# Patient Record
Sex: Male | Born: 1955
Health system: Southern US, Community
[De-identification: ages and names within clinical notes are randomized; demographics above are authoritative.]

## PROBLEM LIST (undated history)

## (undated) DIAGNOSIS — E785 Hyperlipidemia, unspecified: Secondary | ICD-10-CM

## (undated) DIAGNOSIS — H5440 Blindness, one eye, unspecified eye: Secondary | ICD-10-CM

## (undated) DIAGNOSIS — K219 Gastro-esophageal reflux disease without esophagitis: Secondary | ICD-10-CM

## (undated) DIAGNOSIS — G629 Polyneuropathy, unspecified: Secondary | ICD-10-CM

## (undated) DIAGNOSIS — R7402 Elevation of levels of lactic acid dehydrogenase (LDH): Secondary | ICD-10-CM

## (undated) DIAGNOSIS — Z87442 Personal history of urinary calculi: Secondary | ICD-10-CM

## (undated) DIAGNOSIS — R7401 Elevation of levels of liver transaminase levels: Secondary | ICD-10-CM

## (undated) DIAGNOSIS — M7501 Adhesive capsulitis of right shoulder: Secondary | ICD-10-CM

## (undated) DIAGNOSIS — M1990 Unspecified osteoarthritis, unspecified site: Secondary | ICD-10-CM

## (undated) DIAGNOSIS — S83232A Complex tear of medial meniscus, current injury, left knee, initial encounter: Secondary | ICD-10-CM

## (undated) DIAGNOSIS — M549 Dorsalgia, unspecified: Secondary | ICD-10-CM

## (undated) DIAGNOSIS — T7840XA Allergy, unspecified, initial encounter: Secondary | ICD-10-CM

## (undated) DIAGNOSIS — I10 Essential (primary) hypertension: Secondary | ICD-10-CM

## (undated) DIAGNOSIS — Z973 Presence of spectacles and contact lenses: Secondary | ICD-10-CM

## (undated) DIAGNOSIS — R740 Nonspecific elevation of levels of transaminase and lactic acid dehydrogenase [LDH]: Secondary | ICD-10-CM

## (undated) HISTORY — DX: Personal history of urinary calculi: Z87.442

## (undated) HISTORY — DX: Gastro-esophageal reflux disease without esophagitis: K21.9

## (undated) HISTORY — DX: Hyperlipidemia, unspecified: E78.5

## (undated) HISTORY — DX: Dorsalgia, unspecified: M54.9

## (undated) HISTORY — DX: Elevation of levels of lactic acid dehydrogenase (LDH): R74.02

## (undated) HISTORY — DX: Elevation of levels of liver transaminase levels: R74.01

## (undated) HISTORY — DX: Allergy, unspecified, initial encounter: T78.40XA

## (undated) HISTORY — DX: Nonspecific elevation of levels of transaminase and lactic acid dehydrogenase (ldh): R74.0

## (undated) HISTORY — PX: SHOULDER ARTHROSCOPY WITH ROTATOR CUFF REPAIR: SHX5685

## (undated) HISTORY — DX: Essential (primary) hypertension: I10

## (undated) HISTORY — PX: KNEE ARTHROSCOPY W/ MENISCAL REPAIR: SHX1877

## (undated) HISTORY — PX: COLONOSCOPY: SHX174

## (undated) HISTORY — PX: COSMETIC SURGERY: SHX468

## (undated) HISTORY — PX: SPINE SURGERY: SHX786

## (undated) HISTORY — PX: EYE SURGERY: SHX253

## (undated) HISTORY — PX: UPPER GI ENDOSCOPY: SHX6162

---
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
## 1988-12-06 HISTORY — PX: LUMBAR DISC SURGERY: SHX700

## 1998-10-22 ENCOUNTER — Ambulatory Visit (HOSPITAL_COMMUNITY): Admission: RE | Admit: 1998-10-22 | Discharge: 1998-10-22 | Payer: Self-pay | Admitting: Gastroenterology

## 1999-11-25 ENCOUNTER — Ambulatory Visit (HOSPITAL_COMMUNITY): Admission: RE | Admit: 1999-11-25 | Discharge: 1999-11-25 | Payer: Self-pay | Admitting: Neurosurgery

## 1999-11-25 ENCOUNTER — Encounter: Payer: Self-pay | Admitting: Neurosurgery

## 2000-08-31 ENCOUNTER — Encounter: Payer: Self-pay | Admitting: Gastroenterology

## 2000-08-31 ENCOUNTER — Ambulatory Visit (HOSPITAL_COMMUNITY): Admission: RE | Admit: 2000-08-31 | Discharge: 2000-08-31 | Payer: Self-pay | Admitting: Gastroenterology

## 2001-05-15 ENCOUNTER — Emergency Department (HOSPITAL_COMMUNITY): Admission: EM | Admit: 2001-05-15 | Discharge: 2001-05-15 | Payer: Self-pay | Admitting: Emergency Medicine

## 2001-05-15 ENCOUNTER — Encounter: Payer: Self-pay | Admitting: *Deleted

## 2001-05-16 ENCOUNTER — Encounter: Payer: Self-pay | Admitting: Urology

## 2001-05-16 ENCOUNTER — Encounter: Admission: RE | Admit: 2001-05-16 | Discharge: 2001-05-16 | Payer: Self-pay | Admitting: Urology

## 2001-05-17 ENCOUNTER — Emergency Department (HOSPITAL_COMMUNITY): Admission: EM | Admit: 2001-05-17 | Discharge: 2001-05-17 | Payer: Self-pay | Admitting: Emergency Medicine

## 2001-05-18 ENCOUNTER — Ambulatory Visit (HOSPITAL_COMMUNITY): Admission: RE | Admit: 2001-05-18 | Discharge: 2001-05-18 | Payer: Self-pay | Admitting: Urology

## 2001-05-18 ENCOUNTER — Encounter: Payer: Self-pay | Admitting: Urology

## 2001-05-20 ENCOUNTER — Encounter: Payer: Self-pay | Admitting: Emergency Medicine

## 2001-05-20 ENCOUNTER — Emergency Department (HOSPITAL_COMMUNITY): Admission: EM | Admit: 2001-05-20 | Discharge: 2001-05-20 | Payer: Self-pay | Admitting: *Deleted

## 2001-05-22 ENCOUNTER — Encounter: Admission: RE | Admit: 2001-05-22 | Discharge: 2001-05-22 | Payer: Self-pay | Admitting: Urology

## 2001-05-22 ENCOUNTER — Encounter: Payer: Self-pay | Admitting: Urology

## 2001-05-24 ENCOUNTER — Encounter: Payer: Self-pay | Admitting: Urology

## 2001-05-24 ENCOUNTER — Ambulatory Visit (HOSPITAL_COMMUNITY): Admission: RE | Admit: 2001-05-24 | Discharge: 2001-05-24 | Payer: Self-pay | Admitting: Urology

## 2001-06-15 ENCOUNTER — Encounter: Payer: Self-pay | Admitting: Urology

## 2001-06-15 ENCOUNTER — Encounter: Admission: RE | Admit: 2001-06-15 | Discharge: 2001-06-15 | Payer: Self-pay | Admitting: Urology

## 2005-01-25 ENCOUNTER — Ambulatory Visit: Payer: Self-pay | Admitting: Internal Medicine

## 2005-01-27 ENCOUNTER — Ambulatory Visit: Payer: Self-pay | Admitting: Internal Medicine

## 2005-04-27 ENCOUNTER — Ambulatory Visit: Payer: Self-pay | Admitting: Internal Medicine

## 2005-06-02 ENCOUNTER — Ambulatory Visit: Payer: Self-pay | Admitting: Internal Medicine

## 2005-06-30 ENCOUNTER — Ambulatory Visit: Payer: Self-pay | Admitting: Internal Medicine

## 2005-09-21 ENCOUNTER — Ambulatory Visit: Payer: Self-pay | Admitting: Internal Medicine

## 2005-09-29 ENCOUNTER — Ambulatory Visit: Payer: Self-pay | Admitting: Internal Medicine

## 2005-10-08 ENCOUNTER — Ambulatory Visit: Payer: Self-pay

## 2006-01-26 ENCOUNTER — Ambulatory Visit: Payer: Self-pay | Admitting: Family Medicine

## 2006-05-05 ENCOUNTER — Emergency Department (HOSPITAL_COMMUNITY): Admission: EM | Admit: 2006-05-05 | Discharge: 2006-05-05 | Payer: Self-pay | Admitting: Emergency Medicine

## 2006-05-30 ENCOUNTER — Ambulatory Visit: Payer: Self-pay | Admitting: Internal Medicine

## 2006-06-01 ENCOUNTER — Ambulatory Visit: Payer: Self-pay | Admitting: Internal Medicine

## 2006-08-29 ENCOUNTER — Ambulatory Visit: Payer: Self-pay | Admitting: Internal Medicine

## 2006-09-16 ENCOUNTER — Ambulatory Visit: Payer: Self-pay | Admitting: Internal Medicine

## 2006-12-08 ENCOUNTER — Ambulatory Visit: Payer: Self-pay | Admitting: Internal Medicine

## 2007-02-21 ENCOUNTER — Ambulatory Visit: Payer: Self-pay | Admitting: Internal Medicine

## 2007-02-21 LAB — CONVERTED CEMR LAB
AST: 29 units/L (ref 0–37)
Albumin: 3.8 g/dL (ref 3.5–5.2)
Alkaline Phosphatase: 44 units/L (ref 39–117)
Bilirubin, Direct: 0.2 mg/dL (ref 0.0–0.3)
Cholesterol: 138 mg/dL (ref 0–200)
Hgb A1c MFr Bld: 7.5 % — ABNORMAL HIGH (ref 4.6–6.0)
Potassium: 4.9 meq/L (ref 3.5–5.1)
Total Protein: 7.1 g/dL (ref 6.0–8.3)
Triglycerides: 92 mg/dL (ref 0–149)

## 2007-02-22 ENCOUNTER — Ambulatory Visit: Payer: Self-pay | Admitting: Internal Medicine

## 2007-05-18 ENCOUNTER — Ambulatory Visit: Payer: Self-pay | Admitting: Internal Medicine

## 2007-05-18 LAB — CONVERTED CEMR LAB
ALT: 58 units/L — ABNORMAL HIGH (ref 0–40)
AST: 38 units/L — ABNORMAL HIGH (ref 0–37)
Albumin: 3.9 g/dL (ref 3.5–5.2)
Alkaline Phosphatase: 39 units/L (ref 39–117)
BUN: 17 mg/dL (ref 6–23)
Bilirubin, Direct: 0.2 mg/dL (ref 0.0–0.3)
Cholesterol: 160 mg/dL (ref 0–200)
Creatinine, Ser: 1 mg/dL (ref 0.4–1.5)
HDL: 51.6 mg/dL (ref >39.0)
Hgb A1c MFr Bld: 7.4 % — ABNORMAL HIGH (ref 4.6–6.0)
LDL Cholesterol: 96 mg/dL (ref 0–99)
Potassium: 4.7 meq/L (ref 3.5–5.1)
Total Bilirubin: 1.4 mg/dL — ABNORMAL HIGH (ref 0.3–1.2)
Total CHOL/HDL Ratio: 3.1
Total Protein: 7.3 g/dL (ref 6.0–8.3)
Triglycerides: 62 mg/dL (ref 0–149)
VLDL: 12 mg/dL (ref 0–40)

## 2007-05-24 ENCOUNTER — Ambulatory Visit: Payer: Self-pay | Admitting: Internal Medicine

## 2007-08-11 ENCOUNTER — Ambulatory Visit: Payer: Self-pay | Admitting: Internal Medicine

## 2007-08-11 LAB — CONVERTED CEMR LAB
HDL: 48.8 mg/dL (ref >39.0)
Hgb A1c MFr Bld: 6.9 %
LDL Cholesterol: 79 mg/dL (ref 0–99)
Potassium: 4 meq/L (ref 3.5–5.1)
Total Protein: 7.1 g/dL (ref 6.0–8.3)
Triglycerides: 53 mg/dL (ref 0–149)

## 2007-08-23 ENCOUNTER — Ambulatory Visit: Payer: Self-pay | Admitting: Internal Medicine

## 2007-09-22 ENCOUNTER — Ambulatory Visit: Payer: Self-pay | Admitting: Internal Medicine

## 2007-09-22 ENCOUNTER — Encounter: Payer: Self-pay | Admitting: Internal Medicine

## 2007-09-22 DIAGNOSIS — R7401 Elevation of levels of liver transaminase levels: Secondary | ICD-10-CM | POA: Insufficient documentation

## 2007-09-22 DIAGNOSIS — E785 Hyperlipidemia, unspecified: Secondary | ICD-10-CM | POA: Insufficient documentation

## 2007-09-22 DIAGNOSIS — Z87442 Personal history of urinary calculi: Secondary | ICD-10-CM | POA: Insufficient documentation

## 2007-09-22 DIAGNOSIS — R740 Nonspecific elevation of levels of transaminase and lactic acid dehydrogenase [LDH]: Secondary | ICD-10-CM

## 2007-09-22 DIAGNOSIS — K219 Gastro-esophageal reflux disease without esophagitis: Secondary | ICD-10-CM | POA: Insufficient documentation

## 2007-09-22 DIAGNOSIS — M549 Dorsalgia, unspecified: Secondary | ICD-10-CM | POA: Insufficient documentation

## 2007-09-22 DIAGNOSIS — R7402 Elevation of levels of lactic acid dehydrogenase (LDH): Secondary | ICD-10-CM | POA: Insufficient documentation

## 2007-09-22 LAB — CONVERTED CEMR LAB
ALT: 49 units/L (ref 0–53)
AST: 32 units/L (ref 0–37)
Albumin: 3.7 g/dL (ref 3.5–5.2)
Bilirubin, Direct: 0.2 mg/dL (ref 0.0–0.3)
HDL: 44.3 mg/dL (ref >39.0)
LDL Cholesterol: 75 mg/dL (ref 0–99)
VLDL: 27 mg/dL (ref 0–40)

## 2007-11-29 ENCOUNTER — Ambulatory Visit: Payer: Self-pay | Admitting: Internal Medicine

## 2007-11-29 DIAGNOSIS — E119 Type 2 diabetes mellitus without complications: Secondary | ICD-10-CM | POA: Insufficient documentation

## 2007-11-29 LAB — CONVERTED CEMR LAB
ALT: 89 units/L — ABNORMAL HIGH (ref 0–53)
Alkaline Phosphatase: 47 units/L (ref 39–117)
BUN: 17 mg/dL (ref 6–23)
Bilirubin, Direct: 0.2 mg/dL (ref 0.0–0.3)
Creatinine, Ser: 0.9 mg/dL (ref 0.4–1.5)
HDL: 46.5 mg/dL (ref >39.0)
Total Bilirubin: 1.2 mg/dL (ref 0.3–1.2)
Total Protein: 7.5 g/dL (ref 6.0–8.3)
VLDL: 21 mg/dL (ref 0–40)

## 2007-12-05 ENCOUNTER — Ambulatory Visit: Payer: Self-pay | Admitting: Internal Medicine

## 2008-02-29 ENCOUNTER — Ambulatory Visit: Payer: Self-pay | Admitting: Internal Medicine

## 2008-02-29 LAB — CONVERTED CEMR LAB
BUN: 14 mg/dL (ref 6–23)
Calcium: 8.9 mg/dL (ref 8.4–10.5)
Glucose, Bld: 137 mg/dL — ABNORMAL HIGH (ref 70–99)
HDL: 47.3 mg/dL (ref >39.0)
Total CHOL/HDL Ratio: 4.7

## 2008-03-01 ENCOUNTER — Ambulatory Visit: Payer: Self-pay | Admitting: Internal Medicine

## 2008-03-12 ENCOUNTER — Encounter: Payer: Self-pay | Admitting: Internal Medicine

## 2008-03-13 ENCOUNTER — Encounter: Admission: RE | Admit: 2008-03-13 | Discharge: 2008-03-13 | Payer: Self-pay | Admitting: Gastroenterology

## 2008-04-02 ENCOUNTER — Encounter: Payer: Self-pay | Admitting: Internal Medicine

## 2008-05-16 ENCOUNTER — Encounter: Payer: Self-pay | Admitting: Internal Medicine

## 2008-06-21 ENCOUNTER — Ambulatory Visit: Payer: Self-pay | Admitting: Internal Medicine

## 2008-06-21 LAB — CONVERTED CEMR LAB
BUN: 13 mg/dL (ref 6–23)
Bilirubin, Direct: 0.2 mg/dL (ref 0.0–0.3)
CO2: 30 meq/L (ref 19–32)
Calcium: 9.3 mg/dL (ref 8.4–10.5)
Chloride: 104 meq/L (ref 96–112)
Creatinine, Ser: 0.9 mg/dL (ref 0.4–1.5)
GFR calc Af Amer: 114 mL/min
GFR calc non Af Amer: 94 mL/min
Glucose, Bld: 137 mg/dL — ABNORMAL HIGH (ref 70–99)
Total Bilirubin: 1.4 mg/dL — ABNORMAL HIGH (ref 0.3–1.2)
Total Protein: 7.1 g/dL (ref 6.0–8.3)
Triglycerides: 131 mg/dL (ref 0–149)

## 2008-06-26 ENCOUNTER — Encounter: Payer: Self-pay | Admitting: Internal Medicine

## 2008-06-27 ENCOUNTER — Ambulatory Visit: Payer: Self-pay | Admitting: Internal Medicine

## 2008-10-14 ENCOUNTER — Ambulatory Visit: Payer: Self-pay | Admitting: Internal Medicine

## 2008-10-14 LAB — CONVERTED CEMR LAB
ALT: 79 units/L — ABNORMAL HIGH (ref 0–53)
Alkaline Phosphatase: 45 units/L (ref 39–117)
Calcium: 9.1 mg/dL (ref 8.4–10.5)
Chloride: 104 meq/L (ref 96–112)
Cholesterol: 218 mg/dL (ref 0–200)
Creatinine, Ser: 1 mg/dL (ref 0.4–1.5)
Direct LDL: 142.5 mg/dL
GFR calc Af Amer: 100 mL/min
GFR calc non Af Amer: 83 mL/min
HDL: 47 mg/dL (ref >39.0)
Sodium: 142 meq/L (ref 135–145)
Total Protein: 7.6 g/dL (ref 6.0–8.3)
Triglycerides: 120 mg/dL (ref 0–149)

## 2008-10-22 ENCOUNTER — Ambulatory Visit: Payer: Self-pay | Admitting: Internal Medicine

## 2008-10-28 ENCOUNTER — Ambulatory Visit: Payer: Self-pay | Admitting: Internal Medicine

## 2008-10-28 ENCOUNTER — Telehealth: Payer: Self-pay | Admitting: Internal Medicine

## 2008-11-04 ENCOUNTER — Telehealth: Payer: Self-pay | Admitting: Internal Medicine

## 2008-11-18 DIAGNOSIS — I10 Essential (primary) hypertension: Secondary | ICD-10-CM | POA: Insufficient documentation

## 2008-11-28 ENCOUNTER — Ambulatory Visit: Payer: Self-pay | Admitting: Internal Medicine

## 2009-01-20 ENCOUNTER — Ambulatory Visit: Payer: Self-pay | Admitting: Internal Medicine

## 2009-01-20 LAB — CONVERTED CEMR LAB
ALT: 61 units/L — ABNORMAL HIGH (ref 0–53)
HDL: 51.5 mg/dL (ref >39.0)
LDL Cholesterol: 119 mg/dL — ABNORMAL HIGH (ref 0–99)
Total Bilirubin: 1 mg/dL (ref 0.3–1.2)

## 2009-01-22 ENCOUNTER — Encounter: Payer: Self-pay | Admitting: Internal Medicine

## 2009-01-22 ENCOUNTER — Ambulatory Visit: Payer: Self-pay | Admitting: Endocrinology

## 2009-01-24 ENCOUNTER — Ambulatory Visit: Payer: Self-pay

## 2009-01-24 ENCOUNTER — Encounter: Payer: Self-pay | Admitting: Endocrinology

## 2009-01-27 ENCOUNTER — Encounter: Payer: Self-pay | Admitting: Internal Medicine

## 2009-01-27 ENCOUNTER — Ambulatory Visit: Payer: Self-pay | Admitting: Cardiology

## 2009-01-30 ENCOUNTER — Telehealth: Payer: Self-pay | Admitting: Endocrinology

## 2009-06-11 ENCOUNTER — Ambulatory Visit: Payer: Self-pay | Admitting: Internal Medicine

## 2009-06-11 LAB — CONVERTED CEMR LAB
BUN: 16 mg/dL (ref 6–23)
CO2: 32 meq/L (ref 19–32)
Creatinine, Ser: 0.9 mg/dL (ref 0.4–1.5)
HDL: 48.3 mg/dL (ref >39.00)
LDL Cholesterol: 94 mg/dL (ref 0–99)
Potassium: 4.6 meq/L (ref 3.5–5.1)
Total CHOL/HDL Ratio: 3
VLDL: 22 mg/dL (ref 0.0–40.0)

## 2009-06-19 ENCOUNTER — Ambulatory Visit: Payer: Self-pay | Admitting: Internal Medicine

## 2009-06-20 DIAGNOSIS — E1149 Type 2 diabetes mellitus with other diabetic neurological complication: Secondary | ICD-10-CM | POA: Insufficient documentation

## 2009-07-07 ENCOUNTER — Ambulatory Visit: Payer: Self-pay | Admitting: Internal Medicine

## 2009-07-14 ENCOUNTER — Encounter: Payer: Self-pay | Admitting: Internal Medicine

## 2009-10-10 ENCOUNTER — Ambulatory Visit: Payer: Self-pay | Admitting: Internal Medicine

## 2009-10-10 LAB — CONVERTED CEMR LAB
ALT: 50 units/L (ref 0–53)
AST: 34 units/L (ref 0–37)
Albumin: 4 g/dL (ref 3.5–5.2)
Alkaline Phosphatase: 42 units/L (ref 39–117)
LDL Cholesterol: 107 mg/dL — ABNORMAL HIGH (ref 0–99)
Total Bilirubin: 1.4 mg/dL — ABNORMAL HIGH (ref 0.3–1.2)
Total CHOL/HDL Ratio: 4
VLDL: 20 mg/dL (ref 0.0–40.0)

## 2009-10-13 ENCOUNTER — Ambulatory Visit: Payer: Self-pay | Admitting: Internal Medicine

## 2009-10-13 LAB — CONVERTED CEMR LAB
Hemoglobin, Urine: NEGATIVE
Leukocytes, UA: NEGATIVE
Specific Gravity, Urine: <=1.005 (ref 1.000–1.030)
Total Protein, Urine: NEGATIVE mg/dL
pH: 6 (ref 5.0–8.0)

## 2009-10-15 ENCOUNTER — Telehealth: Payer: Self-pay | Admitting: Internal Medicine

## 2009-10-20 ENCOUNTER — Telehealth: Payer: Self-pay | Admitting: Internal Medicine

## 2009-11-19 ENCOUNTER — Telehealth: Payer: Self-pay | Admitting: Internal Medicine

## 2010-02-06 ENCOUNTER — Ambulatory Visit: Payer: Self-pay | Admitting: Internal Medicine

## 2010-02-06 ENCOUNTER — Telehealth: Payer: Self-pay | Admitting: Internal Medicine

## 2010-02-09 LAB — CONVERTED CEMR LAB
AST: 40 units/L — ABNORMAL HIGH (ref 0–37)
Albumin: 3.9 g/dL (ref 3.5–5.2)
Alkaline Phosphatase: 40 units/L (ref 39–117)
Cholesterol: 142 mg/dL (ref 0–200)
HDL: 50.6 mg/dL (ref >39.00)
Total Protein: 7.5 g/dL (ref 6.0–8.3)
Triglycerides: 52 mg/dL (ref 0.0–149.0)

## 2010-02-20 ENCOUNTER — Ambulatory Visit: Payer: Self-pay | Admitting: Internal Medicine

## 2010-04-15 ENCOUNTER — Ambulatory Visit (HOSPITAL_COMMUNITY): Admission: RE | Admit: 2010-04-15 | Discharge: 2010-04-15 | Payer: Self-pay | Admitting: Ophthalmology

## 2010-05-17 ENCOUNTER — Encounter: Payer: Self-pay | Admitting: Internal Medicine

## 2010-10-09 ENCOUNTER — Ambulatory Visit: Payer: Self-pay | Admitting: Internal Medicine

## 2010-10-09 LAB — CONVERTED CEMR LAB
CO2: 31 meq/L (ref 19–32)
Calcium: 9.5 mg/dL (ref 8.4–10.5)
Chloride: 101 meq/L (ref 96–112)
GFR calc non Af Amer: 93.92 mL/min (ref >60)
HDL: 48.5 mg/dL (ref >39.00)
Potassium: 5 meq/L (ref 3.5–5.1)
Sodium: 140 meq/L (ref 135–145)

## 2010-10-15 ENCOUNTER — Ambulatory Visit: Payer: Self-pay | Admitting: Internal Medicine

## 2011-01-05 NOTE — Assessment & Plan Note (Signed)
Summary: FU ON LABS / $50 /NWS   Vital Signs:  Patient Profile:   55 Years Old Male Height:     72 inches Weight:      202 pounds Temp:     98.8 degrees F oral Pulse rate:   69 / minute BP sitting:   128 / 72  (left arm) Cuff size:   large  Vitals Entered By: Zackery Barefoot CMA (March 01, 2008 3:20 PM)                 PCP:  Mio Schellinger  Chief Complaint:  Follow up.  History of Present Illness: 1.Lipid - doing better with red yeast rice 600 mg two times a day. Still with some discomfort 2. DM A1C 7.1% Good 3.Ortho - left shoulder pain - probable torn rotator cuff. Sees Dr. Ophelia Charter.                left knee pain and having cortisone injections. Probable torn miniscus    Current Allergies: CODEINE      Physical Exam  General:     Well-developed,well-nourished,in no acute distress; alert,appropriate and cooperative throughout examination Head:     Normocephalic and atraumatic without obvious abnormalities. No apparent alopecia or balding. Eyes:     prosthetic globe left, right eye appears normal Lungs:     normal respiratory effort, no intercostal retractions, and normal breath sounds.   Heart:     normal rate and regular rhythm.      Impression & Recommendations:  Problem # 1:  AODM (ICD-250.00)  His updated medication list for this problem includes:    Adult Aspirin Low Strength 81 Mg Tbdp (Aspirin) ..... By mouth once daily    Altace 10 Mg Tabs (Ramipril) ..... By mouth once daily    Amaryl 2 Mg Tabs (Glimepiride) ..... By mouth once daily    Actos 15 Mg Tabs (Pioglitazone hcl) ..... By mouth once daily  Labs Reviewed: HgBA1c: 7.3 (11/29/2007)   Creat: 0.9 (11/29/2007)  ; 7.1% ( 02/29/08)  Suboptimal A1C with goal of 7% or less. Plan: continue present meds- recheck in 4 months   Problem # 2:  HYPERLIPIDEMIA (ICD-272.4)  Labs Reviewed: Chol: 240 (11/29/2007)   HDL: 46.5 (11/29/2007)   LDL: DEL (11/29/2007)   TG: 103 (11/29/2007) SGOT: 42  (11/29/2007)   SGPT: 89 (11/29/2007)  chol 221; HDL 47.l3, LDL 147.3, tgy 89.  Patient taking red yeast rice 600mg  two times a day; Plan; increase red yeast rice to 600mg  three times a day, advane to 1200mg  two times a day if tolerated. Recheck lipids 4 months.  Complete Medication List: 1)  Adult Aspirin Low Strength 81 Mg Tbdp (Aspirin) .... By mouth once daily 2)  Altace 10 Mg Tabs (Ramipril) .... By mouth once daily 3)  Amaryl 2 Mg Tabs (Glimepiride) .... By mouth once daily 4)  Actos 15 Mg Tabs (Pioglitazone hcl) .... By mouth once daily 5)  Etodolac 500 Mg Tabs (Etodolac) .... By mouth once daily 6)  Omeprazole 20 Mg Tbec (Omeprazole) .... 2 by mouth at bedtime 7)  Magnesium Oxide 400 Mg Caps (Magnesium oxide) .... By mouth two times a day 8)  Levitra 10 Mg Tabs (Vardenafil hcl) .... By mouth as needed 9)  Red Yeast Rice 600 Mg Caps (Red yeast rice extract) .Marland Kitchen.. 1 by mouth two times a day     ]

## 2011-01-05 NOTE — Letter (Signed)
Summary: Surgical clearance/[facility]  Surgical clearance/[facility]   Imported By: Lester [place] 05/20/2010 08:17:36  _____________________________________________________________________  External Attachment:    Type:   Image     Comment:   External Document

## 2011-01-05 NOTE — Assessment & Plan Note (Signed)
Summary: FU ON DM/NWS  #   Vital Signs:  Patient profile:   55 year old male Height:      72 inches Weight:      191 pounds BMI:     26.00 O2 Sat:      97 % on Room air Temp:     97.7 degrees F oral Pulse rate:   64 / minute BP sitting:   110 / 60  (left arm) Cuff size:   regular  Vitals Entered By: Bill Salinas CMA (February 20, 2010 2:59 PM)  O2 Flow:  Room air CC: follow-up visit/ ab   Primary Care Provider:  [facility]  CC:  follow-up visit/ ab.  History of Present Illness: Patient presents for follow up  diabetes and lipids. He reports that he has had persistent foot pain that is getting worse and is not controlled with gabapentin 100mg  at bedtime. He c/o pain the right hand at the 3rd metacarpal and MCP joint. He reports a non-productive cough but no fever, SOB, myalgias or fatigue.   Current Medications (verified): 1)  Adult Aspirin Low Strength 81 Mg  Tbdp (Aspirin) .... By Mouth Once Daily 2)  Altace 10 Mg  Tabs (Ramipril) .... By Mouth Once Daily 3)  Amaryl 2 Mg  Tabs (Glimepiride) .... By Mouth Once Daily 4)  Actos 15 Mg  Tabs (Pioglitazone Hcl) .... By Mouth Once Daily 5)  Etodolac 500 Mg  Tabs (Etodolac) .Marland Kitchen.. 1 Two Times A Day 6)  Omeprazole 20 Mg Cpdr (Omeprazole) .... Take 1 Tablet By Mouth Two Times A Day 7)  Levitra 10 Mg  Tabs (Vardenafil Hcl) .... By Mouth As Needed 8)  Multivitamins   Tabs (Multiple Vitamin) .... Take 1 Tablet By Mouth Once A Day 9)  Gabapentin 100 Mg Caps (Gabapentin) .Marland Kitchen.. 1 By Mouth At Bedtime 10)  Co Q-10 200 Mg Caps (Coenzyme Q10) .... Take 1 Tablet By Mouth Once A Day 11)  Pravastatin Sodium 40 Mg Tabs (Pravastatin Sodium) .Marland Kitchen.. 1 Q Pm 12)  Metanx 13)  Cyclobenzaprine Hcl 5 Mg Tabs (Cyclobenzaprine Hcl) .Marland Kitchen.. 1 By Mouth Q 6 Hours As Needed For "musle Twitching"  Allergies (verified): 1)  Codeine  Past History:  Past Medical History: Last updated: 11/18/2008 NEPHROLITHIASIS, HX OF (ICD-V13.01) HYPERTENSION, UNSPECIFIED  (ICD-401.9) AODM (ICD-250.00) * L-4-5 LUMBAR DISKECTOMY * RECONSTRUCTIVE FACIAL SURGERY OF THE LEFT ORBIT ELEVATION, TRANSAMINASE/LDH LEVELS (ICD-790.4) Hx of BACK PAIN (ICD-724.5) HYPERLIPIDEMIA (ICD-272.4) GERD (ICD-530.81) NEPHROLITHIASIS, HX OF (ICD-V13.01)   Physician Roster:        GI Randa Evens        NS - Roy       Opthal - Sydnor ()       Lipid Management:  Jimmey Ralph    Past Surgical History: Last updated: 09/22/2007 * RECONSTRUCTIVE FACIAL SURGERY OF THE LEFT ORBIT * L-4-5 LUMBAR DISKECTOMY  Family History: Last updated: January 04, 2008 father - died, CVA, PVD mother - died, CAD  Social History: Last updated: 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  The patient denies anorexia, fever, weight loss, weight gain, hoarseness, chest pain, dyspnea on exertion, hemoptysis, abdominal pain, muscle weakness, unusual weight change, enlarged lymph nodes, and angioedema.    Physical Exam  General:  Well-developed,well-nourished,in no acute distress; alert,appropriate and cooperative throughout examination Head:  normocephalic and atraumatic.  Eyes:  cornea and lense clear and no injection of his single eye.   Neck:  full ROM.   Lungs:  normal respiratory effort, normal breath sounds, no crackles, and no wheezes.   Heart:  normal rate, regular rhythm, and no murmur.   Msk:  no joint tenderness, no joint swelling, no joint warmth, no redness over joints, and no joint deformities.   Neurologic:  alert & oriented X3, cranial nerves II-XII intact, strength normal in all extremities, and gait normal.   Skin:  turgor normal, color normal, and no suspicious lesions.   Cervical Nodes:  no anterior cervical adenopathy and no posterior cervical adenopathy.   Psych:  Oriented X3, memory intact for recent and  remote, and normally interactive.     Impression & Recommendations:  Problem # 1:  DIABETES MELLITUS, TYPE II, WITH NEUROLOGICAL COMPLICATIONS (ICD-250.60)  His updated medication list for this problem includes:    Adult Aspirin Low Strength 81 Mg Tbdp (Aspirin) ..... By mouth once daily    Altace 10 Mg Tabs (Ramipril) ..... By mouth once daily    Amaryl 2 Mg Tabs (Glimepiride) ..... By mouth once daily    Actos 15 Mg Tabs (Pioglitazone hcl) ..... By mouth once daily  Labs Reviewed: Creat: 0.9 (06/11/2009)    Reviewed HgBA1c results: 6.8 (02/06/2010)  6.8 (10/10/2009)  Good control. Continue present regimen  Problem # 2:  HYPERTENSION, UNSPECIFIED (ICD-401.9)  His updated medication list for this problem includes:    Altace 10 Mg Tabs (Ramipril) ..... By mouth once daily  BP today: 110/60 Prior BP: 132/70 (10/13/2009)  Labs Reviewed: K+: 4.6 (06/11/2009) Creat: : 0.9 (06/11/2009)   Chol: 142 (02/06/2010)   HDL: 50.60 (02/06/2010)   LDL: 81 (02/06/2010)   TG: 52.0 (02/06/2010)  Good control - continue present meds  Problem # 3:  FOOT PAIN, RIGHT (ICD-729.5) Question of peripheral neuropathy vs OA changes.  Plan - increase gabapentin to 200mg  at bedtime and may increase to 300mg  if needed.   Problem # 4:  HYPERLIPIDEMIA (ICD-272.4)  His updated medication list for this problem includes:    Pravastatin Sodium 40 Mg Tabs (Pravastatin sodium) .Marland Kitchen... 1 q pm  Labs Reviewed: SGOT: 40 (02/06/2010)   SGPT: 75 (02/06/2010)   HDL:50.60 (02/06/2010), 48.20 (10/10/2009)  LDL:81 (02/06/2010), 107 (16/09/9603)  Chol:142 (02/06/2010), 175 (10/10/2009)  Trig:52.0 (02/06/2010), 100.0 (10/10/2009)  Great control  Problem # 5:  HAND PAIN, RIGHT (ICD-729.5) right hand pain. No deformity noted, swelling or erythema. Strain vs OA  Plan - may use otc tylenol or anti-inflammatory meds as needed.  Problem # 6:  COUGH (ICD-786.2) no evidence of infection on exam or by history. May be  allergic in nature  Plan - benzonatate perle 100mg  three times a day as needed   Problem # 7:  GERD (ICD-530.81) Stable on present medication.   His updated medication list for this problem includes:    Omeprazole 20 Mg Cpdr (Omeprazole) .Marland Kitchen... Take 1 tablet by mouth two times a day  Complete Medication List: 1)  Adult Aspirin Low Strength 81 Mg Tbdp (Aspirin) .... By mouth once daily 2)  Altace 10 Mg Tabs (Ramipril) .... By mouth once daily 3)  Amaryl 2 Mg Tabs (Glimepiride) .... By mouth once daily 4)  Actos 15 Mg Tabs (Pioglitazone hcl) .... By mouth once daily 5)  Etodolac 500 Mg Tabs (Etodolac) .Marland Kitchen.. 1 two times a day 6)  Omeprazole 20 Mg Cpdr (Omeprazole) .... Take 1 tablet by mouth two times  a day 7)  Levitra 10 Mg Tabs (Vardenafil hcl) .... By mouth as needed 8)  Multivitamins Tabs (Multiple vitamin) .... Take 1 tablet by mouth once a day 9)  Gabapentin 100 Mg Caps (Gabapentin) .Marland Kitchen.. 1 by mouth at bedtime 10)  Co Q-10 200 Mg Caps (Coenzyme q10) .... Take 1 tablet by mouth once a day 11)  Pravastatin Sodium 40 Mg Tabs (Pravastatin sodium) .Marland Kitchen.. 1 q pm 12)  Metanx  13)  Cyclobenzaprine Hcl 5 Mg Tabs (Cyclobenzaprine hcl) .Marland Kitchen.. 1 by mouth q 6 hours as needed for "musle twitching" 14)  Benzonatate 100 Mg Caps (Benzonatate) .Marland Kitchen.. 1 by mouth three times a day for cough  Patient: Ian Johnson Note: All result statuses are Final unless otherwise noted.  Tests: (1) Hemoglobin A1C (A1C)   Hemoglobin A1C       [H]  6.8 %                       4.6-6.5     Glycemic Control Guidelines for People with Diabetes:     Non Diabetic:  <6%     Goal of Therapy: <7%     Additional Action Suggested:  >8%   Tests: (2) Lipid Panel (LIPID)   Cholesterol               142 mg/dL                   5-409     ATP III Classification            Desirable:  < 200 mg/dL                    Borderline High:  200 - 239 mg/dL               High:  > = 240 mg/dL   Triglycerides             52.0 mg/dL                   8.1-191.4     Normal:  <150 mg/dL     Borderline High:  782 - 199 mg/dL   HDL                       95.62 mg/dL                 >13.08   VLDL Cholesterol          10.4 mg/dL                  6.5-78.4   LDL Cholesterol           81 mg/dL                    6-96  CHO/HDL Ratio:  CHD Risk                             3                    Men          Women     1/2 Average Risk     3.4          3.3     Average Risk          5.0  4.4     2X Average Risk          9.6          7.1     3X Average Risk          15.0          11.0                           Tests: (3) Hepatic/Liver Function Panel (HEPATIC)   Total Bilirubin           1.0 mg/dL                   3.7-1.0   Direct Bilirubin          0.2 mg/dL                   6.2-6.9   Alkaline Phosphatase      40 U/L                      39-117   AST                  [H]  40 U/L                      0-37   ALT                  [H]  75 U/L                      0-53   Total Protein             7.5 g/dL                    4.8-5.4   Albumin                   3.9 g/dL                    [id number]: BENZONATATE 100 MG CAPS (BENZONATATE) 1 by mouth three times a day for cough  #30 x 1   Entered and Authorized by:   Jacques Navy MD   Signed by:   Jacques Navy MD on 02/20/2010   Method used:   Print then Give to Patient   RxID:   [id number] PRAVASTATIN SODIUM 40 MG TABS (PRAVASTATIN SODIUM) 1 q PM  #90 x 3   Entered and Authorized by:   Jacques Navy MD   Signed by:   Jacques Navy MD on 02/20/2010   Method used:   Print then Give to Patient   RxID:   [id number] GABAPENTIN 100 MG CAPS (GABAPENTIN) 1 by mouth at bedtime  #90 x 3   Entered and Authorized by:   Jacques Navy MD   Signed by:   Jacques Navy MD on 02/20/2010   Method used:   Print then Give to Patient   RxID:   [id number] LEVITRA 10 MG  TABS (VARDENAFIL HCL) by mouth as needed  #18 x 3   Entered and Authorized by:   Jacques Navy MD   Signed by:   Jacques Navy MD on 02/20/2010   Method used:   Print then Give to Patient   RxID:   [id number] OMEPRAZOLE 20 MG CPDR (OMEPRAZOLE) Take 1 tablet by mouth two times  a day  #180 x 3   Entered and Authorized by:   Jacques Navy MD   Signed by:   Jacques Navy MD on 02/20/2010   Method used:   Print then Give to Patient   RxID:   [id number] ETODOLAC 500 MG  TABS (ETODOLAC) 1 two times a day  #180 x 3   Entered and Authorized by:   Jacques Navy MD   Signed by:   Jacques Navy MD on 02/20/2010   Method used:   Print then Give to Patient   RxID:   [id number] ACTOS 15 MG  TABS (PIOGLITAZONE HCL) by mouth once daily  #90 x 3   Entered and Authorized by:   Jacques Navy MD   Signed by:   Jacques Navy MD on 02/20/2010   Method used:   Print then Give to Patient   RxID:   [id number] AMARYL 2 MG  TABS (GLIMEPIRIDE) by mouth once daily  #90 x 3   Entered and Authorized by:   Jacques Navy MD   Signed by:   Jacques Navy MD on 02/20/2010   Method used:   Print then Give to Patient   RxID:   [id number] ALTACE 10 MG  TABS (RAMIPRIL) by mouth once daily  #90 x 3   Entered and Authorized by:   Jacques Navy MD   Signed by:   Jacques Navy MD on 02/20/2010   Method used:   Print then Give to Patient   RxID:   [id number]

## 2011-01-05 NOTE — Progress Notes (Signed)
Summary: NEEDS NEW RX'S FOR ALL MEDS  Phone Note Call from Patient Call back at Home Phone [phone]   Caller: Patient Summary of Call: Ian Johnson NEEDS NEW RX'S FOR ALL OF HIS MEDICATIONS WRITTEN FOR 90 DAY REFILLS.  HE IS OUT OF HIS BP MEDS AND ACID REFLUX MEDS.  HE HAS AN APPT ON MARCH 18 WITH DR Debby Bud FOR A FOLLOW UP.  CALL PT WHEN RX'S ARE READY TO PICK UP AT [phone].  THE REASON IS THAT HIS INSURANCE HAS CHANGED TO [facility].  HIS WIFE SENDS IT IN TO MAIL ORDER. Initial call taken by: Hilarie Fredrickson,  February 06, 2010 8:43 AM  Follow-up for Phone Call        Rx put in cabinet, pt informed via secure VM Follow-up by: Sherese Christopher  February 06, 2010 9:17 AM    Prescriptions: PRAVASTATIN SODIUM 40 MG TABS (PRAVASTATIN SODIUM) 1 q PM  #90 x 3   Entered by:   Margaret Pyle, CMA   Authorized by:   Etta Grandchild MD   Signed by:   Margaret Pyle, CMA on 02/06/2010   Method used:   Print then Give to Patient   RxID:   [id number] GABAPENTIN 100 MG CAPS (GABAPENTIN) 1 by mouth at bedtime  #90 x 3   Entered by:   Margaret Pyle, CMA   Authorized by:   Etta Grandchild MD   Signed by:   Margaret Pyle, CMA on 02/06/2010   Method used:   Print then Give to Patient   RxID:   [id number] LEVITRA 10 MG  TABS (VARDENAFIL HCL) by mouth as needed  #18 x 3   Entered by:   Margaret Pyle, CMA   Authorized by:   Etta Grandchild MD   Signed by:   Margaret Pyle, CMA on 02/06/2010   Method used:   Print then Give to Patient   RxID:   [id number] OMEPRAZOLE 20 MG CPDR (OMEPRAZOLE) Take 1 tablet by mouth two times a day  #180 x 3   Entered by:   Margaret Pyle, CMA   Authorized by:   Etta Grandchild MD   Signed by:   Margaret Pyle, CMA on 02/06/2010   Method used:   Print then Give to Patient   RxID:   [id number] ETODOLAC 500 MG  TABS (ETODOLAC) 1 two times a day  #180 x 3   Entered by:    Margaret Pyle, CMA   Authorized by:   Etta Grandchild MD   Signed by:   Margaret Pyle, CMA on 02/06/2010   Method used:   Print then Give to Patient   RxID:   [id number] ACTOS 15 MG  TABS (PIOGLITAZONE HCL) by mouth once daily  #90 x 3   Entered by:   Margaret Pyle, CMA   Authorized by:   Etta Grandchild MD   Signed by:   Margaret Pyle, CMA on 02/06/2010   Method used:   Print then Give to Patient   RxID:   [id number] AMARYL 2 MG  TABS (GLIMEPIRIDE) by mouth once daily  #90 x 3   Entered by:   Margaret Pyle, CMA   Authorized by:   Etta Grandchild MD   Signed by:   Margaret Pyle, CMA on 02/06/2010   Method used:   Print then Give to Patient   RxID:   [id number] ALTACE 10 MG  TABS (RAMIPRIL) by mouth once daily  #90 x 3  Entered by:   Margaret Pyle, CMA   Authorized by:   Etta Grandchild MD   Signed by:   Margaret Pyle, CMA on 02/06/2010   Method used:   Print then Give to Patient   RxID:   [id number]

## 2011-01-05 NOTE — Miscellaneous (Signed)
Summary: [facility]   Imported By: Lester [place] 02/27/2010 09:08:11  _____________________________________________________________________  External Attachment:    Type:   Image     Comment:   External Document

## 2011-01-05 NOTE — Assessment & Plan Note (Signed)
Summary: PER PT FU  STC   Vital Signs:  Patient profile:   55 year old male Height:      72 inches Weight:      195 pounds BMI:     26.54 O2 Sat:      97 % on Room air Temp:     98.5 degrees F oral Pulse rate:   63 / minute BP sitting:   122 / 72  (left arm) Cuff size:   regular  Vitals Entered By: Bill Salinas CMA (October 15, 2010 3:23 PM)  O2 Flow:  Room air CC: followup on diabetes/ ab   Primary Care Provider:  [facility]  CC:  followup on diabetes/ ab.  History of Present Illness: Patient presents for follow-up of diabetes and lipids. He reports that he is having continued and painful leg cramps. He has seen some improvement with gabapentin 100mg  at bedtime. When he starts having cramps he will hold his pravastatin for several days, which he sees as a cause of the cramps.  In the interval since his lasst vist the has had three operations to reconstruct the left eye socket and correct the inversion of the lower eyelid.   Current Medications (verified): 1)  Adult Aspirin Low Strength 81 Mg  Tbdp (Aspirin) .... By Mouth Once Daily 2)  Altace 10 Mg  Tabs (Ramipril) .... By Mouth Once Daily 3)  Amaryl 2 Mg  Tabs (Glimepiride) .... By Mouth Once Daily 4)  Actos 15 Mg  Tabs (Pioglitazone Hcl) .... By Mouth Once Daily 5)  Etodolac 500 Mg  Tabs (Etodolac) .Marland Kitchen.. 1 Two Times A Day 6)  Omeprazole 20 Mg Cpdr (Omeprazole) .... Take 1 Tablet By Mouth Two Times A Day 7)  Levitra 10 Mg  Tabs (Vardenafil Hcl) .... By Mouth As Needed 8)  Multivitamins   Tabs (Multiple Vitamin) .... Take 1 Tablet By Mouth Once A Day 9)  Gabapentin 100 Mg Caps (Gabapentin) .Marland Kitchen.. 1 By Mouth At Bedtime 10)  Co Q-10 200 Mg Caps (Coenzyme Q10) .... Take 1 Tablet By Mouth Once A Day 11)  Pravastatin Sodium 40 Mg Tabs (Pravastatin Sodium) .Marland Kitchen.. 1 Q Pm 12)  Metanx 13)  Cyclobenzaprine Hcl 5 Mg Tabs (Cyclobenzaprine Hcl) .Marland Kitchen.. 1 By Mouth Q 6 Hours As Needed For "musle Twitching" 14)  Benzonatate 100 Mg Caps (Benzonatate)  .Marland Kitchen.. 1 By Mouth Three Times A Day For Cough  Allergies (verified): 1)  Codeine  Past History:  Past Medical History: Last updated: 11/18/2008 NEPHROLITHIASIS, HX OF (ICD-V13.01) HYPERTENSION, UNSPECIFIED (ICD-401.9) AODM (ICD-250.00) * L-4-5 LUMBAR DISKECTOMY * RECONSTRUCTIVE FACIAL SURGERY OF THE LEFT ORBIT ELEVATION, TRANSAMINASE/LDH LEVELS (ICD-790.4) Hx of BACK PAIN (ICD-724.5) HYPERLIPIDEMIA (ICD-272.4) GERD (ICD-530.81) NEPHROLITHIASIS, HX OF (ICD-V13.01)   Physician Roster:        GI - Edwards        NS - Roy       Opthal - Sydnor ([place])       Lipid Management:  Jimmey Ralph    Past Surgical History: Last updated: 09/22/2007 * RECONSTRUCTIVE FACIAL SURGERY OF THE LEFT ORBIT * L-4-5 LUMBAR DISKECTOMY FH reviewed for relevance, SH/Risk Factors reviewed for relevance  Review of Systems  The patient denies anorexia, weight loss, weight gain, decreased hearing, chest pain, dyspnea on exertion, prolonged cough, headaches, abdominal pain, severe indigestion/heartburn, muscle weakness, depression, and enlarged lymph nodes.    Physical Exam  General:  alert, well-developed, and well-nourished.   Head:  normocephalic and no abnormalities observed.   Eyes:  soft-tissue swelling around the left socket with stitches visible at the lateral aspect Neck:  full ROM.   Lungs:  normal respiratory effort, normal breath sounds, and no wheezes.   Heart:  normal rate, regular rhythm, no murmur, and no JVD.   Abdomen:  soft, normal bowel sounds, no guarding, and no rigidity.   Msk:  normal ROM, no joint tenderness, no joint swelling, and no joint warmth.   Pulses:  2+ radial Neurologic:  alert & oriented X3, cranial nerves II-XII intact, gait normal, and DTRs symmetrical and normal.   Skin:  turgor normal, color normal, no purpura, and no ulcerations.   Cervical Nodes:  no anterior cervical adenopathy and no posterior cervical adenopathy.   Psych:  Oriented X3, memory intact for  recent and remote, normally interactive, and good eye contact.     Impression & Recommendations:  Problem # 1:  DIABETES MELLITUS, TYPE II, WITH NEUROLOGICAL COMPLICATIONS (ICD-250.60) Continued leg pain/cramps  His updated medication list for this problem includes:    Adult Aspirin Low Strength 81 Mg Tbdp (Aspirin) ..... By mouth once daily    Altace 10 Mg Tabs (Ramipril) ..... By mouth once daily    Amaryl 2 Mg Tabs (Glimepiride) ..... By mouth once daily    Actos 15 Mg Tabs (Pioglitazone hcl) ..... By mouth once daily  Labs Reviewed: Creat: 0.9 (10/09/2010)    Reviewed HgBA1c results: 7.2 (10/09/2010)  6.8 (02/06/2010)  Plan - increase step-wise the gabapentin to at least 100mg  three times a day. May go higher if necessary.  Problem # 2:  HYPERTENSION, UNSPECIFIED (ICD-401.9)  His updated medication list for this problem includes:    Altace 10 Mg Tabs (Ramipril) ..... By mouth once daily  BP today: 122/72 Prior BP: 110/60 (02/20/2010)  Labs Reviewed: K+: 5.0 (10/09/2010) Creat: : 0.9 (10/09/2010)   Good control  Problem # 3:  * RECONSTRUCTIVE FACIAL SURGERY OF THE LEFT ORBIT Still recovering from surgery. Hopefully is done with need for any additional reconstruction.  Problem # 4:  AODM (ICD-250.00) A1C 7.2%- no indication for change in med dose. Patient usually is well below 7%.  Plan - closer attn to diet and exercise.  His updated medication list for this problem includes:    Adult Aspirin Low Strength 81 Mg Tbdp (Aspirin) ..... By mouth once daily    Altace 10 Mg Tabs (Ramipril) ..... By mouth once daily    Amaryl 2 Mg Tabs (Glimepiride) ..... By mouth once daily    Actos 15 Mg Tabs (Pioglitazone hcl) ..... By mouth once daily  Complete Medication List: 1)  Adult Aspirin Low Strength 81 Mg Tbdp (Aspirin) .... By mouth once daily 2)  Altace 10 Mg Tabs (Ramipril) .... By mouth once daily 3)  Amaryl 2 Mg Tabs (Glimepiride) .... By mouth once daily 4)  Actos 15  Mg Tabs (Pioglitazone hcl) .... By mouth once daily 5)  Etodolac 500 Mg Tabs (Etodolac) .Marland Kitchen.. 1 two times a day 6)  Omeprazole 20 Mg Cpdr (Omeprazole) .... Take 1 tablet by mouth two times a day 7)  Levitra 10 Mg Tabs (Vardenafil hcl) .... By mouth as needed 8)  Multivitamins Tabs (Multiple vitamin) .... Take 1 tablet by mouth once a day 9)  Gabapentin 100 Mg Caps (Gabapentin) .Marland Kitchen.. 1 by mouth three times a day for leg cramps 10)  Co Q-10 200 Mg Caps (Coenzyme q10) .... Take 1 tablet by mouth once a day 11)  Pravastatin Sodium 40 Mg Tabs (Pravastatin sodium) .Marland KitchenMarland KitchenMarland Kitchen  1 q pm 12)  Metanx  13)  Cyclobenzaprine Hcl 5 Mg Tabs (Cyclobenzaprine hcl) .Marland Kitchen.. 1 by mouth q 6 hours as needed for "musle twitching" 14)  Benzonatate 100 Mg Caps (Benzonatate) .Marland Kitchen.. 1 by mouth three times a day for cough  Other Orders: Admin 1st Vaccine (16109) Flu Vaccine 31yrs + (60454) Prescriptions: GABAPENTIN 100 MG CAPS (GABAPENTIN) 1 by mouth three times a day for leg cramps  #270 x 3   Entered and Authorized by:   Jacques Navy MD   Signed by:   Jacques Navy MD on 10/15/2010   Method used:   Print then Give to Patient   RxID:   [id number]    Orders Added: 1)  Admin 1st Vaccine [90471] 2)  Flu Vaccine 57yrs + [30865] 3)  Est. Patient Level III [78469]   Flu Vaccine Consent Questions     Do you have a history of severe allergic reactions to this vaccine? no    Any prior history of allergic reactions to egg and/or gelatin? no    Do you have a sensitivity to the preservative Thimersol? no    Do you have a past history of Guillan-Barre Syndrome? no    Do you currently have an acute febrile illness? no    Have you ever had a severe reaction to latex? no    Vaccine information given and explained to patient? yes    Are you currently pregnant? no    Lot Number:[id number]   Exp Date:06/05/2011   Site Given  Left Deltoid [id number]

## 2011-02-04 ENCOUNTER — Other Ambulatory Visit: Payer: Self-pay

## 2011-02-09 ENCOUNTER — Other Ambulatory Visit: Payer: Self-pay | Admitting: Internal Medicine

## 2011-02-09 ENCOUNTER — Encounter (INDEPENDENT_AMBULATORY_CARE_PROVIDER_SITE_OTHER): Payer: Self-pay | Admitting: *Deleted

## 2011-02-09 ENCOUNTER — Other Ambulatory Visit: Payer: Self-pay

## 2011-02-09 DIAGNOSIS — E785 Hyperlipidemia, unspecified: Secondary | ICD-10-CM

## 2011-02-09 DIAGNOSIS — E1149 Type 2 diabetes mellitus with other diabetic neurological complication: Secondary | ICD-10-CM

## 2011-02-09 DIAGNOSIS — I10 Essential (primary) hypertension: Secondary | ICD-10-CM

## 2011-02-09 LAB — BASIC METABOLIC PANEL
BUN: 19 mg/dL (ref 6–23)
Creatinine, Ser: 1 mg/dL (ref 0.4–1.5)
Potassium: 5.1 mEq/L (ref 3.5–5.1)
Sodium: 139 mEq/L (ref 135–145)

## 2011-02-09 LAB — LIPID PANEL
Total CHOL/HDL Ratio: 4
Triglycerides: 97 mg/dL (ref 0.0–149.0)
VLDL: 19.4 mg/dL (ref 0.0–40.0)

## 2011-02-11 ENCOUNTER — Encounter: Payer: Self-pay | Admitting: Internal Medicine

## 2011-02-12 ENCOUNTER — Encounter: Payer: Self-pay | Admitting: Internal Medicine

## 2011-02-12 ENCOUNTER — Ambulatory Visit (INDEPENDENT_AMBULATORY_CARE_PROVIDER_SITE_OTHER): Payer: Commercial Managed Care - PPO | Admitting: Internal Medicine

## 2011-02-12 DIAGNOSIS — M79609 Pain in unspecified limb: Secondary | ICD-10-CM | POA: Insufficient documentation

## 2011-02-12 DIAGNOSIS — E785 Hyperlipidemia, unspecified: Secondary | ICD-10-CM

## 2011-02-12 DIAGNOSIS — E119 Type 2 diabetes mellitus without complications: Secondary | ICD-10-CM

## 2011-02-12 DIAGNOSIS — E1149 Type 2 diabetes mellitus with other diabetic neurological complication: Secondary | ICD-10-CM

## 2011-02-16 NOTE — Assessment & Plan Note (Signed)
Summary: PER PT FU  STC   Primary Care Provider:  Khloe Hunkele   History of Present Illness: Ian Johnson presents for follow up of DM and lipids. His last lab revealed A1C 7.6% - an increase over previous; his LDL  was 119 above goal and higher than previous labs.  He continues to have a problem with foot pain, which is limiting his exercise and contributing to loss of control of DM and lipids. He has a peripheral neuropathy but also suffers cramps in his legs and feet. He has tried inserts and well as taking gabapentin - now up to 100mg  three times a day.  He is otherwise doing well.  Allergies: 1)  Codeine  Past History:  Past Medical History: Last updated: 11/18/2008 NEPHROLITHIASIS, HX OF (ICD-V13.01) HYPERTENSION, UNSPECIFIED (ICD-401.9) AODM (ICD-250.00) * L-4-5 LUMBAR DISKECTOMY * RECONSTRUCTIVE FACIAL SURGERY OF THE LEFT ORBIT ELEVATION, TRANSAMINASE/LDH LEVELS (ICD-790.4) Hx of BACK PAIN (ICD-724.5) HYPERLIPIDEMIA (ICD-272.4) GERD (ICD-530.81) NEPHROLITHIASIS, HX OF (ICD-V13.01)   Physician Roster:        GI - Edwards        NS - Roy       Opthal - Sydnor ([place])       Lipid Management:  Jimmey Ralph    Past Surgical History: Last updated: 09/22/2007 * RECONSTRUCTIVE FACIAL SURGERY OF THE LEFT ORBIT * L-4-5 LUMBAR DISKECTOMY FH reviewed for relevance, SH/Risk Factors reviewed for relevance  Review of Systems       The patient complains of difficulty walking.  The patient denies anorexia, fever, weight loss, weight gain, chest pain, syncope, dyspnea on exertion, prolonged cough, abdominal pain, muscle weakness, abnormal bleeding, and enlarged lymph nodes.    Physical Exam  General:  alert, well-developed, well-nourished, well-hydrated, and normal appearance.   Head:  normocephalic and atraumatic.   Eyes:  right eye with C&S clear and pupil round Neck:  supple.   Lungs:  normal respiratory effort and normal breath sounds.   Heart:  normal rate and regular rhythm.    Abdomen:  normal bowel sounds.   Msk:  no joint tenderness, no joint swelling, and no joint warmth.   Pulses:  2+ pulses Neurologic:  alert & oriented X3, cranial nerves II-XII intact, and gait normal.   Skin:  turgor normal and color normal.   Cervical Nodes:  no anterior cervical adenopathy and no posterior cervical adenopathy.   Psych:  Oriented X3, memory intact for recent and remote, normally interactive, and good eye contact.     Impression & Recommendations:  Problem # 1:  DIABETES MELLITUS, TYPE II, WITH NEUROLOGICAL COMPLICATIONS (ICD-250.60) Suboptimal control.  Plan - continue current meds.           increase exercise           f/u lab in  4 months   His updated medication list for this problem includes:    Adult Aspirin Low Strength 81 Mg Tbdp (Aspirin) ..... By mouth once daily    Altace 10 Mg Tabs (Ramipril) ..... By mouth once daily    Amaryl 2 Mg Tabs (Glimepiride) ..... By mouth once daily    Actos 15 Mg Tabs (Pioglitazone hcl) ..... By mouth once daily  Problem # 2:  FOOT PAIN, BILATERAL (ICD-729.5)  Continued problem: DJD MTPs vs neuropathy.  Plan - consultation with Dr. Dallas Schimke           change sox twice a day.   Orders: Sports Medicine (Sports Med)  Problem # 3:  HYPERTENSION, UNSPECIFIED (ICD-401.9)  His updated medication list for this problem includes:    Altace 10 Mg Tabs (Ramipril) ..... By mouth once daily  Prior BP: 122/72 (10/15/2010)  Labs Reviewed: K+: 5.1 (02/09/2011) Creat: : 1.0 (02/09/2011)   Chol: 190 (02/09/2011)   HDL: 51.20 (02/09/2011)   LDL: 119 (02/09/2011)   TG: 97.0 (02/09/2011)  Good control with normal labs  Problem # 4:  HYPERLIPIDEMIA (ICD-272.4)  His updated medication list for this problem includes:    Pravastatin Sodium 40 Mg Tabs (Pravastatin sodium) .Marland Kitchen... 1 q pm  Labs Reviewed: SGOT: 40 (02/06/2010)   SGPT: 75 (02/06/2010)   HDL:51.20 (02/09/2011), 48.50 (10/09/2010)  LDL:119 (02/09/2011), 81 (16/09/9603)   Chol:190 (02/09/2011), 214 (10/09/2010)  Trig:97.0 (02/09/2011), 157.0 (10/09/2010)  Not at goal of 100 or less.  Plan - continue present meds           increased exercise.   Complete Medication List: 1)  Adult Aspirin Low Strength 81 Mg Tbdp (Aspirin) .... By mouth once daily 2)  Altace 10 Mg Tabs (Ramipril) .... By mouth once daily 3)  Amaryl 2 Mg Tabs (Glimepiride) .... By mouth once daily 4)  Actos 15 Mg Tabs (Pioglitazone hcl) .... By mouth once daily 5)  Etodolac 500 Mg Tabs (Etodolac) .Marland Kitchen.. 1 two times a day 6)  Omeprazole 20 Mg Cpdr (Omeprazole) .... Take 1 tablet by mouth two times a day 7)  Levitra 10 Mg Tabs (Vardenafil hcl) .... By mouth as needed 8)  Multivitamins Tabs (Multiple vitamin) .... Take 1 tablet by mouth once a day 9)  Gabapentin 100 Mg Caps (Gabapentin) .Marland Kitchen.. 1 by mouth three times a day for leg cramps 10)  Co Q-10 200 Mg Caps (Coenzyme q10) .... Take 1 tablet by mouth once a day 11)  Pravastatin Sodium 40 Mg Tabs (Pravastatin sodium) .Marland Kitchen.. 1 q pm 12)  Metanx  13)  Cyclobenzaprine Hcl 5 Mg Tabs (Cyclobenzaprine hcl) .Marland Kitchen.. 1 by mouth q 6 hours as needed for "musle twitching" 14)  Benzonatate 100 Mg Caps (Benzonatate) .Marland Kitchen.. 1 by mouth three times a day for cough Prescriptions: PRAVASTATIN SODIUM 40 MG TABS (PRAVASTATIN SODIUM) 1 q PM  #90 x 3   Entered and Authorized by:   Ian Navy MD   Signed by:   Ian Navy MD on 02/12/2011   Method used:   Print then Give to Patient   RxID:   [id number] GABAPENTIN 100 MG CAPS (GABAPENTIN) 1 by mouth three times a day for leg cramps  #270 x 3   Entered and Authorized by:   Ian Navy MD   Signed by:   Ian Navy MD on 02/12/2011   Method used:   Print then Give to Patient   RxID:   [id number] LEVITRA 10 MG  TABS (VARDENAFIL HCL) by mouth as needed  #18 x 3   Entered and Authorized by:   Ian Navy MD   Signed by:   Ian Navy MD on 02/12/2011   Method used:   Print then Give to  Patient   RxID:   [id number] OMEPRAZOLE 20 MG CPDR (OMEPRAZOLE) Take 1 tablet by mouth two times a day  #180 x 3   Entered and Authorized by:   Ian Navy MD   Signed by:   Ian Navy MD on 02/12/2011   Method used:   Print then Give to Patient   RxID:   [id number] ETODOLAC 500 MG  TABS (ETODOLAC) 1 two times a day  #  180 x 3   Entered and Authorized by:   Ian Navy MD   Signed by:   Ian Navy MD on 02/12/2011   Method used:   Print then Give to Patient   RxID:   [id number] ACTOS 15 MG  TABS (PIOGLITAZONE HCL) by mouth once daily  #90 x 3   Entered and Authorized by:   Ian Navy MD   Signed by:   Ian Navy MD on 02/12/2011   Method used:   Print then Give to Patient   RxID:   [id number] AMARYL 2 MG  TABS (GLIMEPIRIDE) by mouth once daily  #90 x 3   Entered and Authorized by:   Ian Navy MD   Signed by:   Ian Navy MD on 02/12/2011   Method used:   Print then Give to Patient   RxID:   [id number] ALTACE 10 MG  TABS (RAMIPRIL) by mouth once daily  #90 x 3   Entered and Authorized by:   Ian Navy MD   Signed by:   Ian Navy MD on 02/12/2011   Method used:   Print then Give to Patient   RxID:   [id number]    Orders Added: 1)  Sports Medicine [Sports Med] 2)  Est. Patient Level III [63875]

## 2011-02-16 NOTE — Letter (Signed)
[facility] [address] [place], [place]  [address] Phone: [phone]      February 11, 2011   [facility] [address] [place], [place] [address]  RE:  LAB RESULTS  Dear  Mr. Buckbee,  The following is an interpretation of your most recent lab tests.  Please take note of any instructions provided or changes to medications that have resulted from your lab work.  ELECTROLYTES:  Good - no changes needed  KIDNEY FUNCTION TESTS:  Good - no changes needed  LIVER FUNCTION TESTS:  Good - no changes needed  LIPID PANEL:  Stable - no changes needed Triglyceride: 97.0   Cholesterol: 190   LDL: 119   HDL: 51.20   Chol/HDL%:  4  DIABETIC STUDIES:  Improved - continue management Blood Glucose: 148   HgbA1C: 7.6     A1C still not at goal but not high enough to change medications. Lipid are also not quite at goal which is an LDL of 100 or less. May want to change medications from pravastatin to crestor 10.Let me know if you want to make the change   Sincerely Yours,    Jacques Navy MD  Patient: Ian Johnson Note: All result statuses are Final unless otherwise noted.  Tests: (1) Hemoglobin A1C (A1C)   Hemoglobin A1C       [H]  7.6 %                       4.6-6.5     Glycemic Control Guidelines for People with Diabetes:     Non Diabetic:  <6%     Goal of Therapy: <7%     Additional Action Suggested:  >8%   Tests: (2) BMP (METABOL)   Sodium                    139 mEq/L                   135-145   Potassium                 5.1 mEq/L                   3.5-5.1   Chloride                  103 mEq/L                   96-112   Carbon Dioxide            29 mEq/L                    19-32   Glucose              [H]  148 mg/dL                   44-01   BUN                       19 mg/dL                    0-27   Creatinine                1.0 mg/dL                   2.5-3.6   Calcium  9.2 mg/dL                   0.9-81.1   GFR                        85.96 mL/min                >60.00  Tests: (3) Lipid Panel (LIPID)   Cholesterol               190 mg/dL                   9-147     ATP III Classification            Desirable:  < 200 mg/dL                    Borderline High:  200 - 239 mg/dL               High:  > = 240 mg/dL   Triglycerides             97.0 mg/dL                  8.2-956.2     Normal:  <150 mg/dL     Borderline High:  130 - 199 mg/dL   HDL                       86.57 mg/dL                 >84.69   VLDL Cholesterol          19.4 mg/dL                  6.2-95.2   LDL Cholesterol      [H]  841 mg/dL                   3-24  CHO/HDL Ratio:  CHD Risk                             4                    Men          Women     1/2 Average Risk     3.4          3.3     Average Risk          5.0          4.4     2X Average Risk          9.6          7.1     3X Average Risk          15.0          11.0

## 2011-02-24 ENCOUNTER — Encounter: Payer: Self-pay | Admitting: Family Medicine

## 2011-02-24 ENCOUNTER — Ambulatory Visit (INDEPENDENT_AMBULATORY_CARE_PROVIDER_SITE_OTHER): Payer: Commercial Managed Care - PPO | Admitting: Family Medicine

## 2011-02-24 DIAGNOSIS — M7740 Metatarsalgia, unspecified foot: Secondary | ICD-10-CM

## 2011-02-24 DIAGNOSIS — E1142 Type 2 diabetes mellitus with diabetic polyneuropathy: Secondary | ICD-10-CM

## 2011-02-24 DIAGNOSIS — E1149 Type 2 diabetes mellitus with other diabetic neurological complication: Secondary | ICD-10-CM

## 2011-02-24 DIAGNOSIS — M7750 Other enthesopathy of unspecified foot: Secondary | ICD-10-CM

## 2011-02-24 DIAGNOSIS — M79609 Pain in unspecified limb: Secondary | ICD-10-CM

## 2011-02-24 DIAGNOSIS — E1140 Type 2 diabetes mellitus with diabetic neuropathy, unspecified: Secondary | ICD-10-CM

## 2011-02-24 NOTE — Patient Instructions (Signed)
Follow-up in a few weeks  BRING ALL YOUR SHOES, WORK BOOTS, AND DIFFERENT INSOLES.

## 2011-02-24 NOTE — Assessment & Plan Note (Signed)
>  30 minutes spent in face to face time with patient, >50% spent in counselling or coordination of care: Significant forefoot breakdown, and anatomy was reviewed with him. Attempted to explain foot mechanics and breakdown.  Recommendations: Placed MT pads in his forefoot area. If this does not work, then a MT bar would be reasonable We discussed plastazote diabetic orthotics, which I think would be helpful. He had a number of OTC products, and is going to bring them back at his f/u and all of his shoes to review wear patterns. Agree with titration up of Neurontin in DM neuropathy  Cc: Dr. Debby Bud

## 2011-02-24 NOTE — Progress Notes (Signed)
55 year old male:  Consult from Dr. Arthur Holms for evaluation of foot pain:   Has had DM since the age of 42. Does construction work, walks on rough work. Walks on all types of elevation. Has neuropathy on both of his feet. Feels like something is in his shoes when he puts them on. To control is DM, has been trying to work on his diet and exercise a lot. Currently taking Neurontin - but making somewhat drowsy on titration up. Takes 100 mg, now total dose 400 mg a day.  Has a lot of cramps in his legs, will be in his calves and up in his upper legs. Will also be in his upper toes. Cramps all throuh out the lowe legs and toes.  Used to take some quinine for leg cramps.   Metatarsalgia: Has been to good feet, shoe market, and has spent a lot on different shoes and insoles. Wears a work IT consultant.  Significant longstanding hammertoes, now with a great deal of B forefoot pain. No specific trauma.  The PMH, PSH, Social History, Family History, Medications, and allergies have been reviewed in [facility], and have been updated if relevant.   REVIEW OF SYSTEMS  GEN: No fevers, chills. Nontoxic. Primarily MSK c/o today. MSK: Detailed in the HPI GI: tolerating PO intake without difficulty Neuro: neuropathic sensations as described. Otherwise the pertinent positives of the ROS are noted above.    GEN: Well-developed,well-nourished,in no acute distress; alert,appropriate and cooperative throughout examination HEENT: Normocephalic and atraumatic without obvious abnormalities. Ears, externally no deformities PULM: Breathing comfortably in no respiratory distress EXT: No clubbing, cyanosis, or edema PSYCH: Normally interactive. Cooperative during the interview. Pleasant. Friendly and conversant. Not anxious or depressed appearing. Normal, full affect.  FEET: B Echymosis: no Edema: no ROM: full LE B Gait: heel toe, non-antalgic, slight outward turn on R MT pain: YES, PAIN WITH SQUEEZE AND MT HEADS 2-4 ALL  DROPPED Callus pattern: 2-4 MILD Lateral Mall: NT Medial Mall: NT Talus: NT Navicular: NT Cuboid: NT Calcaneous: NT Metatarsals: NT 5th MT: NT Phalanges: NT Achilles: NT Plantar Fascia: NT Fat Pad: NT Peroneals: NT Post Tib: NT Great Toe: Nml motion Ant Drawer: neg ATFL: NT CFL: NT Deltoid: NT Other foot breakdown: none Long arch: MODERATE BREAKDOWN, NATURAL CAVUS FOOT Transverse arch: SEVERE BREAKDOWN, SIGNIFICANT HAMMERTOE FORMATION, DROPPED MT HEADS Hindfoot breakdown: none Sensation: intact

## 2011-03-17 ENCOUNTER — Ambulatory Visit: Payer: Commercial Managed Care - PPO | Admitting: Family Medicine

## 2011-03-24 ENCOUNTER — Encounter: Payer: Self-pay | Admitting: Family Medicine

## 2011-03-24 ENCOUNTER — Ambulatory Visit (INDEPENDENT_AMBULATORY_CARE_PROVIDER_SITE_OTHER): Payer: Commercial Managed Care - PPO | Admitting: Family Medicine

## 2011-03-24 DIAGNOSIS — E1140 Type 2 diabetes mellitus with diabetic neuropathy, unspecified: Secondary | ICD-10-CM

## 2011-03-24 DIAGNOSIS — M7750 Other enthesopathy of unspecified foot: Secondary | ICD-10-CM

## 2011-03-24 DIAGNOSIS — E1149 Type 2 diabetes mellitus with other diabetic neurological complication: Secondary | ICD-10-CM

## 2011-03-24 DIAGNOSIS — E1142 Type 2 diabetes mellitus with diabetic polyneuropathy: Secondary | ICD-10-CM

## 2011-03-24 DIAGNOSIS — M7740 Metatarsalgia, unspecified foot: Secondary | ICD-10-CM

## 2011-03-24 NOTE — Progress Notes (Signed)
56 year old, f/u foot pain:  Continues with significant neuropathy of feet, titrating up neurontin. Metatarsalgia, with dramatic forefoot breakdown. Patient did not like MT pads that were placed last OV.  Obtained plastazote OTC diabetic orthotic, which is seemingly comfortable. Continues with forefoot pain, however.  Is open to any suggestions.  02/24/2011 OV Has had DM since the age of 2. Does construction work, walks on rough work. Walks on all types of elevation. Has neuropathy on both of his feet. Feels like something is in his shoes when he puts them on. To control is DM, has been trying to work on his diet and exercise a lot. Currently taking Neurontin - but making somewhat drowsy on titration up. Takes 100 mg, now total dose 400 mg a day.  Has a lot of cramps in his legs, will be in his calves and up in his upper legs. Will also be in his upper toes. Cramps all throuh out the lowe legs and toes.   Used to take some quinine for leg cramps.   Metatarsalgia:  Has been to good feet, shoe market, and has spent a lot on different shoes and insoles.  Wears a work IT consultant.  Significant longstanding hammertoes, now with a great deal of B forefoot pain. No specific trauma.   The PMH, PSH, Social History, Family History, Medications, and allergies have been reviewed in [facility], and have been updated if relevant.   REVIEW OF SYSTEMS  GEN: No fevers, chills. Nontoxic. Primarily MSK c/o today.  MSK: Detailed in the HPI  GI: tolerating PO intake without difficulty  Neuro: neuropathic sensations as described.  Otherwise the pertinent positives of the ROS are noted above.   Physical:  GEN: Well-developed,well-nourished,in no acute distress; alert,appropriate and cooperative throughout examination  HEENT: Normocephalic and atraumatic without obvious abnormalities. Ears, externally no deformities  PULM: Breathing comfortably in no respiratory distress  EXT: No clubbing, cyanosis, or edema  PSYCH:  Normally interactive. Cooperative during the interview. Pleasant. Friendly and conversant. Not anxious or depressed appearing. Normal, full affect.  FEET: B  Echymosis: no  Edema: no  ROM: full LE B  Gait: heel toe, non-antalgic, slight outward turn on R  MT pain: YES, PAIN WITH SQUEEZE AND MT HEADS 2-4 ALL DROPPED  Callus pattern: 2-4 MILD  Lateral Mall: NT  Medial Mall: NT  Talus: NT  Navicular: NT  Cuboid: NT  Calcaneous: NT  Metatarsals: NT  5th MT: NT  Phalanges: NT  Achilles: NT  Plantar Fascia: NT  Fat Pad: NT  Peroneals: NT  Post Tib: NT  Great Toe: Nml motion  Ant Drawer: neg  ATFL: NT  CFL: NT  Deltoid: NT  Other foot breakdown: none  Long arch: MODERATE BREAKDOWN, NATURAL CAVUS FOOT  Transverse arch: SEVERE BREAKDOWN, SIGNIFICANT HAMMERTOE FORMATION, DROPPED MT HEADS  Hindfoot breakdown: none  Sensation: intact   A/P: 1. Metatarsalgia: unchanged. Severe forefoot breakdown. I like his plastazote orthotics - I added B metatarsal bars with poron for additional MT cushioning which hopefully will helped. When walking in the hallway, noted more comfort.  This is a challenging problem without an easy solution.  2. Neuropathy: I think much of his foot pain is from neuropathy - agree with continued titration of neurontin as much as he can tolerate.

## 2011-04-20 NOTE — Assessment & Plan Note (Signed)
[facility]                               LIPID CLINIC NOTE   NAME:Ian Johnson, Ian Johnson                      MRN:          [id number]  DATE:10/28/2008                            DOB:          [birth year]    The patient is seen in Lipid Clinic for further evaluation and  medication titration associated with his hyperlipidemia in the setting  of documented risk factor for diabetes.  Ian Johnson is a very pleasant  gentleman.  He is not currently taking any cholesterol therapy.  He has  previous intolerances to GEMFIBROZIL, RED YEAST RICE, multiple strings  of LIPITOR, multiple strings of VYTORIN, CRESTOR 10 mg daily.  He states  he has been taking nothing for the past 7-8 months.  He has reflux  associated with all of his meds.  He states that gabapentin is helping  his sleep, but is not helping his legs yet as he continues to have lower  extremity pains.  He was diagnosed of note with diabetes at the age of  32.  His diet includes fruits and vegetables, beans, greens, mainly  chicken and fish with occasional red meat.  Very little of his meals or  foods are fried.  He does select whole grain breads, peanut butter  crackers, sweet bar for snacks.  He does drink water and sometimes  includes Propel powder with the water and diet sodas.  He exercises  mainly through his work, though he does have a treadmill in his garage.  He does also lift weights at home periodically.  This has been decreased  overall in the past several months because of chronic injury to his  knee.  He is considering swimming, he started this before.  He states  that his limiting factor is leg cramps that are brought on by cold at  work.  He does not smoke tobacco and does not drink alcohol.   PAST MEDICAL HISTORY:  Pertinent for diabetes, hyperlipidemia.   CURRENT MEDICATIONS:  Glimepiride, ramipril, Actos, omeprazole, etodolac  for his ruptured disk, aspirin 81 mg daily, Levitra,  magnesium,  multivitamin for cramps, and gabapentin that he just started.   PHYSICAL EXAMINATION:  Weight is 200-3/4 pounds, blood pressure is  130/72, heart rate is 68.   LABORATORY DATA:  On November 9, revealed total cholesterol 218,  triglyceride 120, HDL 47, LDL directly measured 142.  LFTs, AST is 40,  ALT is 79. A1c is 7.1, glucose is 136.  Prior to starting lipid, his AST  was 56 and his ALT was 32.   ASSESSMENT:  The patient has an intolerance to some STATINS, but he has  not tried pravastatin with CoQ10 or Colestid.  Leg cramps, this appeared  to be muscular due to description, though they decrease in frequency and  intensity when not taking his statin.   PLAN:  The patient will begin pravastatin 40 mg daily and CoQ10 150-200  mg daily in the evening.  He will continue to follow with low-fat, low-  cholesterol diet.  He will consider swimming as an exercise therapy.  He  will follow up with [place] in 3 months.  [facility] in [place], [address], will receive a prescription today for pravastatin 40 with  three refills.  He will call  for questions or problems in the meantime.  I appreciate the opportunity  to see this pleasant patient.      Shelby Dubin, PharmD, BCPS, CPP  Electronically Signed      Madolyn Frieze. Jens Som, MD, [facility]  Electronically Signed   MP/MedQ  DD: 11/18/2008  DT: 11/19/2008  Job #: [id number]

## 2011-04-20 NOTE — Assessment & Plan Note (Signed)
[facility]                           PRIMARY CARE OFFICE NOTE   NAME:Ian Johnson, Ian Johnson                      MRN:          [id number]  DATE:08/23/2007                            DOB:          1956/07/10    Ian Johnson is a 55 year old gentleman followed for diabetes,  hyperlipidemia, who presents for followup evaluation and exam.   The patient reports that, starting last Saturday, he had the onset of  low back pain, which developed to include radiation to both groins and  some scrotal discomfort.  This is different than his usual back pain.  He has not had any rigors or fevers.  He has had no dysuria or change in  urinary frequency, force of stream, or other signs of BPH.   Lipids, the patient has been taking Vytorin 10/40.  He did develop  significant severe leg cramps.  Through his reading, he discovered red  yeast rice and approximately 2 months ago began tapering down his  Vytorin, first going to every other day and then switching to 1/2 tablet  daily, which is his current regimen, plus red yeast rice.  He reports  his leg discomfort is better, although he continued to have leg cramps  until Saturday.   The patient reports he has been adhering to his diabetic regimen and  feels his blood sugars have been doing reasonably well.   PAST MEDICAL HISTORY:  Well-documented in note of September 29, 2005  without significant change.   PHYSICIAN ROSTER:  Continues to include Dr. Randa Evens for GI, Dr. Channing Mutters for  neurosurgery, Dr. in [place] for ophthalmology.   FAMILY HISTORY/SOCIAL HISTORY:  Remain unchanged.   CURRENT MEDICATIONS:  1. Aspirin 81 mg daily.  2. Altace 10 mg daily.  3. Amaryl 2 mg daily.  4. Actos 15 mg daily.  5. Etodolac 500 mg b.i.d.  6. Vytorin 10/20 daily.  7. Omeprazole 20 mg 2 tablets nightly.  8. Magnesium oxide 400 mg b.i.d.  9. Levitra 10 mg p.r.n.   REVIEW OF SYSTEMS:  The patient has had no documented fevers or chills.  No ophthalm problems, although he has a prosthetic left eye.  No ENT,  cardiovascular, respiratory problems.  No GI complaints, GU,  musculoskeletal as above.  No dermatologic problems.  Neurologic, the  patient complains of decreased sensations in his right hand and right  foot.   EXAMINATION:  Temperature was 97.9, blood pressure 119/74, pulse 64,  weight 195.  GENERAL APPEARANCE:  A well-nourished, well-developed muscular gentleman  in no acute distress.  HEENT:  Normocephalic, atraumatic.  EACs and TMs  were unremarkable.  Oropharynx with native dentition in good repair.  No buccal or palatal  lesions were noted.  Posterior oropharynx was clear.  Conjunctivae of  the right eye was normal.  The patient has a prosthesis in the left eye  socket.  NECK:  Supple without thyromegaly.  NODES:  No adenopathy was noted in the cervical, supraclavicular, or  inguinal regions.  CHEST:  No CVA tenderness.  LUNGS:  Clear with no rales, wheezes, or rhonchi.  CARDIOVASCULAR:  2+  radial pulses.  No JVD.  No carotid bruits.  He had  a quiet precordium with regular rate and rhythm without murmurs, rubs,  or gallops.  ABDOMEN:  Soft.  No guarding.  No rebound.  There was no organo-  splenomegaly.  The patient had tenderness to deep palpation in the lower  quadrants bilaterally as well as in the suprapubic region.  The patient  had no tenderness to percussion over his flanks.  GENITALIA:  The patient has a slightly atrophic right testicle.  Left  testicle was normal.  The patient had mild tenderness to palpation.  RECTAL:  Normal sphincter tone was noted.  Prostate was smooth, normal  in size, very warm to the touch, very painful.  Tender on exam.  EXTREMITIES:  The patient could stand without assistance.  He could flex  at the waist to 160 degrees.  He had a normal toe walk and heel walk.  He was able to step up to the exam table with either leg.  He had a  normal straight leg maneuver in both the  sitting and supine positions.  DTRs were 2+ and symmetrical at the patellar tendon.  NEUROLOGIC:  The patient is awake, alert, and oriented to person, place,  time, and context.  Deep tendon reflexes were normal at the biceps,  radial, and patellar tendons.  The patient had slight decreased  sensation to light touch, pin prick, and deep vibratory sensation at the  right foot.  Normal on the left foot.   LABORATORY:  From August 11, 2007, liver functions were normal.  Cholesterol 138, triglycerides 53, HDL 48.8, LDL was 79, potassium 4.0,  creatinine 0.8.  A1c was 6.9%.   ASSESSMENT AND PLAN:  1. Prostatitis.  The patient with low back pain, low abdominal pain,      and tender prostate on examination, all consistent with      prostatitis.  Mechanism of disease is explained to the patient.      Plan:  The patient is started on ciprofloxacin 250 mg b.i.d. for 14      days.  2. Diabetes.  The patient is well controlled on his present medical      regimen.  Will continue the same.  3. Lipids.  Discussed lipid management at length, including the      probable side effects he was having with Vytorin 10/40 as well as      the use of red yeast rice with its un-quantified amount of      lovastatin.  Plan:  Will start the patient on Crestor 10 mg daily.      Samples provided using 5 mg tablets, which he will take 2 at a      time.  Prescription is provided.  The patient is to return for      followup lipid panel and liver function in 3 to 4 weeks.  Long-term      prescription was also provided.  4. Hypertension.  The patient's blood pressure is very well      controlled.  5. Chronic back pain.  The patient does have chronic low back pain,      which is intermittent and usually exacerbated by activity.  He will      continue with etodolac on a p.r.n. basis.  6. Gastrointestinal.  The patient with dyspepsia that is well      controlled with omeprazole.  7. Leg cramps.  The patient does take  magnesium oxide.  He  continued      to have leg cramps.  I doubt that this was related to the Statin      drugs, given that even after reducing his dose, he continued to      have leg cramps.  The patient's potassium was normal.  If the      patient continues to have ongoing leg cramps that are persistent, I      would consider the use of gabapentin.  8. Health maintenance.  The patient's last EGD was in 1999.  I do not      have copies of the patient's last colonoscopy, if in fact it was      done.  I would ask him to follow up with Dr. Randa Evens in this      regard.     Rosalyn Gess Norins, MD  Electronically Signed    MEN/MedQ  DD: 08/24/2007  DT: 08/24/2007  Job #: [id number]   cc:   Adriana Simas. Malon Kindle., M.D.

## 2011-04-20 NOTE — Assessment & Plan Note (Signed)
[facility]                               LIPID CLINIC NOTE   NAME:Ian Johnson, Ian Johnson                      MRN:          [id number]  DATE:01/27/2009                            DOB:          24-Aug-1956    The patient is seen back in the Lipid Clinic for further evaluation of  medication titration associated with this combination of current lipid-  lowering therapy.  He has past intolerances to GEMFIBROZIL, RED YEAST  RICE, LIPITOR, VYTORIN, CRESTOR 10.  He is currently maintained on  pravastatin 40 mg daily with Coenzyme Q10 a 150-200 mg each morning.  He  has a few cramps, but he describes them as very different from the ones  he had previously that were limiting of his dosing and they are quite  tolerable.  He has past medical history pertinent for diabetes diagnosed  at age 42.  Diet reviewed.  He continues to choose lean meats that are  grilled and follows a diabetic diet.  He exercises by using the  treadmill for 20 minutes 2-3 times a week for more than a mile in that  time frame.   He has physical exam today.  Weight 203 quarters pounds, now down to 194  in the office today, 130/72 his blood pressure, respiratory rate is 17.   Labs in February 2010 revealed total cholesterol 189, triglycerides 92,  HDL 51.5, LDL 119.  LFTs are within normal limits except an ALT of 61.  His glucoses are setting in the 130s.   Meds include:  1. Glimepiride.  2. Etodolac.  3. Ramipril.  4. Actos.  5. Omeprazole.  6. CoQ10.  7. Aspirin 81 mg daily.  8. Multivitamin.  9. Pravastatin 40 mg daily.  10.Gabapentin.  11.Metanx.  12.Neck Balm.  13.Folate.  14.Pyridoxine.   ASSESSMENT:  The patient is a 55 year old male with multiple statin  intolerances, now on pravastatin 40 mg a day and CoQ10.  He is having  tolerable cramps.  He is not 100% satisfied, but he has more significant  and intolerable cramps prior to that time.   PLAN:  The patient will continue  his pravastatin and CoQ10.  He will  work to increase his exercise 4-5 days a week.  He will follow up in 4  months and call with questions or problems in the meantime.  The patient  instantly has continued to have some neuropathy in his right foot.  Gabapentin helped it first but not as much.  He has not got a cortisone  shot and this lasted a  little bit.  He has not noted significant change since that time.  I  appreciate the opportunity to see this patient.  The patient was seen  with Mina Marble, PharmD.       Shelby Dubin, PharmD, BCPS, CPP  Electronically Signed      Rollene Rotunda, MD, [facility]  Electronically Signed   MP/MedQ  DD: 01/31/2009  DT: 02/01/2009  Job #: [id number]   cc:   Rosalyn Gess. Norins, MD

## 2011-04-23 NOTE — Letter (Signed)
December 08, 2006    Mark C. Ophelia Charter, M.D.  [address] [place], [place] [address]   RE:  AEON, KESSNER  MRN:  [id number]  /  DOB:  08/11/1956   Dear. Loraine Leriche,   Thank you very much for seeing [name], a very pleasant 55-  year-old Caucasian gentleman for recurrent left shoulder pain.   The patient has had a 4 to 66-month history of having significant pain  and discomfort in his left shoulder, usually brought on by working over  his head.  I have tried him on anti-inflammatory medications, including  etodolac and Aleve.  He will get improvement, but then anytime he works  over his head, he will once again get significant activity-limiting  discomfort.  The patient does not have any pop or dislocation, just  ongoing pain and discomfort.   PAST MEDICAL HISTORY:  1. Nephrolithiasis in the past.  2. Hyperlipidemia.  3. Non-insulin-dependent diabetes.  4. GERD.  5. He has had no previous orthopedic surgery.   CURRENT MEDICATIONS:  1. Aspirin 81 mg daily.  2. Altace 10 mg daily.  3. Amaryl 2 mg daily.  4. Actos 15 mg daily.  5. Etodolac 500 mg b.i.d.  6. Vytorin 10/40 once daily.  7. Omeprazole 40 mg q.h.s.   If I can provide any additional information in regards to this nice  patient, please do not hesitate to contact me.  As always, I appreciate  your assistance and care of these nice patients and look forward to  hearing from you.    Sincerely,      Rosalyn Gess. Norins, MD  Electronically Signed    MEN/MedQ  DD: 12/08/2006  DT: 12/08/2006  Job #: [id number]

## 2011-04-23 NOTE — Op Note (Signed)
[facility]  Patient:    Ian Johnson, Ian Johnson                      MRN: [id number] Proc. Date: 05/24/01 Adm. Date:  [id number] Attending:  Laqueta Jean                           Operative Report  PREOPERATIVE DIAGNOSIS:  Impacted left ureteral stone.  POSTOPERATIVE DIAGNOSIS:  Impacted left ureteral stone.  OPERATION PERFORMED:  Cystourethroscopy, balloon dilation left ureteral orifice, ureteroscopy, basket extracting multiple left ureteral stones, xylocaine placement in the ureter.  SURGEON:  Dr. Patsi Sears.  ANESTHESIA:  General.  PREPARATION:  After appropriate preanesthesia, the patient was brought to the operating room, placed on the operating table in the dorsal supine position where general anesthesia was introduced. He was then replaced in the dorsal lithotomy position where the pubis was prepped with Betadine solution and draped in the usual fashion.  DESCRIPTION OF PROCEDURE:  Cystoscopy was accomplished and ureteral stone was identified and balloon dilation was accomplished after a wire was passed around the stone which was located in the orifice. Four centimeter balloon dilation was accomplished for three minutes, and following this ureteroscopy was accomplished and basket extraction of the stone was accomplished. Following this, xylocaine was placed in the ureter and the patient was awakened and taken to the recovery room in good condition. DD:  05/24/01 TD:  05/25/01 Job: [id number]

## 2011-06-07 ENCOUNTER — Other Ambulatory Visit: Payer: Self-pay

## 2011-06-07 ENCOUNTER — Other Ambulatory Visit: Payer: Self-pay | Admitting: Internal Medicine

## 2011-06-07 DIAGNOSIS — E785 Hyperlipidemia, unspecified: Secondary | ICD-10-CM

## 2011-06-07 DIAGNOSIS — IMO0001 Reserved for inherently not codable concepts without codable children: Secondary | ICD-10-CM

## 2011-06-07 DIAGNOSIS — I10 Essential (primary) hypertension: Secondary | ICD-10-CM

## 2011-06-16 ENCOUNTER — Other Ambulatory Visit: Payer: Self-pay | Admitting: Internal Medicine

## 2011-06-16 ENCOUNTER — Other Ambulatory Visit: Payer: Commercial Managed Care - PPO

## 2011-06-16 DIAGNOSIS — E785 Hyperlipidemia, unspecified: Secondary | ICD-10-CM

## 2011-06-16 DIAGNOSIS — IMO0001 Reserved for inherently not codable concepts without codable children: Secondary | ICD-10-CM

## 2011-06-16 DIAGNOSIS — T887XXA Unspecified adverse effect of drug or medicament, initial encounter: Secondary | ICD-10-CM

## 2011-06-21 ENCOUNTER — Other Ambulatory Visit (INDEPENDENT_AMBULATORY_CARE_PROVIDER_SITE_OTHER): Payer: Commercial Managed Care - PPO

## 2011-06-21 ENCOUNTER — Other Ambulatory Visit: Payer: Self-pay | Admitting: Internal Medicine

## 2011-06-21 DIAGNOSIS — T887XXA Unspecified adverse effect of drug or medicament, initial encounter: Secondary | ICD-10-CM

## 2011-06-21 DIAGNOSIS — IMO0001 Reserved for inherently not codable concepts without codable children: Secondary | ICD-10-CM

## 2011-06-21 DIAGNOSIS — E785 Hyperlipidemia, unspecified: Secondary | ICD-10-CM

## 2011-06-21 LAB — HEMOGLOBIN A1C: Hgb A1c MFr Bld: 7.8 % — ABNORMAL HIGH (ref 4.6–6.5)

## 2011-06-21 LAB — BASIC METABOLIC PANEL
BUN: 23 mg/dL (ref 6–23)
Creatinine, Ser: 1 mg/dL (ref 0.4–1.5)
GFR: 83.83 mL/min (ref >60.00)
Potassium: 5 mEq/L (ref 3.5–5.1)

## 2011-06-21 LAB — HEPATIC FUNCTION PANEL
ALT: 41 U/L (ref 0–53)
Albumin: 4.4 g/dL (ref 3.5–5.2)
Alkaline Phosphatase: 47 U/L (ref 39–117)
Bilirubin, Direct: 0.2 mg/dL (ref 0.0–0.3)
Total Protein: 7.7 g/dL (ref 6.0–8.3)

## 2011-06-21 LAB — LDL CHOLESTEROL, DIRECT: Direct LDL: 142.9 mg/dL

## 2011-06-21 LAB — LIPID PANEL
Cholesterol: 202 mg/dL — ABNORMAL HIGH (ref 0–200)
HDL: 54.7 mg/dL (ref >39.00)

## 2011-06-25 ENCOUNTER — Telehealth: Payer: Self-pay | Admitting: *Deleted

## 2011-06-25 NOTE — Telephone Encounter (Signed)
Request for refill on Gabapentin 100 mg. Pt states this his dosage was increased and he does not have enough medication to last until his 07/01/11 OV. Centricity showing last refill #[id number] on 02/12/11 [verified w/patient] with SIG: take 1 capsule TID Please advise.

## 2011-06-26 MED ORDER — GABAPENTIN 100 MG PO TABS: 100.0000 mg | ORAL_TABLET | Freq: Three times a day (TID) | ORAL | Status: DC

## 2011-06-26 NOTE — Telephone Encounter (Signed)
Refill done.  

## 2011-06-28 NOTE — Telephone Encounter (Signed)
Pt. Informed.

## 2011-07-01 ENCOUNTER — Ambulatory Visit (INDEPENDENT_AMBULATORY_CARE_PROVIDER_SITE_OTHER): Payer: Commercial Managed Care - PPO | Admitting: Internal Medicine

## 2011-07-01 VITALS — BP 120/80 | HR 60 | Temp 97.7°F | Wt 192.0 lb

## 2011-07-01 DIAGNOSIS — E1140 Type 2 diabetes mellitus with diabetic neuropathy, unspecified: Secondary | ICD-10-CM

## 2011-07-01 DIAGNOSIS — E1149 Type 2 diabetes mellitus with other diabetic neurological complication: Secondary | ICD-10-CM

## 2011-07-01 DIAGNOSIS — I10 Essential (primary) hypertension: Secondary | ICD-10-CM

## 2011-07-01 DIAGNOSIS — E119 Type 2 diabetes mellitus without complications: Secondary | ICD-10-CM

## 2011-07-01 DIAGNOSIS — E1142 Type 2 diabetes mellitus with diabetic polyneuropathy: Secondary | ICD-10-CM

## 2011-07-01 DIAGNOSIS — E785 Hyperlipidemia, unspecified: Secondary | ICD-10-CM

## 2011-07-01 MED ORDER — PIOGLITAZONE HCL 30 MG PO TABS: 30.0000 mg | ORAL_TABLET | Freq: Every day | ORAL | Status: DC

## 2011-07-01 MED ORDER — ROSUVASTATIN CALCIUM 10 MG PO TABS: 10.0000 mg | ORAL_TABLET | Freq: Every day | ORAL | Status: DC

## 2011-07-01 MED ORDER — GABAPENTIN 300 MG PO CAPS: ORAL_CAPSULE | ORAL | Status: DC

## 2011-07-01 NOTE — Patient Instructions (Signed)
diabetes - A1C 7.8% climbing up. Plan - increase actos to 30mg  daily. New prescription to mail.Watch the diet as you have been and try to exercise.  Leg cramps - ok to increase gabapentin to 300mg  twice a day. New prescription to mail.  Cholesterol. - LDL 142.9 with a goal of 100 or less. Plan - change to crestor 10mg  daily. Lab follow-up August 27th.  Rx for local fill.

## 2011-07-03 NOTE — Progress Notes (Signed)
  Subjective:    Patient ID: Ian Johnson, male    DOB: 1956-01-11, 55 y.o.   MRN: [id number]  HPI [name] presents for follow-up of diabetes and hyperlipidemia. He has been doing pretty well but does have some arthritic pain in the legs that can somewhat limit his activity. 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 He has had no medical illness and no injury in the interval since his last visit.   PMH, FamHx and SocHx reviewed for any changes and relevance.    Review of Systems Review of Systems  Constitutional:  Negative for fever, chills, activity change and unexpected weight change.  HEENT:  Negative for hearing loss, ear pain, congestion, neck stiffness and postnasal drip. Negative for sore throat or swallowing problems. Negative for dental complaints.   Eyes: Negative for vision loss or change in visual acuity  In his eye.  Respiratory: Negative for chest tightness and wheezing.   Cardiovascular: Negative for chest pain and palpitation. No decreased exercise tolerance Gastrointestinal: No change in bowel habit. No bloating or gas. No reflux or indigestion Genitourinary: Negative for urgency, frequency, flank pain and difficulty urinating.  Musculoskeletal: Negative for myalgias, back pain, arthralgias and gait problem.  Neurological: Negative for dizziness, tremors, weakness and headaches.  Hematological: Negative for adenopathy.  Psychiatric/Behavioral: Negative for behavioral problems and dysphoric mood.       Objective:   Physical Exam Vitals reviewed and they are stable. HEENT right eye with clear C&S, pupil is reactive Pulmonary - normal respirations Cor - 2+ radial pulse and regular  Lab Results  Component Value Date   CHOL 202* 06/21/2011   TRIG 84.0 06/21/2011   HDL 54.70 06/21/2011   LDLDIRECT 142.9 06/21/2011   ALT 41 06/21/2011   AST 28 06/21/2011   NA 134* 06/21/2011   K 5.0 06/21/2011   CL 97 06/21/2011   CREATININE 1.0 06/21/2011   BUN 23  06/21/2011   CO2 29 06/21/2011   PSA 0.31 05/30/2006   HGBA1C 7.8* 06/21/2011           Assessment & Plan:   No problem-specific assessment & plan notes found for this encounter.

## 2011-07-03 NOTE — Assessment & Plan Note (Signed)
BP Readings from Last 3 Encounters:  07/01/11 120/80  03/24/11 110/68  02/24/11 104/64   Good control on present medications.

## 2011-07-03 NOTE — Assessment & Plan Note (Addendum)
A1C has been trending upward: Diabetes Brief Latest Ref Rng 07/01/2011 06/21/2011 02/24/2011 02/09/2011  HbA1c 4.6 - 6.5 %  7.8 (H)  7.6 (H)   Diabetes Brief Latest Ref Rng 10/09/2010 02/06/2010 10/10/2009 06/11/2009  HbA1c 4.6 - 6.5 % 7.2 (H) 6.8 (H) 6.8 (H) 6.9 (H)   Diabetes Brief Latest Ref Rng 10/14/2008 06/21/2008 02/29/2008 11/29/2007  HbA1c 4.6 - 6.5 % 7.1 (H) 6.9 (H) 7.1 (H) 7.3 (H)   Diabetes Brief Latest Ref Rng 08/11/2007 08/11/2007 05/18/2007 02/21/2007  HbA1c 4.6 - 6.5 % 6.9 (H) 6.9 7.4 (H) 7.5 (H)   Plan -  Continue low dose amaryl             Increase actos to 30 mg            Continue diet management             Recheck A1c in 4 months.

## 2011-07-03 NOTE — Assessment & Plan Note (Signed)
LDL cholesterol at 143 is above goal of 100. [name] is already following a good diet. He has had intolerance of simvastatin in the past. Discussed the medication options and that "statin" are most efficacious.  Plan - trial of crestor 10mg  once a day           Follow-up lab in 4 weeks

## 2011-10-08 ENCOUNTER — Other Ambulatory Visit (INDEPENDENT_AMBULATORY_CARE_PROVIDER_SITE_OTHER): Payer: Commercial Managed Care - PPO

## 2011-10-08 ENCOUNTER — Telehealth: Payer: Self-pay | Admitting: *Deleted

## 2011-10-08 DIAGNOSIS — I10 Essential (primary) hypertension: Secondary | ICD-10-CM

## 2011-10-08 DIAGNOSIS — E785 Hyperlipidemia, unspecified: Secondary | ICD-10-CM

## 2011-10-08 DIAGNOSIS — IMO0001 Reserved for inherently not codable concepts without codable children: Secondary | ICD-10-CM

## 2011-10-08 LAB — HEPATIC FUNCTION PANEL
Albumin: 4.3 g/dL (ref 3.5–5.2)
Alkaline Phosphatase: 44 U/L (ref 39–117)
Total Protein: 7.7 g/dL (ref 6.0–8.3)

## 2011-10-08 LAB — LIPID PANEL
Cholesterol: 148 mg/dL (ref 0–200)
HDL: 57.4 mg/dL (ref >39.00)
LDL Cholesterol: 80 mg/dL (ref 0–99)
Triglycerides: 52 mg/dL (ref 0.0–149.0)
VLDL: 10.4 mg/dL (ref 0.0–40.0)

## 2011-10-08 LAB — BASIC METABOLIC PANEL
CO2: 28 mEq/L (ref 19–32)
Chloride: 102 mEq/L (ref 96–112)
Potassium: 5.4 mEq/L — ABNORMAL HIGH (ref 3.5–5.1)

## 2011-10-08 LAB — HEMOGLOBIN A1C: Hgb A1c MFr Bld: 7.6 % — ABNORMAL HIGH (ref 4.6–6.5)

## 2011-10-08 NOTE — Telephone Encounter (Signed)
Lab orders

## 2011-10-11 ENCOUNTER — Encounter: Payer: Self-pay | Admitting: Internal Medicine

## 2011-10-15 ENCOUNTER — Ambulatory Visit (INDEPENDENT_AMBULATORY_CARE_PROVIDER_SITE_OTHER): Payer: Commercial Managed Care - PPO | Admitting: Internal Medicine

## 2011-10-15 ENCOUNTER — Encounter: Payer: Self-pay | Admitting: Internal Medicine

## 2011-10-15 VITALS — BP 138/70 | HR 66 | Temp 97.6°F | Wt 197.0 lb

## 2011-10-15 DIAGNOSIS — Z23 Encounter for immunization: Secondary | ICD-10-CM

## 2011-10-15 DIAGNOSIS — E1149 Type 2 diabetes mellitus with other diabetic neurological complication: Secondary | ICD-10-CM

## 2011-10-15 DIAGNOSIS — E119 Type 2 diabetes mellitus without complications: Secondary | ICD-10-CM

## 2011-10-15 DIAGNOSIS — E785 Hyperlipidemia, unspecified: Secondary | ICD-10-CM

## 2011-10-15 DIAGNOSIS — M79609 Pain in unspecified limb: Secondary | ICD-10-CM

## 2011-10-15 DIAGNOSIS — I10 Essential (primary) hypertension: Secondary | ICD-10-CM

## 2011-10-15 NOTE — Patient Instructions (Signed)
Diabetes - not at goal with an A1C 7.6% - but below the threshold for a mandatory change in medication. Option one - continue present medications and redouble effort on diet and exercise; option 2 - increase actos to 45 mg  Option 3 - consider basal insulin therapy with  lantus or levemir.   Cholesterol - very good control. An option is to take the crestor 3 times a week.  Leg pain/peripheral neuropathy - consider a trial of cymbalta starting at 30 mg once a day.   ED - trial of Viagra.

## 2011-10-18 MED ORDER — PIOGLITAZONE HCL 45 MG PO TABS: 45.0000 mg | ORAL_TABLET | Freq: Every day | ORAL | Status: DC

## 2011-10-18 NOTE — Assessment & Plan Note (Signed)
Continued pain, seperate from peripheral neuropathy. He has seen Dr. Dallas Schimke and diagnosed with metatarsalgia. No significant change in his discomfort.

## 2011-10-18 NOTE — Assessment & Plan Note (Signed)
Lab Results  Component Value Date   HGBA1C 7.6* 10/08/2011   Rising A1C despite meds and diet. Discussed various options and began the discussion about basal insulin therapy. He has been intolerant of metformin in the past.  Plan - continue amaryl at 4 mg daily           Increase Actos to 45 mg           Repeat A1C 3 months - if not better controlled will consider basal insulin therapy.

## 2011-10-18 NOTE — Assessment & Plan Note (Signed)
Continued pain from peripheral neuropathy. He is taking gabapentin with only some relief of his discomfort.  Plan- will continue present medications.

## 2011-10-18 NOTE — Assessment & Plan Note (Signed)
BP Readings from Last 3 Encounters:  10/15/11 138/70  07/01/11 120/80  03/24/11 110/68   Rising BP noted but he remains adequately controlled. Will make no changes in medications.

## 2011-10-18 NOTE — Assessment & Plan Note (Signed)
LDL not at goal the last several times it has been checked. He is working on a low fat diet. Tolerance of statin drugs has been a problem - has myalgias with several products.  Plan - d/c zocor           Trial of  crestor 10 mg qd

## 2011-10-18 NOTE — Progress Notes (Signed)
  Subjective:    Patient ID: Ian Johnson, male    DOB: 1956-10-02, 55 y.o.   MRN: [id number]  HPI Ian Johnson presents for follow-up of DM and lipids. His recent lab revealed a rising A1C despite multiple medications and an continued effort at a no sugar low fat and low carb diet. He has had some limitations in is physical activity. He is a bit discouraged. He has no specific medical c/o other than his chronic problems.  I have reviewed the patient's medical history in detail and updated the computerized patient record.    Review of Systems System review is negative for any constitutional, cardiac, pulmonary, GI or neuro symptoms or complaints other than as described in the HPI.     Objective:   Physical Exam Vitals noted - BP OK Gen'l WNWD white man in no distress HEENT - conjunctive clear OD, prosthesis left. PUlm - no increased WOB, normal respirations. Cor - 2+ radial pulse, RRR       Assessment & Plan:  Adjustments in medications made - he will return for lab in 3 months, instead of usual 4 month interval, to assess response and make adjustments in regimen as needed.

## 2012-02-11 ENCOUNTER — Other Ambulatory Visit (INDEPENDENT_AMBULATORY_CARE_PROVIDER_SITE_OTHER): Payer: Commercial Managed Care - PPO

## 2012-02-11 ENCOUNTER — Other Ambulatory Visit: Payer: Self-pay | Admitting: *Deleted

## 2012-02-11 DIAGNOSIS — E785 Hyperlipidemia, unspecified: Secondary | ICD-10-CM

## 2012-02-11 DIAGNOSIS — IMO0001 Reserved for inherently not codable concepts without codable children: Secondary | ICD-10-CM

## 2012-02-11 DIAGNOSIS — I10 Essential (primary) hypertension: Secondary | ICD-10-CM

## 2012-02-11 LAB — HEMOGLOBIN A1C: Hgb A1c MFr Bld: 7.4 % — ABNORMAL HIGH (ref 4.6–6.5)

## 2012-02-11 LAB — COMPREHENSIVE METABOLIC PANEL
AST: 29 U/L (ref 0–37)
Albumin: 4.3 g/dL (ref 3.5–5.2)
BUN: 18 mg/dL (ref 6–23)
Calcium: 9.2 mg/dL (ref 8.4–10.5)
Chloride: 103 mEq/L (ref 96–112)
Creatinine, Ser: 0.9 mg/dL (ref 0.4–1.5)
GFR: 95.96 mL/min (ref >60.00)
Glucose, Bld: 129 mg/dL — ABNORMAL HIGH (ref 70–99)

## 2012-02-11 LAB — LIPID PANEL
Cholesterol: 149 mg/dL (ref 0–200)
LDL Cholesterol: 79 mg/dL (ref 0–99)
Triglycerides: 67 mg/dL (ref 0.0–149.0)

## 2012-02-14 ENCOUNTER — Encounter: Payer: Self-pay | Admitting: Internal Medicine

## 2012-02-16 ENCOUNTER — Encounter: Payer: Self-pay | Admitting: Internal Medicine

## 2012-02-16 ENCOUNTER — Ambulatory Visit (INDEPENDENT_AMBULATORY_CARE_PROVIDER_SITE_OTHER): Payer: Commercial Managed Care - PPO | Admitting: Internal Medicine

## 2012-02-16 VITALS — BP 110/60 | HR 62 | Temp 98.2°F | Resp 16 | Wt 198.2 lb

## 2012-02-16 DIAGNOSIS — E785 Hyperlipidemia, unspecified: Secondary | ICD-10-CM

## 2012-02-16 DIAGNOSIS — E1142 Type 2 diabetes mellitus with diabetic polyneuropathy: Secondary | ICD-10-CM

## 2012-02-16 DIAGNOSIS — I10 Essential (primary) hypertension: Secondary | ICD-10-CM

## 2012-02-16 DIAGNOSIS — E1149 Type 2 diabetes mellitus with other diabetic neurological complication: Secondary | ICD-10-CM

## 2012-02-16 DIAGNOSIS — E119 Type 2 diabetes mellitus without complications: Secondary | ICD-10-CM

## 2012-02-16 MED ORDER — METFORMIN HCL 500 MG PO TABS: 500.0000 mg | ORAL_TABLET | Freq: Two times a day (BID) | ORAL | Status: DC

## 2012-02-18 NOTE — Progress Notes (Signed)
  Subjective:    Patient ID: Ian Johnson, male    DOB: June 10, 1956, 57 y.o.   MRN: [id number]  HPI Mr. Earhart presents to discuss and review his labs in regard to his diabetes. He has been doing ok. He reports that a relative with diabetes has been on metformin and he wonders if this might be good for him.  PMH, FamHx and SocHx reviewed for any changes and relevance.    Review of Systems System review is negative for any constitutional, cardiac, pulmonary, GI or neuro symptoms or complaints other than as described in the HPI.     Objective:   Physical Exam Filed Vitals:   02/16/12 1523  BP: 110/60  Pulse: 62  Temp: 98.2 F (36.8 C)  Resp: 16   Wt Readings from Last 3 Encounters:  02/16/12 198 lb 4 oz (89.926 kg)  10/15/11 197 lb (89.359 kg)  07/01/11 192 lb (87.091 kg)   Gen'l- WNWD white man in no distress Cor=RR Pulm - normal respirations. Neuro- A&O x 3, cognitively normal.       Assessment & Plan:

## 2012-02-18 NOTE — Assessment & Plan Note (Signed)
LDL is better than goal of 100 or less.  Plan - continue present medications

## 2012-02-18 NOTE — Assessment & Plan Note (Signed)
Lab Results  Component Value Date   HGBA1C 7.4* 02/11/2012   Close to goal of 7% or less and better than the last test - 7.6% Did review his meds and he has never been on metformin. He also could increase the Actos to 45 mg. A single med change is preferred.  Plan - will start metformin 500 mg bid (qd x 1 week)           Lab orders entered for q 16 weeks.

## 2012-02-18 NOTE — Assessment & Plan Note (Signed)
BP Readings from Last 3 Encounters:  02/16/12 110/60  10/15/11 138/70  07/01/11 120/80   Good control

## 2012-02-18 NOTE — Assessment & Plan Note (Signed)
Continued discomfort with peripheral edema, however he does not want to change his medical regimen at this time.

## 2012-03-22 ENCOUNTER — Other Ambulatory Visit: Payer: Self-pay | Admitting: *Deleted

## 2012-03-22 MED ORDER — GLIMEPIRIDE 2 MG PO TABS: 2.0000 mg | ORAL_TABLET | Freq: Every day | ORAL | Status: DC

## 2012-03-22 MED ORDER — OMEPRAZOLE 20 MG PO CPDR: 20.0000 mg | DELAYED_RELEASE_CAPSULE | Freq: Two times a day (BID) | ORAL | Status: DC

## 2012-03-22 MED ORDER — RAMIPRIL 10 MG PO TABS: 10.0000 mg | ORAL_TABLET | Freq: Every day | ORAL | Status: DC

## 2012-03-22 MED ORDER — ETODOLAC 500 MG PO TABS: 500.0000 mg | ORAL_TABLET | Freq: Two times a day (BID) | ORAL | Status: DC

## 2012-03-22 NOTE — Telephone Encounter (Signed)
Done

## 2012-05-17 ENCOUNTER — Emergency Department: Payer: Self-pay | Admitting: Emergency Medicine

## 2012-05-17 LAB — URINALYSIS, COMPLETE
Blood: NEGATIVE
Glucose,UR: NEGATIVE mg/dL (ref 0–75)
Ph: 7 (ref 4.5–8.0)
RBC,UR: 1 /HPF (ref 0–5)
Squamous Epithelial: NONE SEEN

## 2012-05-17 LAB — COMPREHENSIVE METABOLIC PANEL
Alkaline Phosphatase: 49 U/L — ABNORMAL LOW (ref 50–136)
Anion Gap: 12 (ref 7–16)
BUN: 20 mg/dL — ABNORMAL HIGH (ref 7–18)
Bilirubin,Total: 0.9 mg/dL (ref 0.2–1.0)
Calcium, Total: 9.3 mg/dL (ref 8.5–10.1)
Creatinine: 0.9 mg/dL (ref 0.60–1.30)
SGPT (ALT): 41 U/L
Total Protein: 7.6 g/dL (ref 6.4–8.2)

## 2012-05-17 LAB — CK TOTAL AND CKMB (NOT AT ARMC)
CK, Total: 270 U/L — ABNORMAL HIGH (ref 35–232)
CK-MB: 1.6 ng/mL (ref 0.5–3.6)

## 2012-05-17 LAB — CBC
HCT: 42 % (ref 40.0–52.0)
HGB: 14 g/dL (ref 13.0–18.0)
MCH: 30.8 pg (ref 26.0–34.0)
MCV: 92 fL (ref 80–100)

## 2012-05-17 LAB — TROPONIN I
Troponin-I: <0.02 ng/mL
Troponin-I: <0.02 ng/mL

## 2012-06-07 ENCOUNTER — Other Ambulatory Visit (INDEPENDENT_AMBULATORY_CARE_PROVIDER_SITE_OTHER): Payer: Commercial Managed Care - PPO

## 2012-06-07 DIAGNOSIS — E1149 Type 2 diabetes mellitus with other diabetic neurological complication: Secondary | ICD-10-CM

## 2012-06-07 DIAGNOSIS — E1142 Type 2 diabetes mellitus with diabetic polyneuropathy: Secondary | ICD-10-CM

## 2012-06-07 DIAGNOSIS — E785 Hyperlipidemia, unspecified: Secondary | ICD-10-CM

## 2012-06-07 LAB — HEPATIC FUNCTION PANEL
ALT: 36 U/L (ref 0–53)
Alkaline Phosphatase: 33 U/L — ABNORMAL LOW (ref 39–117)
Bilirubin, Direct: 0.1 mg/dL (ref 0.0–0.3)
Total Bilirubin: 1.1 mg/dL (ref 0.3–1.2)
Total Protein: 7.7 g/dL (ref 6.0–8.3)

## 2012-06-07 LAB — COMPREHENSIVE METABOLIC PANEL
AST: 31 U/L (ref 0–37)
Alkaline Phosphatase: 33 U/L — ABNORMAL LOW (ref 39–117)
BUN: 19 mg/dL (ref 6–23)
Calcium: 9.2 mg/dL (ref 8.4–10.5)
Creatinine, Ser: 0.7 mg/dL (ref 0.4–1.5)
Glucose, Bld: 141 mg/dL — ABNORMAL HIGH (ref 70–99)

## 2012-06-07 LAB — HEMOGLOBIN A1C: Hgb A1c MFr Bld: 6.9 % — ABNORMAL HIGH (ref 4.6–6.5)

## 2012-06-07 LAB — LIPID PANEL
Cholesterol: 156 mg/dL (ref 0–200)
HDL: 61.5 mg/dL (ref >39.00)
LDL Cholesterol: 85 mg/dL (ref 0–99)
Triglycerides: 48 mg/dL (ref 0.0–149.0)
VLDL: 9.6 mg/dL (ref 0.0–40.0)

## 2012-06-12 ENCOUNTER — Encounter: Payer: Self-pay | Admitting: Internal Medicine

## 2012-06-15 ENCOUNTER — Encounter: Payer: Self-pay | Admitting: Internal Medicine

## 2012-06-15 ENCOUNTER — Ambulatory Visit (INDEPENDENT_AMBULATORY_CARE_PROVIDER_SITE_OTHER): Payer: Commercial Managed Care - PPO | Admitting: Internal Medicine

## 2012-06-15 ENCOUNTER — Other Ambulatory Visit: Payer: Commercial Managed Care - PPO

## 2012-06-15 VITALS — BP 132/70 | HR 72 | Temp 97.2°F | Resp 16 | Ht 72.0 in | Wt 192.0 lb

## 2012-06-15 DIAGNOSIS — R252 Cramp and spasm: Secondary | ICD-10-CM

## 2012-06-15 DIAGNOSIS — E1149 Type 2 diabetes mellitus with other diabetic neurological complication: Secondary | ICD-10-CM

## 2012-06-15 DIAGNOSIS — R7401 Elevation of levels of liver transaminase levels: Secondary | ICD-10-CM

## 2012-06-15 DIAGNOSIS — E1142 Type 2 diabetes mellitus with diabetic polyneuropathy: Secondary | ICD-10-CM

## 2012-06-15 DIAGNOSIS — I10 Essential (primary) hypertension: Secondary | ICD-10-CM

## 2012-06-15 DIAGNOSIS — E119 Type 2 diabetes mellitus without complications: Secondary | ICD-10-CM

## 2012-06-15 DIAGNOSIS — E785 Hyperlipidemia, unspecified: Secondary | ICD-10-CM

## 2012-06-15 MED ORDER — HYDROCODONE-ACETAMINOPHEN 5-325 MG PO TABS: 1.0000 | ORAL_TABLET | Freq: Four times a day (QID) | ORAL | Status: DC | PRN

## 2012-06-15 MED ORDER — DULOXETINE HCL 30 MG PO CPEP: 30.0000 mg | ORAL_CAPSULE | Freq: Every day | ORAL | Status: DC

## 2012-06-15 NOTE — Progress Notes (Signed)
Subjective:    Patient ID: Ian Johnson, male    DOB: 18-Aug-1956, 56 y.o.   MRN: [id number]  HPI Ian Johnson presents for follow-up. Labs were done.  He had an illness several weeks ago that hung on a long time. He was seen at [facility] - multiple labs and x-ray were unrevealing. His symptoms finally resolved except for cough. He finally stopped Altace and cough resolved. He re-challenged himself with altace and cough returned.  He has had a lot of shoulder pain. He saw Dr. Lajoyce Corners who ordered MRI which revealed a torn rotator cuff. He is a candidate for surgical repair but in the meantime he is having a lot of pain.   His peripheral neuropathy and leg cramps are as bad as ever. He is up several nights a week with these pains. He has continued with neurontin and has tried everything he can get his hands on for relief. He has never been on cymbalta.  Due to the above factors he is working on early retirement to less taxing activity.   Past Medical History  Diagnosis Date  . Personal history of urinary calculi   . Unspecified essential hypertension   . Diabetes mellitus without mention of complication   . Nonspecific elevation of levels of transaminase or lactic acid dehydrogenase (LDH)   . Backache, unspecified   . Other and unspecified hyperlipidemia   . Esophageal reflux    Past Surgical History  Procedure Date  . Lumbar disc surgery     l4 and l5   Family History  Problem Relation Age of Onset  . Coronary artery disease Mother   . Coronary artery disease Father   . Peripheral vascular disease Father    History   Social History  . Marital Status: Married    Spouse Name: N/A    Number of Children: 2  . Years of Education: 12   Occupational History  . Archivist    Social History Main Topics  . Smoking status: Never Smoker   . Smokeless tobacco: Never Used  . Alcohol Use: No  . Drug Use: No  . Sexually Active: Yes -- Male partner(s)   Other Topics Concern  . Not on  file   Social History Narrative   HSG. married 7 years - divorced; remarried /93. 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        Review of Systems System review is negative for any constitutional, cardiac, pulmonary, GI or neuro symptoms or complaints other than as described in the HPI.     Objective:   Physical Exam Filed Vitals:   06/15/12 1545  BP: 132/70  Pulse: 72  Temp: 97.2 F (36.2 C)  Resp: 16   Gen'l- WNWD white man in no distress HEENT- OS prosthetic. OD- normal pupil, C&S clear Cor- RRR, 2+ pulse Pulm - normal respirations. Neuro - A&O x 3, CN II-XII normal except for prosthetic OS.  Lab Results  Component Value Date   GLUCOSE 141* 06/07/2012   CHOL 156 06/07/2012   TRIG 48.0 06/07/2012   HDL 61.50 06/07/2012   LDLDIRECT 142.9 06/21/2011   LDLCALC 85 06/07/2012        ALT 36 06/07/2012        AST 31 06/07/2012   NA 140 06/07/2012   K 4.6 06/07/2012   CL 104 06/07/2012   CREATININE 0.7 06/07/2012   BUN 19 06/07/2012   CO2 29 06/07/2012   PSA 0.31  05/30/2006   HGBA1C 6.9* 06/07/2012   MICROALBUR 0.2 06/15/2012          Assessment & Plan:

## 2012-06-15 NOTE — Patient Instructions (Addendum)
1. Diabetes - better control at 6.9%. Will need to check microalbuminuria - an early warning sign of diabetic related kidney damage. If this is elevated than we will need to replace the Altace, stopped due to cough, with a different drug in the RAS class to continue to protect the kidney.  2. Neuropathy - it is worthwhile to try cymbalta for relief of peripheral neuropathy related discomfort. Will start at 30 mg daily (14 days of samples) and if tolerated advance to 60 mg. Maximum dose is 120 mg daily.  3. Erectile dysfunction - if the levitra isn't working at all it may be a different type of neuropathy. There are several other treatments that work including Cavernous injection or implants. At this point I would recommend seeing a urologist about this ([facility] has several urologists).  4. Rotator cuff tear - continue the etodolac and the voltaren gel. In the short term we can [place] a narcotic pain medication. This can be very effective but we will need to be careful about dependence. Recommend starting with hydrocodone/APAP 5/325 every 6 hours as needed. Would not want to use this for more than several months. Surgery is going to be the durable solution.   5. Cholesterol levels and other labs are all normal.

## 2012-06-16 LAB — MICROALBUMIN / CREATININE URINE RATIO
Creatinine,U: 84.1 mg/dL
Microalb Creat Ratio: 0.2 mg/g (ref 0.0–30.0)
Microalb, Ur: 0.2 mg/dL (ref 0.0–1.9)

## 2012-06-18 ENCOUNTER — Encounter: Payer: Self-pay | Admitting: Internal Medicine

## 2012-06-18 DIAGNOSIS — R252 Cramp and spasm: Secondary | ICD-10-CM | POA: Insufficient documentation

## 2012-06-18 NOTE — Assessment & Plan Note (Addendum)
BP Readings from Last 3 Encounters:  06/15/12 132/70  02/16/12 110/60  10/15/11 138/70   Adequate control of lisinopril. He states lisinopril was started for renal protection in diabetic not for BP control. His cough did clear coming of ACE-I  Plan No addition of  Medication.  Lab for microalbuminuria - consider ARB if elevated.  Addendum - no microalbuminuria.

## 2012-06-18 NOTE — Assessment & Plan Note (Signed)
Lab Results  Component Value Date        ALT 36 06/07/2012   AST 31 06/07/2012        ALKPHOS 33* 06/07/2012        BILITOT 1.1 06/07/2012        LFTs have normalized.

## 2012-06-18 NOTE — Assessment & Plan Note (Signed)
LDL better than goal of 100 or less. LFTs normal  Plan - continue Crestor 10 mg daily

## 2012-06-18 NOTE — Assessment & Plan Note (Signed)
A1C is at goal - less than 7% after the last several readings being above goal.

## 2012-06-19 ENCOUNTER — Other Ambulatory Visit: Payer: Self-pay | Admitting: *Deleted

## 2012-06-19 MED ORDER — PIOGLITAZONE HCL 30 MG PO TABS: 30.0000 mg | ORAL_TABLET | Freq: Every day | ORAL | Status: DC

## 2012-06-19 MED ORDER — METFORMIN HCL 500 MG PO TABS: 500.0000 mg | ORAL_TABLET | Freq: Two times a day (BID) | ORAL | Status: DC

## 2012-06-19 MED ORDER — GABAPENTIN 300 MG PO CAPS: ORAL_CAPSULE | ORAL | Status: DC

## 2012-06-19 MED ORDER — ETODOLAC 500 MG PO TABS: 500.0000 mg | ORAL_TABLET | Freq: Two times a day (BID) | ORAL | Status: DC

## 2012-06-19 MED ORDER — ROSUVASTATIN CALCIUM 10 MG PO TABS: 10.0000 mg | ORAL_TABLET | Freq: Every day | ORAL | Status: DC

## 2012-06-19 MED ORDER — OMEPRAZOLE 20 MG PO CPDR: 20.0000 mg | DELAYED_RELEASE_CAPSULE | Freq: Two times a day (BID) | ORAL | Status: DC

## 2012-06-19 MED ORDER — GLIMEPIRIDE 2 MG PO TABS: 2.0000 mg | ORAL_TABLET | Freq: Every day | ORAL | Status: DC

## 2012-06-19 NOTE — Telephone Encounter (Signed)
Refills sent to [facility].

## 2012-06-20 ENCOUNTER — Telehealth: Payer: Self-pay | Admitting: *Deleted

## 2012-06-20 NOTE — Telephone Encounter (Signed)
Patient notified of instruction and lab results. Patient states has been ok taking pain medication hydrocodone. Requesting refill on this.

## 2012-06-20 NOTE — Telephone Encounter (Signed)
Ok for refill on norco #90 2 refills. Has he had a chance to try cymbalta for neuropathy?

## 2012-06-20 NOTE — Telephone Encounter (Signed)
Message copied by Elnora Morrison on Tue Jun 20, 2012  1:37 PM ------      Message from: Jacques Navy      Created: Sun Jun 18, 2012  4:34 AM       Call patient - microablumin levels very low. OK to remain off lisinopril

## 2012-06-21 MED ORDER — HYDROCODONE-ACETAMINOPHEN 5-325 MG PO TABS
1.0000 | ORAL_TABLET | Freq: Four times a day (QID) | ORAL | Status: AC | PRN
Start: 2012-06-21 — End: 2012-07-01

## 2012-06-21 NOTE — Telephone Encounter (Signed)
Left message on cell number of Rx called to [facility]. Left message for patient to call back about using cymbalta

## 2012-06-22 ENCOUNTER — Telehealth: Payer: Self-pay | Admitting: *Deleted

## 2012-06-22 NOTE — Telephone Encounter (Signed)
Patient return call. States has not used cymbalta yet . Only wanted to try one med at a time before starting new medication.

## 2012-06-29 ENCOUNTER — Telehealth: Payer: Self-pay | Admitting: *Deleted

## 2012-06-29 NOTE — Telephone Encounter (Signed)
error 

## 2012-10-06 ENCOUNTER — Other Ambulatory Visit (INDEPENDENT_AMBULATORY_CARE_PROVIDER_SITE_OTHER): Payer: Commercial Managed Care - PPO

## 2012-10-06 ENCOUNTER — Encounter: Payer: Self-pay | Admitting: Internal Medicine

## 2012-10-06 DIAGNOSIS — E785 Hyperlipidemia, unspecified: Secondary | ICD-10-CM

## 2012-10-06 DIAGNOSIS — E1149 Type 2 diabetes mellitus with other diabetic neurological complication: Secondary | ICD-10-CM

## 2012-10-06 LAB — COMPREHENSIVE METABOLIC PANEL
ALT: 35 U/L (ref 0–53)
AST: 29 U/L (ref 0–37)
CO2: 31 mEq/L (ref 19–32)
Creatinine, Ser: 0.9 mg/dL (ref 0.4–1.5)
GFR: 88.65 mL/min (ref >60.00)
Sodium: 140 mEq/L (ref 135–145)
Total Bilirubin: 0.7 mg/dL (ref 0.3–1.2)
Total Protein: 8.1 g/dL (ref 6.0–8.3)

## 2012-10-06 LAB — HEPATIC FUNCTION PANEL
ALT: 35 U/L (ref 0–53)
AST: 29 U/L (ref 0–37)
Albumin: 4.4 g/dL (ref 3.5–5.2)
Alkaline Phosphatase: 38 U/L — ABNORMAL LOW (ref 39–117)
Total Protein: 8.1 g/dL (ref 6.0–8.3)

## 2012-10-06 LAB — LIPID PANEL
HDL: 65.4 mg/dL (ref >39.00)
LDL Cholesterol: 101 mg/dL — ABNORMAL HIGH (ref 0–99)
Total CHOL/HDL Ratio: 3
Triglycerides: 110 mg/dL (ref 0.0–149.0)

## 2012-10-09 ENCOUNTER — Ambulatory Visit (INDEPENDENT_AMBULATORY_CARE_PROVIDER_SITE_OTHER): Payer: 59 | Admitting: Internal Medicine

## 2012-10-09 ENCOUNTER — Encounter: Payer: Self-pay | Admitting: Internal Medicine

## 2012-10-09 VITALS — BP 140/80 | HR 78 | Temp 97.1°F | Resp 16 | Wt 186.0 lb

## 2012-10-09 DIAGNOSIS — Z23 Encounter for immunization: Secondary | ICD-10-CM

## 2012-10-09 DIAGNOSIS — I10 Essential (primary) hypertension: Secondary | ICD-10-CM

## 2012-10-09 DIAGNOSIS — R252 Cramp and spasm: Secondary | ICD-10-CM

## 2012-10-09 DIAGNOSIS — E1149 Type 2 diabetes mellitus with other diabetic neurological complication: Secondary | ICD-10-CM

## 2012-10-09 DIAGNOSIS — E119 Type 2 diabetes mellitus without complications: Secondary | ICD-10-CM

## 2012-10-09 DIAGNOSIS — E785 Hyperlipidemia, unspecified: Secondary | ICD-10-CM

## 2012-10-09 MED ORDER — GABAPENTIN 300 MG PO CAPS: ORAL_CAPSULE | ORAL | Status: DC

## 2012-10-09 NOTE — Patient Instructions (Addendum)
Peripheral neuropathy - will complete paperwork. Will schedule nerve conduction study at [facility] to get hard evidence of peripheral neuropathy for disability purposes. Ok to increase gabapentin to 300 mg three times a day and we can increase further if needed.   Cramps - google up the resources provided and search "cinchona" the plant origin of quinine.   Diabetes - last A1C 7.1 % OK.  Lipids are also OK with an LDL 101 - letter is on the way.

## 2012-10-09 NOTE — Progress Notes (Signed)
Subjective:    Patient ID: Ian Johnson, male    DOB: 11-30-1956, 56 y.o.   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 He continues to have marked amount of pain in the feet and legs despite use of gabapentin. He also has continued and severe foot and leg cramps that have not responded to tonic water, ginko or gabapentin.  Past Medical History  Diagnosis Date  . Personal history of urinary calculi   . Unspecified essential hypertension   . Diabetes mellitus without mention of complication   . Nonspecific elevation of levels of transaminase or lactic acid dehydrogenase (LDH)   . Backache, unspecified   . Other and unspecified hyperlipidemia   . Esophageal reflux    Past Surgical History  Procedure Date  . Lumbar disc surgery     l4 and l5   Family History  Problem Relation Age of Onset  . Coronary artery disease Mother   . Coronary artery disease Father   . Peripheral vascular disease Father    History   Social History  . Marital Status: Married    Spouse Name: N/A    Number of Children: 2  . Years of Education: 12   Occupational History  . Archivist    Social History Main Topics  . Smoking status: Never Smoker   . Smokeless tobacco: Never Used  . Alcohol Use: No  . Drug Use: No  . Sexually Active: Yes -- Male partner(s)   Other Topics Concern  . Not on file   Social History Narrative   HSG. married 7 years - divorced; remarried /93. 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    Current Outpatient Prescriptions on File Prior to Visit  Medication Sig Dispense Refill  . Ascorbic Acid (VITAMIN C) 500 MG CAPS Take 1 each by mouth daily.      Marland Kitchen aspirin 81 MG EC tablet Take 81 mg by mouth daily.        . Coenzyme Q10 (CO Q-10) 100 MG CAPS Take 200 mg by mouth daily.        Marland Kitchen etodolac (LODINE) 500 MG tablet Take 1  tablet (500 mg total) by mouth 2 (two) times daily.  180 tablet  3  . fish oil-omega-3 fatty acids 1000 MG capsule Take 2 g by mouth daily. 1200 mg daily.      Marland Kitchen gabapentin (NEURONTIN) 100 MG tablet Take 1 tablet (100 mg total) by mouth 3 (three) times daily. Leg Cramps  270 tablet  3  . Ginkgo Biloba 60 MG CAPS Take 1 each by mouth daily.      Marland Kitchen glimepiride (AMARYL) 2 MG tablet Take 1 tablet (2 mg total) by mouth daily.  90 tablet  3  . metFORMIN (GLUCOPHAGE) 500 MG tablet Take 1 tablet (500 mg total) by mouth 2 (two) times daily with a meal.  180 tablet  3  . Multiple Vitamin (MULTIVITAMIN) tablet Take 1 tablet by mouth daily.        Marland Kitchen omeprazole (PRILOSEC) 20 MG capsule Take 1 capsule (20 mg total) by mouth 2 (two) times daily.  180 capsule  3  . pioglitazone (ACTOS) 30 MG tablet Take 1 tablet (30 mg total) by mouth daily.  90 tablet  3  . rosuvastatin (CRESTOR) 10 MG tablet Take 1 tablet (10  mg total) by mouth daily.  90 tablet  3  . DULoxetine (CYMBALTA) 30 MG capsule Take 1 capsule (30 mg total) by mouth daily. 14 day trial with samples  30 capsule  11      Review of Systems System review is negative for any constitutional, cardiac, pulmonary, GI or neuro symptoms or complaints other than as described in the HPI.     Objective:   Physical Exam Filed Vitals:   10/09/12 1514  BP: 140/80  Pulse: 78  Temp: 97.1 F (36.2 C)  Resp: 16   Gen'l- WNWD white man in no distress HEENT- prosthesis Left orbit; right C&S clear, PERRLA Cor- RRR Pulm - normal respirations Neuro - A&O x 3, normal gait and normal strength - able to stand w/o assist.       Assessment & Plan:

## 2012-10-10 NOTE — Assessment & Plan Note (Signed)
Lab Results  Component Value Date   HGBA1C 7.1* 10/06/2012   Adequate control on present medications.

## 2012-10-10 NOTE — Assessment & Plan Note (Signed)
Last LDL 101 just at goal  Plan  continue crestor  Dietary adherence

## 2012-10-10 NOTE — Assessment & Plan Note (Addendum)
Patient with severe peripheral neuropathy - long standing that has been refractory to treatment. At this point due to pain and loss of sensation in his feet it is unsafe for him to climb ladders, walk on planking or work at heights.  Plan - will assist with disability forms  Will schedule NCS for definitive diagnosis and to r/o central causation

## 2012-10-10 NOTE — Assessment & Plan Note (Signed)
Continued problem. He did not tolerate cymbalta due to side effects. He is not getting relief with gabapentin. Did google search: located a [employer] product page [email or web address] and found many listings for [employer] products.  Plan - no official endorsement of any products located but did inform patient that quinine comes from the [employer] tree and he may want to investigate further.

## 2012-10-10 NOTE — Assessment & Plan Note (Signed)
BP Readings from Last 3 Encounters:  10/09/12 140/80  06/15/12 132/70  02/16/12 110/60   Adequate control on present regimen

## 2012-10-16 ENCOUNTER — Ambulatory Visit: Payer: Commercial Managed Care - PPO | Admitting: Internal Medicine

## 2012-10-20 ENCOUNTER — Telehealth: Payer: Self-pay | Admitting: Internal Medicine

## 2012-10-20 NOTE — Telephone Encounter (Signed)
Pt has appt with [facility]                            [address]                             [place] [address]                             [phone]  appt 10/24/12 @ 930am.pt is ware per Clydie Braun ([facility])

## 2012-11-01 ENCOUNTER — Telehealth: Payer: Self-pay | Admitting: Internal Medicine

## 2012-11-01 NOTE — Telephone Encounter (Signed)
Forward 12 pages from [facility] @ [facility] to Dr. Illene Regulus for review on 11-01-12 ym

## 2012-11-17 ENCOUNTER — Encounter: Payer: Self-pay | Admitting: Internal Medicine

## 2013-01-15 ENCOUNTER — Encounter (HOSPITAL_BASED_OUTPATIENT_CLINIC_OR_DEPARTMENT_OTHER): Payer: Self-pay | Admitting: *Deleted

## 2013-01-15 NOTE — Progress Notes (Signed)
Pt will come in for bmet-ekg-bring crutches

## 2013-01-17 ENCOUNTER — Other Ambulatory Visit: Payer: Self-pay

## 2013-01-17 ENCOUNTER — Encounter (HOSPITAL_BASED_OUTPATIENT_CLINIC_OR_DEPARTMENT_OTHER)
Admission: RE | Admit: 2013-01-17 | Discharge: 2013-01-17 | Disposition: A | Discharge status: Home / Self Care | Payer: Commercial Managed Care - PPO | Source: Ambulatory Visit | Attending: Orthopedic Surgery | Admitting: Orthopedic Surgery

## 2013-01-17 LAB — BASIC METABOLIC PANEL
CO2: 28 mEq/L (ref 19–32)
Calcium: 9.7 mg/dL (ref 8.4–10.5)
Creatinine, Ser: 0.86 mg/dL (ref 0.50–1.35)
GFR calc non Af Amer: >90 mL/min (ref >90)

## 2013-01-19 ENCOUNTER — Encounter (HOSPITAL_BASED_OUTPATIENT_CLINIC_OR_DEPARTMENT_OTHER)
Admission: RE | Disposition: A | Discharge status: Home / Self Care | Payer: Self-pay | Source: Ambulatory Visit | Attending: Orthopedic Surgery

## 2013-01-19 ENCOUNTER — Ambulatory Visit (HOSPITAL_BASED_OUTPATIENT_CLINIC_OR_DEPARTMENT_OTHER): Payer: Commercial Managed Care - PPO | Admitting: Anesthesiology

## 2013-01-19 ENCOUNTER — Encounter (HOSPITAL_BASED_OUTPATIENT_CLINIC_OR_DEPARTMENT_OTHER): Payer: Self-pay | Admitting: Anesthesiology

## 2013-01-19 ENCOUNTER — Ambulatory Visit (HOSPITAL_BASED_OUTPATIENT_CLINIC_OR_DEPARTMENT_OTHER)
Admission: RE | Admit: 2013-01-19 | Discharge: 2013-01-19 | Disposition: A | Discharge status: Home / Self Care | Payer: Commercial Managed Care - PPO | Source: Ambulatory Visit | Attending: Orthopedic Surgery | Admitting: Orthopedic Surgery

## 2013-01-19 DIAGNOSIS — E119 Type 2 diabetes mellitus without complications: Secondary | ICD-10-CM | POA: Insufficient documentation

## 2013-01-19 DIAGNOSIS — K219 Gastro-esophageal reflux disease without esophagitis: Secondary | ICD-10-CM | POA: Insufficient documentation

## 2013-01-19 DIAGNOSIS — Z7982 Long term (current) use of aspirin: Secondary | ICD-10-CM | POA: Insufficient documentation

## 2013-01-19 DIAGNOSIS — Z01810 Encounter for preprocedural cardiovascular examination: Secondary | ICD-10-CM | POA: Insufficient documentation

## 2013-01-19 DIAGNOSIS — S83232A Complex tear of medial meniscus, current injury, left knee, initial encounter: Secondary | ICD-10-CM

## 2013-01-19 DIAGNOSIS — I10 Essential (primary) hypertension: Secondary | ICD-10-CM | POA: Insufficient documentation

## 2013-01-19 DIAGNOSIS — M2240 Chondromalacia patellae, unspecified knee: Secondary | ICD-10-CM | POA: Insufficient documentation

## 2013-01-19 DIAGNOSIS — Z01812 Encounter for preprocedural laboratory examination: Secondary | ICD-10-CM | POA: Insufficient documentation

## 2013-01-19 DIAGNOSIS — M23329 Other meniscus derangements, posterior horn of medial meniscus, unspecified knee: Secondary | ICD-10-CM | POA: Insufficient documentation

## 2013-01-19 DIAGNOSIS — Z79899 Other long term (current) drug therapy: Secondary | ICD-10-CM | POA: Insufficient documentation

## 2013-01-19 HISTORY — DX: Complex tear of medial meniscus, current injury, left knee, initial encounter: S83.232A

## 2013-01-19 HISTORY — PX: CHONDROPLASTY: SHX5177

## 2013-01-19 HISTORY — DX: Presence of spectacles and contact lenses: Z97.3

## 2013-01-19 HISTORY — DX: Blindness, one eye, unspecified eye: H54.40

## 2013-01-19 HISTORY — PX: KNEE ARTHROSCOPY WITH MEDIAL MENISECTOMY: SHX5651

## 2013-01-19 HISTORY — DX: Unspecified osteoarthritis, unspecified site: M19.90

## 2013-01-19 HISTORY — DX: Polyneuropathy, unspecified: G62.9

## 2013-01-19 LAB — POCT HEMOGLOBIN-HEMACUE: Hemoglobin: 14.5 g/dL (ref 13.0–17.0)

## 2013-01-19 LAB — GLUCOSE, CAPILLARY

## 2013-01-19 SURGERY — ARTHROSCOPY, KNEE, WITH MEDIAL MENISCECTOMY
Anesthesia: General | Site: Knee | Laterality: Left | Wound class: Clean

## 2013-01-19 MED ORDER — HYDROMORPHONE HCL PF 1 MG/ML IJ SOLN
0.2500 mg | INTRAMUSCULAR | Status: DC | PRN
  Administered 2013-01-19 (×2): 0.5 mg via INTRAVENOUS

## 2013-01-19 MED ORDER — CEFAZOLIN SODIUM-DEXTROSE 2-3 GM-% IV SOLR
2.0000 g | INTRAVENOUS | Status: AC
  Administered 2013-01-19: 2 g via INTRAVENOUS

## 2013-01-19 MED ORDER — MIDAZOLAM HCL 2 MG/2ML IJ SOLN: 1.0000 mg | INTRAMUSCULAR | Status: DC | PRN

## 2013-01-19 MED ORDER — KETOROLAC TROMETHAMINE 10 MG PO TABS: 10.0000 mg | ORAL_TABLET | Freq: Four times a day (QID) | ORAL | Status: DC | PRN

## 2013-01-19 MED ORDER — SODIUM CHLORIDE 0.9 % IR SOLN
Status: DC | PRN
  Administered 2013-01-19: 6000 mL

## 2013-01-19 MED ORDER — ACETAMINOPHEN 10 MG/ML IV SOLN
1000.0000 mg | Freq: Once | INTRAVENOUS | Status: AC
  Administered 2013-01-19: 1000 mg via INTRAVENOUS

## 2013-01-19 MED ORDER — KETOROLAC TROMETHAMINE 30 MG/ML IJ SOLN
INTRAMUSCULAR | Status: DC | PRN
  Administered 2013-01-19: 30 mg via INTRAVENOUS

## 2013-01-19 MED ORDER — FENTANYL CITRATE 0.05 MG/ML IJ SOLN
INTRAMUSCULAR | Status: DC | PRN
  Administered 2013-01-19: 25 ug via INTRAVENOUS
  Administered 2013-01-19: 100 ug via INTRAVENOUS
  Administered 2013-01-19: 25 ug via INTRAVENOUS

## 2013-01-19 MED ORDER — MIDAZOLAM HCL 5 MG/5ML IJ SOLN
INTRAMUSCULAR | Status: DC | PRN
  Administered 2013-01-19: 2 mg via INTRAVENOUS

## 2013-01-19 MED ORDER — PROPOFOL 10 MG/ML IV BOLUS
INTRAVENOUS | Status: DC | PRN
  Administered 2013-01-19: 200 mg via INTRAVENOUS

## 2013-01-19 MED ORDER — PROMETHAZINE HCL 25 MG PO TABS: 25.0000 mg | ORAL_TABLET | Freq: Four times a day (QID) | ORAL | Status: DC | PRN

## 2013-01-19 MED ORDER — BUPIVACAINE HCL 0.25 % IJ SOLN
INTRAMUSCULAR | Status: DC | PRN
  Administered 2013-01-19: 20 mL

## 2013-01-19 MED ORDER — FENTANYL CITRATE 0.05 MG/ML IJ SOLN: 50.0000 ug | INTRAMUSCULAR | Status: DC | PRN

## 2013-01-19 MED ORDER — OXYCODONE-ACETAMINOPHEN 5-325 MG PO TABS: 1.0000 | ORAL_TABLET | Freq: Four times a day (QID) | ORAL | Status: DC | PRN

## 2013-01-19 MED ORDER — OXYCODONE HCL 5 MG/5ML PO SOLN: 5.0000 mg | Freq: Once | ORAL | Status: AC | PRN

## 2013-01-19 MED ORDER — SENNA-DOCUSATE SODIUM 8.6-50 MG PO TABS: 1.0000 | ORAL_TABLET | Freq: Every day | ORAL | Status: DC

## 2013-01-19 MED ORDER — DEXAMETHASONE SODIUM PHOSPHATE 4 MG/ML IJ SOLN
INTRAMUSCULAR | Status: DC | PRN
  Administered 2013-01-19: 10 mg via INTRAVENOUS

## 2013-01-19 MED ORDER — ONDANSETRON HCL 4 MG/2ML IJ SOLN
INTRAMUSCULAR | Status: DC | PRN
  Administered 2013-01-19: 4 mg via INTRAVENOUS

## 2013-01-19 MED ORDER — LIDOCAINE HCL (CARDIAC) 20 MG/ML IV SOLN
INTRAVENOUS | Status: DC | PRN
  Administered 2013-01-19: 60 mg via INTRAVENOUS

## 2013-01-19 MED ORDER — LACTATED RINGERS IV SOLN
INTRAVENOUS | Status: DC
  Administered 2013-01-19: 07:00:00 via INTRAVENOUS

## 2013-01-19 MED ORDER — OXYCODONE HCL 5 MG PO TABS
5.0000 mg | ORAL_TABLET | Freq: Once | ORAL | Status: AC | PRN
  Administered 2013-01-19: 5 mg via ORAL

## 2013-01-19 SURGICAL SUPPLY — 42 items
APL SKNCLS STERI-STRIP NONHPOA (GAUZE/BANDAGES/DRESSINGS) ×1
BANDAGE ELASTIC 6 VELCRO ST LF (GAUZE/BANDAGES/DRESSINGS) ×2 IMPLANT
BANDAGE ESMARK 6X9 LF (GAUZE/BANDAGES/DRESSINGS) IMPLANT
BENZOIN TINCTURE PRP APPL 2/3 (GAUZE/BANDAGES/DRESSINGS) ×2 IMPLANT
BLADE CUTTER GATOR 3.5 (BLADE) ×2 IMPLANT
BNDG CMPR 9X6 STRL LF SNTH (GAUZE/BANDAGES/DRESSINGS)
BNDG ESMARK 6X9 LF (GAUZE/BANDAGES/DRESSINGS)
CANISTER OMNI JUG 16 LITER (MISCELLANEOUS) ×2 IMPLANT
CANISTER SUCTION 2500CC (MISCELLANEOUS) IMPLANT
CLOTH BEACON ORANGE TIMEOUT ST (SAFETY) ×2 IMPLANT
CUFF TOURNIQUET SINGLE 34IN LL (TOURNIQUET CUFF) IMPLANT
CUTTER KNOT PUSHER 2-0 FIBERWI (INSTRUMENTS) IMPLANT
CUTTER MENISCUS  4.2MM (BLADE)
CUTTER MENISCUS 4.2MM (BLADE) IMPLANT
DRAPE ARTHROSCOPY W/POUCH 90 (DRAPES) ×2 IMPLANT
DURAPREP 26ML APPLICATOR (WOUND CARE) ×2 IMPLANT
ELECT MENISCUS 165MM 90D (ELECTRODE) IMPLANT
ELECT REM PT RETURN 9FT ADLT (ELECTROSURGICAL) ×2
ELECTRODE REM PT RTRN 9FT ADLT (ELECTROSURGICAL) IMPLANT
GLOVE BIO SURGEON STRL SZ8 (GLOVE) ×2 IMPLANT
GLOVE BIOGEL PI IND STRL 8 (GLOVE) ×2 IMPLANT
GLOVE BIOGEL PI INDICATOR 8 (GLOVE) ×2
GLOVE ORTHO TXT STRL SZ7.5 (GLOVE) ×2 IMPLANT
GLOVE SKINSENSE NS SZ7.0 (GLOVE) ×1
GLOVE SKINSENSE STRL SZ7.0 (GLOVE) IMPLANT
GOWN BRE IMP PREV XXLGXLNG (GOWN DISPOSABLE) ×4 IMPLANT
HOLDER KNEE FOAM BLUE (MISCELLANEOUS) ×2 IMPLANT
IV NS IRRIG 3000ML ARTHROMATIC (IV SOLUTION) ×4 IMPLANT
KNEE WRAP E Z 3 GEL PACK (MISCELLANEOUS) ×2 IMPLANT
PACK ARTHROSCOPY DSU (CUSTOM PROCEDURE TRAY) ×2 IMPLANT
PACK BASIN DAY SURGERY FS (CUSTOM PROCEDURE TRAY) ×2 IMPLANT
PENCIL BUTTON HOLSTER BLD 10FT (ELECTRODE) IMPLANT
SET ARTHROSCOPY TUBING (MISCELLANEOUS) ×2
SET ARTHROSCOPY TUBING LN (MISCELLANEOUS) ×1 IMPLANT
SLEEVE SCD COMPRESS KNEE MED (MISCELLANEOUS) ×1 IMPLANT
SPONGE GAUZE 4X4 12PLY (GAUZE/BANDAGES/DRESSINGS) ×2 IMPLANT
STRIP CLOSURE SKIN 1/2X4 (GAUZE/BANDAGES/DRESSINGS) ×2 IMPLANT
SUT MNCRL AB 4-0 PS2 18 (SUTURE) ×2 IMPLANT
TOWEL OR 17X24 6PK STRL BLUE (TOWEL DISPOSABLE) ×2 IMPLANT
TOWEL OR NON WOVEN STRL DISP B (DISPOSABLE) ×2 IMPLANT
WAND STAR VAC 90 (SURGICAL WAND) IMPLANT
WATER STERILE IRR 1000ML POUR (IV SOLUTION) ×2 IMPLANT

## 2013-01-19 NOTE — Anesthesia Procedure Notes (Signed)
Procedure Name: LMA Insertion Performed by: Yesika Rispoli W Pre-anesthesia Checklist: Patient identified, Timeout performed, Emergency Drugs available, Suction available and Patient being monitored Patient Re-evaluated:Patient Re-evaluated prior to inductionOxygen Delivery Method: Circle system utilized Preoxygenation: Pre-oxygenation with 100% oxygen Intubation Type: IV induction Ventilation: Mask ventilation without difficulty LMA: LMA inserted LMA Size: 4.0 Number of attempts: 1 Tube secured with: Tape Dental Injury: Teeth and Oropharynx as per pre-operative assessment      

## 2013-01-19 NOTE — Transfer of Care (Signed)
Immediate Anesthesia Transfer of Care Note  Patient: Ian Johnson  Procedure(s) Performed: Procedure(s) with comments: KNEE ARTHROSCOPY WITH MEDIAL MENISECTOMY (Left) - LEFT KNEE SCOPE WITH DEBRIDEMENT AND partial MEDIAL MENISECTOMY CHONDROPLASTY (Left) - ARTHROSCOPY KNEE WITH DEBRIDEMENT/SHAVING (CHONDROPLASTY)  Patient Location: PACU  Anesthesia Type:General  Level of Consciousness: awake  Airway & Oxygen Therapy: Patient Spontanous Breathing and Patient connected to face mask oxygen  Post-op Assessment: Report given to PACU RN and Post -op Vital signs reviewed and stable  Post vital signs: Reviewed and stable  Complications: No apparent anesthesia complications

## 2013-01-19 NOTE — Anesthesia Preprocedure Evaluation (Signed)
Anesthesia Evaluation  Patient identified by MRN, date of birth, ID band Patient awake    Reviewed: Allergy & Precautions, H&P , NPO status , Patient's Chart, lab work & pertinent test results  Airway Mallampati: II TM Distance: >3 FB Neck ROM: Full    Dental no notable dental hx. (+) Teeth Intact and Dental Advisory Given   Pulmonary neg pulmonary ROS,  breath sounds clear to auscultation  Pulmonary exam normal       Cardiovascular hypertension, On Medications negative cardio ROS  Rhythm:Regular Rate:Normal     Neuro/Psych negative neurological ROS  negative psych ROS   GI/Hepatic Neg liver ROS, GERD-  Medicated and Controlled,  Endo/Other  diabetes, Type 2, Oral Hypoglycemic Agents  Renal/GU negative Renal ROS  negative genitourinary   Musculoskeletal   Abdominal   Peds  Hematology negative hematology ROS (+)   Anesthesia Other Findings   Reproductive/Obstetrics negative OB ROS                           Anesthesia Physical Anesthesia Plan  ASA: II  Anesthesia Plan: General   Post-op Pain Management:    Induction: Intravenous  Airway Management Planned: LMA  Additional Equipment:   Intra-op Plan:   Post-operative Plan: Extubation in OR  Informed Consent: I have reviewed the patients History and Physical, chart, labs and discussed the procedure including the risks, benefits and alternatives for the proposed anesthesia with the patient or authorized representative who has indicated his/her understanding and acceptance.   Dental advisory given  Plan Discussed with: CRNA  Anesthesia Plan Comments:         Anesthesia Quick Evaluation

## 2013-01-19 NOTE — H&P (Signed)
PREOPERATIVE H&P  Chief Complaint: CHONDROMALCIA PATELLA, DERANGEMENT MEDIAL MENISCUS, POST HORN, LEFT KNEE  HPI: Ian Johnson is a 57 y.o. male who presents for preoperative history and physical with a diagnosis of CHONDROMALCIA PATELLA, DERANGEMENT MEDIAL MENISCUS, POST HORN, LEFT KNEE. Symptoms are rated as moderate to severe, and have been worsening.  This is significantly impairing activities of daily living.  He has elected for surgical management. He has failed injections, anti-inflammatories, physical therapy, exercises, and activity modification.  Past Medical History  Diagnosis Date  . Personal history of urinary calculi   . Nonspecific elevation of levels of transaminase or lactic acid dehydrogenase (LDH)   . Backache, unspecified   . Other and unspecified hyperlipidemia   . Esophageal reflux   . Diabetes mellitus without mention of complication   . Unspecified essential hypertension     no meds now  . Peripheral neuropathy   . Arthritis   . 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 Wears glasses    Past Surgical History  Procedure Laterality Date  . Lumbar disc surgery      l4 and l5  . Eye surgery  [phone]    multiple lt eye reconstruction surg -glass eye  . Colonoscopy    . Upper gi endoscopy     History   Social History  . Marital Status: Married    Spouse Name: N/A    Number of Children: 2  . Years of Education: 12   Occupational History  . Archivist    Social History Main Topics  . Smoking status: Never Smoker   . Smokeless tobacco: Never Used  . Alcohol Use: No  . Drug Use: No  . Sexually Active: Yes -- Male partner(s)   Other Topics Concern  . None   Social History Narrative   HSG. married 7 years - divorced; remarried /93. 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   Family History  Problem Relation Age of Onset  . Coronary artery disease Mother   . Coronary artery disease Father   . Peripheral  vascular disease Father    Allergies  Allergen Reactions  . Codeine     REACTION: Nausea   Prior to Admission medications   Medication Sig Start Date End Date Taking? Authorizing Provider  Ascorbic Acid (VITAMIN C) 500 MG CAPS Take 1 each by mouth daily.   Yes Historical Provider, MD  aspirin 81 MG EC tablet Take 81 mg by mouth daily.     Yes Historical Provider, MD  Coenzyme Q10 (CO Q-10) 100 MG CAPS Take 200 mg by mouth daily.     Yes Historical Provider, MD  etodolac (LODINE) 500 MG tablet Take 1 tablet (500 mg total) by mouth 2 (two) times daily. 06/19/12  Yes Jacques Navy, MD  fish oil-omega-3 fatty acids 1000 MG capsule Take 2 g by mouth daily. 1200 mg daily.   Yes Historical Provider, MD  gabapentin (NEURONTIN) 300 MG capsule Take three times a day 10/09/12 10/09/13 Yes Jacques Navy, MD  Ginkgo Biloba 60 MG CAPS Take 1 each by mouth daily.   Yes Historical Provider, MD  glimepiride (AMARYL) 2 MG tablet Take 1 tablet (2 mg total) by mouth daily. 06/19/12  Yes Jacques Navy, MD  metFORMIN (GLUCOPHAGE) 500 MG tablet Take 1 tablet (500 mg total) by mouth 2 (two) times daily with a meal. 06/19/12 06/19/13 Yes Jacques Navy, MD  omeprazole (  PRILOSEC) 20 MG capsule Take 1 capsule (20 mg total) by mouth 2 (two) times daily. 06/19/12  Yes Jacques Navy, MD  pioglitazone (ACTOS) 30 MG tablet Take 1 tablet (30 mg total) by mouth daily. 06/19/12  Yes Jacques Navy, MD  rosuvastatin (CRESTOR) 10 MG tablet Take 1 tablet (10 mg total) by mouth daily. 06/19/12 06/19/13 Yes Jacques Navy, MD  gabapentin (NEURONTIN) 100 MG tablet Take 1 tablet (100 mg total) by mouth 3 (three) times daily. Leg Cramps 06/26/11   Jacques Navy, MD  Multiple Vitamin (MULTIVITAMIN) tablet Take 1 tablet by mouth daily.      Historical Provider, MD     Positive ROS: All other systems have been reviewed and were otherwise negative with the exception of those mentioned in the HPI and as above.  Physical  Exam: General: Alert, no acute distress Cardiovascular: No pedal edema Respiratory: No cyanosis, no use of accessory musculature GI: No organomegaly, abdomen is soft and non-tender Skin: No lesions in the area of chief complaint Neurologic: Sensation intact distally Psychiatric: Patient is competent for consent with normal mood and affect Lymphatic: No axillary or cervical lymphadenopathy  MUSCULOSKELETAL: Positive medial joint line pain, left knee.   Assessment: CHONDROMALCIA PATELLA, DERANGEMENT MEDIAL MENISCUS, POST HORN, LEFT KNEE  Plan: Plan for Procedure(s): KNEE ARTHROSCOPY WITH MEDIAL MENISECTOMY CHONDROPLASTY  The risks benefits and alternatives were discussed with the patient including but not limited to the risks of nonoperative treatment, versus surgical intervention including infection, bleeding, nerve injury,  blood clots, cardiopulmonary complications, morbidity, mortality, among others, and they were willing to proceed.   Rheya Minogue P, MD Cell [phone] Pager [phone]  01/19/2013 8:05 AM

## 2013-01-19 NOTE — Op Note (Signed)
01/19/2013  9:17 AM  PATIENT:  Ian Johnson    PRE-OPERATIVE DIAGNOSIS:  DERANGEMENT MEDIAL MENISCUS, POST HORN, LEFT KNEE  POST-OPERATIVE DIAGNOSIS:  Same  PROCEDURE:  KNEE ARTHROSCOPY WITH MEDIAL MENISECTOMY,   SURGEON:  Eulas Post, MD  PHYSICIAN ASSISTANT: Janace Litten, OPA-C, present and scrubbed throughout the case, critical for completion in a timely fashion, and for retraction, instrumentation, and closure.  ANESTHESIA:   General  PREOPERATIVE INDICATIONS:  [name] is a  57 y.o. male with a diagnosis of DERANGEMENT MEDIAL MENISCUS, POST HORN, LEFT KNEE who failed conservative measures and elected for surgical management.    The risks benefits and alternatives were discussed with the patient preoperatively including but not limited to the risks of infection, bleeding, nerve injury, cardiopulmonary complications, the need for revision surgery, among others, and the patient was willing to proceed.  OPERATIVE IMPLANTS: None  OPERATIVE FINDINGS: The patellofemoral joint had some grade 1 changes, the lateral side was essentially pristine. The lateral meniscus was intact. Anterior cruciate ligament and PCL were intact. The medial meniscus had a complex posterior tear. The articular cartilage had some grade 2 changes. There was no exposed bone.  OPERATIVE PROCEDURE: The patient is brought to the operating room and placed in the supine position. General anesthesia was administered. The left lower extremity was prepped and draped in usual sterile fashion. Time out was performed.  2 inferomedial portals were created, and diagnostic arthroscopy carried out the above-named findings. The arthroscopic shaver and the arthroscopic biter was used to debride the medial meniscus back to a stable configuration. There was a complex component to the tear, as well as a cleavage tear which extended back to the capsule. The superior inferior leaflet were debrided back to stable  configuration. I also switched portals to debride the anterior component of the tear.  Once a stable configuration was achieved, I irrigated the knee copiously, and remove the arthroscopic instruments and injected the knee and repaired the portals with Monocryl followed by Steri-Strips and sterile gauze. He was awakened and returned to the PACU in stable and satisfactory condition. There were no complications.

## 2013-01-19 NOTE — Anesthesia Postprocedure Evaluation (Signed)
  Anesthesia Post-op Note  Patient: Ian Johnson  Procedure(s) Performed: Procedure(s) with comments: KNEE ARTHROSCOPY WITH MEDIAL MENISECTOMY (Left) - LEFT KNEE SCOPE WITH DEBRIDEMENT AND partial MEDIAL MENISECTOMY CHONDROPLASTY (Left) - ARTHROSCOPY KNEE WITH DEBRIDEMENT/SHAVING (CHONDROPLASTY)  Patient Location: PACU  Anesthesia Type:General  Level of Consciousness: awake and alert   Airway and Oxygen Therapy: Patient Spontanous Breathing  Post-op Pain: none  Post-op Assessment: Post-op Vital signs reviewed, Patient's Cardiovascular Status Stable, Respiratory Function Stable, Patent Airway and No signs of Nausea or vomiting  Post-op Vital Signs: Reviewed and stable  Complications: No apparent anesthesia complications

## 2013-01-22 ENCOUNTER — Encounter (HOSPITAL_BASED_OUTPATIENT_CLINIC_OR_DEPARTMENT_OTHER): Payer: Self-pay | Admitting: Orthopedic Surgery

## 2013-01-22 NOTE — Addendum Note (Signed)
Addendum created 01/22/13 1232 by Jewel Baize Gelene Recktenwald, CRNA   Modules edited: Anesthesia Responsible Staff, Charges VN

## 2013-02-06 ENCOUNTER — Other Ambulatory Visit (INDEPENDENT_AMBULATORY_CARE_PROVIDER_SITE_OTHER): Payer: Commercial Managed Care - PPO

## 2013-02-06 DIAGNOSIS — E785 Hyperlipidemia, unspecified: Secondary | ICD-10-CM

## 2013-02-06 DIAGNOSIS — E1149 Type 2 diabetes mellitus with other diabetic neurological complication: Secondary | ICD-10-CM

## 2013-02-06 LAB — LIPID PANEL
Cholesterol: 148 mg/dL (ref 0–200)
VLDL: 10.8 mg/dL (ref 0.0–40.0)

## 2013-02-06 LAB — COMPREHENSIVE METABOLIC PANEL
Albumin: 4.3 g/dL (ref 3.5–5.2)
Alkaline Phosphatase: 40 U/L (ref 39–117)
BUN: 16 mg/dL (ref 6–23)
CO2: 30 mEq/L (ref 19–32)
Calcium: 9.8 mg/dL (ref 8.4–10.5)
Chloride: 101 mEq/L (ref 96–112)
GFR: 95.63 mL/min (ref >60.00)
Glucose, Bld: 150 mg/dL — ABNORMAL HIGH (ref 70–99)
Potassium: 4.8 mEq/L (ref 3.5–5.1)

## 2013-02-06 LAB — HEPATIC FUNCTION PANEL
Bilirubin, Direct: 0.2 mg/dL (ref 0.0–0.3)
Total Bilirubin: 1.5 mg/dL — ABNORMAL HIGH (ref 0.3–1.2)

## 2013-02-06 LAB — HEMOGLOBIN A1C: Hgb A1c MFr Bld: 7.2 % — ABNORMAL HIGH (ref 4.6–6.5)

## 2013-02-19 ENCOUNTER — Ambulatory Visit (INDEPENDENT_AMBULATORY_CARE_PROVIDER_SITE_OTHER): Payer: Commercial Managed Care - PPO | Admitting: Internal Medicine

## 2013-02-19 ENCOUNTER — Other Ambulatory Visit: Payer: Self-pay

## 2013-02-19 ENCOUNTER — Encounter: Payer: Self-pay | Admitting: Internal Medicine

## 2013-02-19 VITALS — BP 140/70 | HR 64 | Temp 98.3°F | Resp 12 | Wt 191.8 lb

## 2013-02-19 DIAGNOSIS — E1149 Type 2 diabetes mellitus with other diabetic neurological complication: Secondary | ICD-10-CM

## 2013-02-19 DIAGNOSIS — I10 Essential (primary) hypertension: Secondary | ICD-10-CM

## 2013-02-19 DIAGNOSIS — Z5189 Encounter for other specified aftercare: Secondary | ICD-10-CM

## 2013-02-19 DIAGNOSIS — E119 Type 2 diabetes mellitus without complications: Secondary | ICD-10-CM

## 2013-02-19 DIAGNOSIS — S83232D Complex tear of medial meniscus, current injury, left knee, subsequent encounter: Secondary | ICD-10-CM

## 2013-02-19 DIAGNOSIS — E785 Hyperlipidemia, unspecified: Secondary | ICD-10-CM

## 2013-02-19 DIAGNOSIS — M79609 Pain in unspecified limb: Secondary | ICD-10-CM

## 2013-02-19 MED ORDER — GLIMEPIRIDE 2 MG PO TABS: 2.0000 mg | ORAL_TABLET | Freq: Every day | ORAL | Status: DC

## 2013-02-19 MED ORDER — ETODOLAC 500 MG PO TABS: 500.0000 mg | ORAL_TABLET | Freq: Two times a day (BID) | ORAL | Status: DC

## 2013-02-19 MED ORDER — METFORMIN HCL 500 MG PO TABS: 500.0000 mg | ORAL_TABLET | Freq: Two times a day (BID) | ORAL | Status: DC

## 2013-02-19 MED ORDER — GABAPENTIN 300 MG PO CAPS: ORAL_CAPSULE | ORAL | Status: DC

## 2013-02-19 MED ORDER — ROSUVASTATIN CALCIUM 10 MG PO TABS: 10.0000 mg | ORAL_TABLET | Freq: Every day | ORAL | Status: DC

## 2013-02-19 MED ORDER — PIOGLITAZONE HCL 30 MG PO TABS: 30.0000 mg | ORAL_TABLET | Freq: Every day | ORAL | Status: DC

## 2013-02-19 MED ORDER — OMEPRAZOLE 20 MG PO CPDR: 20.0000 mg | DELAYED_RELEASE_CAPSULE | Freq: Two times a day (BID) | ORAL | Status: DC

## 2013-02-19 NOTE — Assessment & Plan Note (Signed)
BP Readings from Last 3 Encounters:  02/19/13 140/70  01/19/13 162/72  01/19/13 162/72   Good control today.  Plan Continue present medications

## 2013-02-19 NOTE — Assessment & Plan Note (Signed)
Continued severe distal peripheral neuropathy - diabetic related.

## 2013-02-19 NOTE — Assessment & Plan Note (Signed)
Lab Results  Component Value Date   HGBA1C 7.2* 02/06/2013   Good enough control. No change in medication. Expect improvement when he can be more mobile

## 2013-02-19 NOTE — Progress Notes (Signed)
Subjective:    Patient ID: Ian Johnson, male    DOB: 1956-06-20, 57 y.o.   MRN: [id number]  HPI Mr. Lautner presents for follow up of diabetes with complications, hyperlipidemia and mild elevation in liver functions.   In the interval he has had arthroscopic surgery left knee for repair of torn meniscus.  He continues to have profound peripheral neuropathy with pain secondary to diabetes. He has continue back pain. He remains disabled from doing his usual work or any manual work that requires him to be on his feet.   Past Medical History  Diagnosis Date  . Personal history of urinary calculi   . Nonspecific elevation of levels of transaminase or lactic acid dehydrogenase (LDH)   . Backache, unspecified   . Other and unspecified hyperlipidemia   . Esophageal reflux   . Diabetes mellitus without mention of complication   . Unspecified essential hypertension     no meds now  . Peripheral neuropathy   . Arthritis   . 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 Wears glasses   . Complex tear of medial meniscus of left knee as current injury 01/19/2013   Past Surgical History  Procedure Laterality Date  . Lumbar disc surgery      l4 and l5  . Eye surgery  [phone]    multiple lt eye reconstruction surg -glass eye  . Colonoscopy    . Upper gi endoscopy    . Knee arthroscopy with medial menisectomy Left 01/19/2013    Procedure: KNEE ARTHROSCOPY WITH MEDIAL MENISECTOMY;  Surgeon: Eulas Post, MD;  Location: [facility];  Service: Orthopedics;  Laterality: Left;  LEFT KNEE SCOPE WITH DEBRIDEMENT AND partial MEDIAL MENISECTOMY  . Chondroplasty Left 01/19/2013    Procedure: CHONDROPLASTY;  Surgeon: Eulas Post, MD;  Location: [facility];  Service: Orthopedics;  Laterality: Left;  ARTHROSCOPY KNEE WITH DEBRIDEMENT/SHAVING (CHONDROPLASTY)   Family History  Problem Relation Age of Onset  . Coronary artery disease Mother   . Coronary artery disease  Father   . Peripheral vascular disease Father    History   Social History  . Marital Status: Married    Spouse Name: N/A    Number of Children: 2  . Years of Education: 12   Occupational History  . Archivist    Social History Main Topics  . Smoking status: Never Smoker   . Smokeless tobacco: Never Used  . Alcohol Use: No  . Drug Use: No  . Sexually Active: Yes -- Male partner(s)   Other Topics Concern  . Not on file   Social History Narrative   HSG. married 7 years - divorced; remarried /93. 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    Current Outpatient Prescriptions on File Prior to Visit  Medication Sig Dispense Refill  . Ascorbic Acid (VITAMIN C) 500 MG CAPS Take 1 each by mouth daily.      Marland Kitchen aspirin 81 MG EC tablet Take 81 mg by mouth daily.        . Coenzyme Q10 (CO Q-10) 100 MG CAPS Take 200 mg by mouth daily.        . fish oil-omega-3 fatty acids 1000 MG capsule Take 2 g by mouth daily. 1200 mg daily.      Marland Kitchen gabapentin (NEURONTIN) 100 MG tablet Take 1 tablet (100 mg total) by mouth 3 (three) times daily. Leg Cramps  270  tablet  3  . Ginkgo Biloba 60 MG CAPS Take 1 each by mouth daily.      Marland Kitchen ketorolac (TORADOL) 10 MG tablet Take 1 tablet (10 mg total) by mouth every 6 (six) hours as needed for pain.  20 tablet  0  . Multiple Vitamin (MULTIVITAMIN) tablet Take 1 tablet by mouth daily.        Marland Kitchen oxyCODONE-acetaminophen (ROXICET) 5-325 MG per tablet Take 1-2 tablets by mouth every 6 (six) hours as needed for pain.  75 tablet  0  . promethazine (PHENERGAN) 25 MG tablet Take 1 tablet (25 mg total) by mouth every 6 (six) hours as needed for nausea.  30 tablet  0  . sennosides-docusate sodium (SENOKOT-S) 8.6-50 MG tablet Take 1 tablet by mouth daily.  30 tablet  1   No current facility-administered medications on file prior to visit.      Review of Systems System review is negative for any constitutional, cardiac, pulmonary, GI or neuro  symptoms or complaints other than as described in the HPI.     Objective:   Physical Exam Filed Vitals:   02/19/13 1103  BP: 140/70  Pulse: 64  Temp: 98.3 F (36.8 C)  Resp: 12   Wt Readings from Last 3 Encounters:  02/19/13 191 lb 12.8 oz (87 kg)  01/19/13 188 lb 3.2 oz (85.367 kg)  01/19/13 188 lb 3.2 oz (85.367 kg)   Gen'l- WNWD white man in no distress HEENT - prosthetic left eye.  Cor - RRR Pulm - normal respirations Neuro - grossly normal CN. No further exam conducted       Assessment & Plan:

## 2013-02-19 NOTE — Assessment & Plan Note (Signed)
LDL better than goal of 100 or less. No adverse effects from crestor  Plan Continue present medications.

## 2013-02-19 NOTE — Assessment & Plan Note (Signed)
Doing well - still with a bit of a limp.

## 2013-02-19 NOTE — Assessment & Plan Note (Signed)
No change in symptoms/condition - continue peripheral neuropathy which limits his activity and limits his ability to work.

## 2013-06-05 ENCOUNTER — Other Ambulatory Visit (INDEPENDENT_AMBULATORY_CARE_PROVIDER_SITE_OTHER): Payer: Commercial Managed Care - PPO

## 2013-06-05 DIAGNOSIS — E785 Hyperlipidemia, unspecified: Secondary | ICD-10-CM

## 2013-06-05 DIAGNOSIS — E1149 Type 2 diabetes mellitus with other diabetic neurological complication: Secondary | ICD-10-CM

## 2013-06-05 LAB — HEMOGLOBIN A1C: Hgb A1c MFr Bld: 7.2 % — ABNORMAL HIGH (ref 4.6–6.5)

## 2013-06-05 LAB — HEPATIC FUNCTION PANEL
ALT: 40 U/L (ref 0–53)
Alkaline Phosphatase: 50 U/L (ref 39–117)
Bilirubin, Direct: 0.2 mg/dL (ref 0.0–0.3)
Total Bilirubin: 1.1 mg/dL (ref 0.3–1.2)
Total Protein: 7.9 g/dL (ref 6.0–8.3)

## 2013-06-05 LAB — LIPID PANEL
Cholesterol: 147 mg/dL (ref 0–200)
HDL: 59.5 mg/dL (ref >39.00)
LDL Cholesterol: 72 mg/dL (ref 0–99)
Triglycerides: 76 mg/dL (ref 0.0–149.0)
VLDL: 15.2 mg/dL (ref 0.0–40.0)

## 2013-06-05 LAB — COMPREHENSIVE METABOLIC PANEL
AST: 29 U/L (ref 0–37)
Alkaline Phosphatase: 50 U/L (ref 39–117)
BUN: 16 mg/dL (ref 6–23)
Creatinine, Ser: 0.9 mg/dL (ref 0.4–1.5)
Glucose, Bld: 134 mg/dL — ABNORMAL HIGH (ref 70–99)

## 2013-06-10 ENCOUNTER — Encounter: Payer: Self-pay | Admitting: Internal Medicine

## 2013-06-26 ENCOUNTER — Ambulatory Visit (INDEPENDENT_AMBULATORY_CARE_PROVIDER_SITE_OTHER): Payer: Commercial Managed Care - PPO | Admitting: Internal Medicine

## 2013-06-26 ENCOUNTER — Encounter: Payer: Self-pay | Admitting: Internal Medicine

## 2013-06-26 VITALS — BP 160/78 | HR 63 | Temp 97.3°F | Resp 12 | Wt 186.4 lb

## 2013-06-26 DIAGNOSIS — E1149 Type 2 diabetes mellitus with other diabetic neurological complication: Secondary | ICD-10-CM

## 2013-06-26 DIAGNOSIS — E119 Type 2 diabetes mellitus without complications: Secondary | ICD-10-CM

## 2013-06-26 DIAGNOSIS — I10 Essential (primary) hypertension: Secondary | ICD-10-CM

## 2013-06-26 MED ORDER — BENZONATATE 100 MG PO CAPS: 100.0000 mg | ORAL_CAPSULE | Freq: Three times a day (TID) | ORAL | Status: DC | PRN

## 2013-06-26 MED ORDER — LOSARTAN POTASSIUM 50 MG PO TABS: 50.0000 mg | ORAL_TABLET | Freq: Every day | ORAL | Status: DC

## 2013-06-26 MED ORDER — SENNA-DOCUSATE SODIUM 8.6-50 MG PO TABS: 1.0000 | ORAL_TABLET | Freq: Every day | ORAL | Status: DC | PRN

## 2013-06-26 NOTE — Assessment & Plan Note (Signed)
Lab Results  Component Value Date   HGBA1C 7.2* 06/05/2013   He has been holding steady. No need to change medications  Plan Continue medical regimen  Continue to work on dietary adherence and regular exercise.

## 2013-06-26 NOTE — Assessment & Plan Note (Signed)
Disabling peripheral neuropathy - he cannot perform his previous work duties due to danger of severe injury, e.g. Fall from ladders, etc  Plan Completed disability paperwork.

## 2013-06-26 NOTE — Patient Instructions (Addendum)
Good to see you. I am glad that things are going pretty well, all things considered  Your lab was OK - no change in medication.  Dry cough - top of the list is a cyclical cough: a URI started the cough, which caused irritation of the trachea, which causes the cough, which perpetuates the irritation, etc, etc. Plan Benzonatate perle three (3) times a day for a week.  If the cough persists: increase prilosec to 40 mg ( 2 tablets) twice a day for 10 days.  If the cough persists will need to get a pulmonary consult.  Repeat labs in October with visit to follow  Insurance form done. We will mail it out today.

## 2013-06-26 NOTE — Assessment & Plan Note (Signed)
BP Readings from Last 3 Encounters:  06/26/13 160/78  02/19/13 140/70  01/19/13 162/72   BP edging up.   Plan  For BP control and renal protection of diabetic will start RAS product - losartan 50 mg once a day.

## 2013-06-26 NOTE — Progress Notes (Signed)
Subjective:    Patient ID: Ian Johnson, male    DOB: September 24, 1956, 57 y.o.   MRN: [id number]  HPI Ian Johnson has been doing pretty well. He has made a full recovery from knee surgery. He has been taking and tolerating all his medications. He is having right shoulder pain and is planning to have repair of torn rotator cuff by Dr. Dion Saucier this winter. He has had no major medical issues in the interval since his last visit.   PMH, FamHx and SocHx reviewed for any changes and relevance.  Current Outpatient Prescriptions on File Prior to Visit  Medication Sig Dispense Refill  . Ascorbic Acid (VITAMIN C) 500 MG CAPS Take 1 each by mouth daily.      Marland Kitchen aspirin 81 MG EC tablet Take 81 mg by mouth daily.        . Coenzyme Q10 (CO Q-10) 100 MG CAPS Take 200 mg by mouth daily.        Marland Kitchen etodolac (LODINE) 500 MG tablet Take 1 tablet (500 mg total) by mouth 2 (two) times daily.  180 tablet  3  . fish oil-omega-3 fatty acids 1000 MG capsule Take 2 g by mouth daily. 1200 mg daily.      Marland Kitchen gabapentin (NEURONTIN) 300 MG capsule Take three times a day  270 capsule  3  . Ginkgo Biloba 60 MG CAPS Take 1 each by mouth daily.      Marland Kitchen glimepiride (AMARYL) 2 MG tablet Take 1 tablet (2 mg total) by mouth daily.  90 tablet  3  . metFORMIN (GLUCOPHAGE) 500 MG tablet Take 1 tablet (500 mg total) by mouth 2 (two) times daily with a meal.  180 tablet  3  . Multiple Vitamin (MULTIVITAMIN) tablet Take 1 tablet by mouth daily.        Marland Kitchen omeprazole (PRILOSEC) 20 MG capsule Take 1 capsule (20 mg total) by mouth 2 (two) times daily.  180 capsule  3  . pioglitazone (ACTOS) 30 MG tablet Take 1 tablet (30 mg total) by mouth daily.  90 tablet  3  . rosuvastatin (CRESTOR) 10 MG tablet Take 1 tablet (10 mg total) by mouth daily.  90 tablet  3   No current facility-administered medications on file prior to visit.      Review of Systems System review is negative for any constitutional, cardiac, pulmonary, GI or neuro symptoms or  complaints other than as described in the HPI.     Objective:   Physical Exam Filed Vitals:   06/26/13 0941  BP: 160/78  Pulse: 63  Temp: 97.3 F (36.3 C)  Resp: 12   Wt Readings from Last 3 Encounters:  06/26/13 186 lb 6.4 oz (84.55 kg)  02/19/13 191 lb 12.8 oz (87 kg)  01/19/13 188 lb 3.2 oz (85.367 kg)   BP Readings from Last 3 Encounters:  06/26/13 160/78  02/19/13 140/70  01/19/13 162/72   Gen'l- WNWD white man in no distress HEENT - prosthetic OS, OD- PERRLA, C&S clear; oropharnx w/o lesions Neck - supple Cor 2+ radial , RRR Pulm - normal respirations Neuro - see foot exam. A&O x 3, Normal gait.       Assessment & Plan:  Dry cough - top of the list is a cyclical cough: a URI started the cough, which caused irritation of the trachea, which causes the cough, which perpetuates the irritation, etc, etc. Plan Benzonatate perle three (3) times a day for a week.  If the cough  persists: increase prilosec to 40 mg ( 2 tablets) twice a day for 10 days.  If the cough persists will need to get a pulmonary consult.

## 2013-06-27 ENCOUNTER — Telehealth: Payer: Self-pay

## 2013-06-27 NOTE — Telephone Encounter (Signed)
Patient notified prescription for his blood pressure has been forwarded to his pharmacy and advised to open a mychart acc

## 2013-06-27 NOTE — Telephone Encounter (Signed)
Message copied by Noreene Larsson on Wed Jun 27, 2013  8:15 AM ------      Message from: Jacques Navy      Created: Tue Jun 26, 2013  7:50 PM       Please call Mr. Matsuo: BP has been elevated last three visits. To control Blood pressure and to protect kidney function in a diabetic please start taking losartan 50 mg once a day - Rx sent to pharmacy.            Please open a MyChart account so that BP readings can be forwarded to [place].            Thanks Meghanne Pletz for your help. ------

## 2013-10-11 ENCOUNTER — Other Ambulatory Visit: Payer: Self-pay

## 2013-10-15 ENCOUNTER — Other Ambulatory Visit (INDEPENDENT_AMBULATORY_CARE_PROVIDER_SITE_OTHER): Payer: Commercial Managed Care - PPO

## 2013-10-15 DIAGNOSIS — E785 Hyperlipidemia, unspecified: Secondary | ICD-10-CM

## 2013-10-15 DIAGNOSIS — E1149 Type 2 diabetes mellitus with other diabetic neurological complication: Secondary | ICD-10-CM

## 2013-10-15 LAB — HEPATIC FUNCTION PANEL
ALT: 34 U/L (ref 0–53)
AST: 24 U/L (ref 0–37)
Albumin: 4.2 g/dL (ref 3.5–5.2)
Alkaline Phosphatase: 35 U/L — ABNORMAL LOW (ref 39–117)
Bilirubin, Direct: 0.1 mg/dL (ref 0.0–0.3)
Total Protein: 7.6 g/dL (ref 6.0–8.3)

## 2013-10-15 LAB — COMPREHENSIVE METABOLIC PANEL
ALT: 34 U/L (ref 0–53)
AST: 24 U/L (ref 0–37)
Alkaline Phosphatase: 35 U/L — ABNORMAL LOW (ref 39–117)
BUN: 17 mg/dL (ref 6–23)
Calcium: 9.3 mg/dL (ref 8.4–10.5)
Chloride: 102 mEq/L (ref 96–112)
Creatinine, Ser: 0.8 mg/dL (ref 0.4–1.5)
Potassium: 4.9 mEq/L (ref 3.5–5.1)

## 2013-10-15 LAB — LIPID PANEL
Cholesterol: 153 mg/dL (ref 0–200)
LDL Cholesterol: 78 mg/dL (ref 0–99)
Total CHOL/HDL Ratio: 3
Triglycerides: 74 mg/dL (ref 0.0–149.0)
VLDL: 14.8 mg/dL (ref 0.0–40.0)

## 2013-10-18 ENCOUNTER — Ambulatory Visit: Payer: Commercial Managed Care - PPO | Admitting: Internal Medicine

## 2013-10-22 ENCOUNTER — Ambulatory Visit: Payer: Commercial Managed Care - PPO | Admitting: Internal Medicine

## 2013-10-23 ENCOUNTER — Ambulatory Visit (INDEPENDENT_AMBULATORY_CARE_PROVIDER_SITE_OTHER): Payer: Commercial Managed Care - PPO | Admitting: Internal Medicine

## 2013-10-23 ENCOUNTER — Encounter: Payer: Self-pay | Admitting: Internal Medicine

## 2013-10-23 VITALS — BP 130/64 | HR 67 | Temp 96.8°F | Wt 193.8 lb

## 2013-10-23 DIAGNOSIS — I10 Essential (primary) hypertension: Secondary | ICD-10-CM

## 2013-10-23 DIAGNOSIS — R252 Cramp and spasm: Secondary | ICD-10-CM

## 2013-10-23 DIAGNOSIS — E119 Type 2 diabetes mellitus without complications: Secondary | ICD-10-CM

## 2013-10-23 DIAGNOSIS — Z23 Encounter for immunization: Secondary | ICD-10-CM

## 2013-10-23 DIAGNOSIS — E1149 Type 2 diabetes mellitus with other diabetic neurological complication: Secondary | ICD-10-CM

## 2013-10-23 DIAGNOSIS — E785 Hyperlipidemia, unspecified: Secondary | ICD-10-CM

## 2013-10-23 MED ORDER — LOSARTAN POTASSIUM 50 MG PO TABS: 50.0000 mg | ORAL_TABLET | Freq: Every day | ORAL | Status: DC

## 2013-10-23 NOTE — Progress Notes (Signed)
Pre visit review using our clinic review tool, if applicable. No additional management support is needed unless otherwise documented below in the visit note. 

## 2013-10-23 NOTE — Patient Instructions (Signed)
You are doing well. Your last labs look great.   For cramps - you can get herbal cinchona products.  For peripheral neuropathy continue the gabapentin.  Return in 3 weeks for a nurse visit to get Prevnar pneumonia vaccine.. In March you will get pneumovax vaccine. Check on coverage for Shingles vaccine.  Make an appointment for a full exam in March.

## 2013-10-23 NOTE — Progress Notes (Signed)
  Subjective:    Patient ID: Ian Johnson, male    DOB: 04/27/1956, 57 y.o.   MRN: [id number]  HPI [name] presents for routine follow up of diabetes, HTN, chronic cramps and severe peripheral neuropathy. In the interval since his last visit there have been no major changes in his condition. Due to the peripheral neuropathy he remains fully disabled.  PMH, FamHx and SocHx reviewed for any changes and relevance.  Current Outpatient Prescriptions on File Prior to Visit  Medication Sig Dispense Refill  . Ascorbic Acid (VITAMIN C) 500 MG CAPS Take 1 each by mouth daily.      Marland Kitchen aspirin 81 MG EC tablet Take 81 mg by mouth daily.        . Coenzyme Q10 (CO Q-10) 100 MG CAPS Take 200 mg by mouth daily.        Marland Kitchen etodolac (LODINE) 500 MG tablet Take 1 tablet (500 mg total) by mouth 2 (two) times daily.  180 tablet  3  . fish oil-omega-3 fatty acids 1000 MG capsule Take 2 g by mouth daily. 1200 mg daily.      Marland Kitchen gabapentin (NEURONTIN) 300 MG capsule Take three times a day  270 capsule  3  . Ginkgo Biloba 60 MG CAPS Take 1 each by mouth daily.      Marland Kitchen glimepiride (AMARYL) 2 MG tablet Take 1 tablet (2 mg total) by mouth daily.  90 tablet  3  . metFORMIN (GLUCOPHAGE) 500 MG tablet Take 1 tablet (500 mg total) by mouth 2 (two) times daily with a meal.  180 tablet  3  . Multiple Vitamin (MULTIVITAMIN) tablet Take 1 tablet by mouth daily.        Marland Kitchen omeprazole (PRILOSEC) 20 MG capsule Take 1 capsule (20 mg total) by mouth 2 (two) times daily.  180 capsule  3  . pioglitazone (ACTOS) 30 MG tablet Take 1 tablet (30 mg total) by mouth daily.  90 tablet  3  . rosuvastatin (CRESTOR) 10 MG tablet Take 1 tablet (10 mg total) by mouth daily.  90 tablet  3   No current facility-administered medications on file prior to visit.      Review of Systems Negative 12 point review of systems    Objective:   Physical Exam Filed Vitals:   10/23/13 1403  BP: 130/64  Pulse: 67  Temp: 96.8 F (36 C)   Wt Readings  from Last 3 Encounters:  10/23/13 193 lb 12.8 oz (87.907 kg)  06/26/13 186 lb 6.4 oz (84.55 kg)  02/19/13 191 lb 12.8 oz (87 kg)   Gen'l-  WNWD man in distress HEENT - OD -C&S normal; OS - new prosthesis (looks very natural). Cor- 2+ radial, RRR Pulm - normal respirations.       Assessment & Plan:

## 2013-10-24 NOTE — Assessment & Plan Note (Signed)
Lab Results  Component Value Date   HGBA1C 7.0* 10/15/2013   At goal and improved from last lab.  Plan Continue present regimen

## 2013-10-24 NOTE — Assessment & Plan Note (Signed)
Continued problem.  Plan Advised to research Cinchona products.

## 2013-10-24 NOTE — Assessment & Plan Note (Signed)
LDL 78 - better than goal; HDL 60 better than goal. LFTs normal.  Plan Continue present medication.

## 2013-10-24 NOTE — Assessment & Plan Note (Signed)
Continue pain and abnormal sensation. He is tolerating gabapentin w/o adverse side effects but does not want to increase dose.

## 2013-10-24 NOTE — Assessment & Plan Note (Signed)
BP Readings from Last 3 Encounters:  10/23/13 130/64  06/26/13 160/78  02/19/13 140/70   Adequate control on present medications.

## 2013-11-06 ENCOUNTER — Encounter (HOSPITAL_BASED_OUTPATIENT_CLINIC_OR_DEPARTMENT_OTHER): Payer: Self-pay | Admitting: *Deleted

## 2013-11-06 NOTE — Progress Notes (Signed)
Had labs dr Debby Bud 10/15/13 and ekg 2/14-no cardiac -diabetic-to bring meds .

## 2013-11-08 ENCOUNTER — Other Ambulatory Visit: Payer: Self-pay | Admitting: Orthopedic Surgery

## 2013-11-09 ENCOUNTER — Encounter (HOSPITAL_BASED_OUTPATIENT_CLINIC_OR_DEPARTMENT_OTHER): Payer: Self-pay | Admitting: Anesthesiology

## 2013-11-09 ENCOUNTER — Ambulatory Visit (HOSPITAL_BASED_OUTPATIENT_CLINIC_OR_DEPARTMENT_OTHER)
Admission: RE | Admit: 2013-11-09 | Discharge: 2013-11-09 | Disposition: A | Discharge status: Home / Self Care | Payer: Commercial Managed Care - PPO | Source: Ambulatory Visit | Attending: Orthopedic Surgery | Admitting: Orthopedic Surgery

## 2013-11-09 ENCOUNTER — Encounter (HOSPITAL_BASED_OUTPATIENT_CLINIC_OR_DEPARTMENT_OTHER)
Admission: RE | Disposition: A | Discharge status: Home / Self Care | Payer: Self-pay | Source: Ambulatory Visit | Attending: Orthopedic Surgery

## 2013-11-09 ENCOUNTER — Ambulatory Visit (HOSPITAL_BASED_OUTPATIENT_CLINIC_OR_DEPARTMENT_OTHER): Payer: Commercial Managed Care - PPO | Admitting: Anesthesiology

## 2013-11-09 ENCOUNTER — Encounter (HOSPITAL_BASED_OUTPATIENT_CLINIC_OR_DEPARTMENT_OTHER): Payer: Commercial Managed Care - PPO | Admitting: Anesthesiology

## 2013-11-09 DIAGNOSIS — E119 Type 2 diabetes mellitus without complications: Secondary | ICD-10-CM | POA: Insufficient documentation

## 2013-11-09 DIAGNOSIS — Z79899 Other long term (current) drug therapy: Secondary | ICD-10-CM | POA: Insufficient documentation

## 2013-11-09 DIAGNOSIS — E785 Hyperlipidemia, unspecified: Secondary | ICD-10-CM | POA: Insufficient documentation

## 2013-11-09 DIAGNOSIS — K219 Gastro-esophageal reflux disease without esophagitis: Secondary | ICD-10-CM | POA: Insufficient documentation

## 2013-11-09 DIAGNOSIS — H5440 Blindness, one eye, unspecified eye: Secondary | ICD-10-CM | POA: Insufficient documentation

## 2013-11-09 DIAGNOSIS — I10 Essential (primary) hypertension: Secondary | ICD-10-CM | POA: Insufficient documentation

## 2013-11-09 DIAGNOSIS — M7501 Adhesive capsulitis of right shoulder: Secondary | ICD-10-CM

## 2013-11-09 DIAGNOSIS — M129 Arthropathy, unspecified: Secondary | ICD-10-CM | POA: Insufficient documentation

## 2013-11-09 DIAGNOSIS — M249 Joint derangement, unspecified: Secondary | ICD-10-CM | POA: Insufficient documentation

## 2013-11-09 DIAGNOSIS — M67919 Unspecified disorder of synovium and tendon, unspecified shoulder: Secondary | ICD-10-CM | POA: Insufficient documentation

## 2013-11-09 DIAGNOSIS — Z7982 Long term (current) use of aspirin: Secondary | ICD-10-CM | POA: Insufficient documentation

## 2013-11-09 DIAGNOSIS — G609 Hereditary and idiopathic neuropathy, unspecified: Secondary | ICD-10-CM | POA: Insufficient documentation

## 2013-11-09 DIAGNOSIS — M7500 Adhesive capsulitis of unspecified shoulder: Secondary | ICD-10-CM | POA: Insufficient documentation

## 2013-11-09 DIAGNOSIS — M719 Bursopathy, unspecified: Secondary | ICD-10-CM | POA: Insufficient documentation

## 2013-11-09 HISTORY — DX: Adhesive capsulitis of right shoulder: M75.01

## 2013-11-09 HISTORY — PX: SHOULDER ARTHROSCOPY WITH ROTATOR CUFF REPAIR AND SUBACROMIAL DECOMPRESSION: SHX5686

## 2013-11-09 LAB — GLUCOSE, CAPILLARY: Glucose-Capillary: 83 mg/dL (ref 70–99)

## 2013-11-09 SURGERY — SHOULDER ARTHROSCOPY WITH ROTATOR CUFF REPAIR AND SUBACROMIAL DECOMPRESSION
Anesthesia: General | Site: Shoulder | Laterality: Right

## 2013-11-09 MED ORDER — PROMETHAZINE HCL 25 MG PO TABS: 25.0000 mg | ORAL_TABLET | Freq: Four times a day (QID) | ORAL | Status: DC | PRN

## 2013-11-09 MED ORDER — DEXAMETHASONE SODIUM PHOSPHATE 4 MG/ML IJ SOLN
INTRAMUSCULAR | Status: DC | PRN
  Administered 2013-11-09: 10 mg via INTRAVENOUS

## 2013-11-09 MED ORDER — MIDAZOLAM HCL 2 MG/2ML IJ SOLN
INTRAMUSCULAR | Status: AC
  Filled 2013-11-09: qty 2

## 2013-11-09 MED ORDER — OXYCODONE-ACETAMINOPHEN 10-325 MG PO TABS: 1.0000 | ORAL_TABLET | Freq: Four times a day (QID) | ORAL | Status: DC | PRN

## 2013-11-09 MED ORDER — FENTANYL CITRATE 0.05 MG/ML IJ SOLN
INTRAMUSCULAR | Status: AC
  Filled 2013-11-09: qty 2

## 2013-11-09 MED ORDER — PROPOFOL 10 MG/ML IV BOLUS
INTRAVENOUS | Status: DC | PRN
  Administered 2013-11-09: 180 mg via INTRAVENOUS

## 2013-11-09 MED ORDER — OXYCODONE HCL 5 MG/5ML PO SOLN: 5.0000 mg | Freq: Once | ORAL | Status: DC | PRN

## 2013-11-09 MED ORDER — CEFAZOLIN SODIUM 1-5 GM-% IV SOLN
INTRAVENOUS | Status: AC
  Filled 2013-11-09: qty 100

## 2013-11-09 MED ORDER — CEFAZOLIN SODIUM-DEXTROSE 2-3 GM-% IV SOLR
2.0000 g | INTRAVENOUS | Status: AC
  Administered 2013-11-09: 2 g via INTRAVENOUS

## 2013-11-09 MED ORDER — FENTANYL CITRATE 0.05 MG/ML IJ SOLN
50.0000 ug | INTRAMUSCULAR | Status: DC | PRN
  Administered 2013-11-09: 100 ug via INTRAVENOUS

## 2013-11-09 MED ORDER — LACTATED RINGERS IV SOLN
INTRAVENOUS | Status: DC
  Administered 2013-11-09: 08:00:00 via INTRAVENOUS

## 2013-11-09 MED ORDER — HYDROMORPHONE HCL PF 1 MG/ML IJ SOLN: 0.2500 mg | INTRAMUSCULAR | Status: DC | PRN

## 2013-11-09 MED ORDER — LIDOCAINE HCL (CARDIAC) 20 MG/ML IV SOLN
INTRAVENOUS | Status: DC | PRN
  Administered 2013-11-09: 60 mg via INTRAVENOUS

## 2013-11-09 MED ORDER — OXYCODONE HCL 5 MG PO TABS
5.0000 mg | ORAL_TABLET | Freq: Once | ORAL | Status: DC | PRN
Start: 2013-11-09 — End: 2013-11-09

## 2013-11-09 MED ORDER — FENTANYL CITRATE 0.05 MG/ML IJ SOLN
INTRAMUSCULAR | Status: DC | PRN
  Administered 2013-11-09 (×2): 25 ug via INTRAVENOUS

## 2013-11-09 MED ORDER — ONDANSETRON HCL 4 MG/2ML IJ SOLN: 4.0000 mg | Freq: Once | INTRAMUSCULAR | Status: DC | PRN

## 2013-11-09 MED ORDER — METHOCARBAMOL 500 MG PO TABS: 500.0000 mg | ORAL_TABLET | Freq: Four times a day (QID) | ORAL | Status: DC

## 2013-11-09 MED ORDER — ONDANSETRON HCL 4 MG/2ML IJ SOLN
INTRAMUSCULAR | Status: DC | PRN
  Administered 2013-11-09: 4 mg via INTRAVENOUS

## 2013-11-09 MED ORDER — SENNA-DOCUSATE SODIUM 8.6-50 MG PO TABS: 2.0000 | ORAL_TABLET | Freq: Every day | ORAL | Status: DC

## 2013-11-09 MED ORDER — MIDAZOLAM HCL 2 MG/2ML IJ SOLN
1.0000 mg | INTRAMUSCULAR | Status: DC | PRN
Start: 2013-11-09 — End: 2013-11-09
  Administered 2013-11-09: 2 mg via INTRAVENOUS

## 2013-11-09 MED ORDER — SODIUM CHLORIDE 0.9 % IR SOLN
Status: DC | PRN
  Administered 2013-11-09: 5000 mL

## 2013-11-09 MED ORDER — SUCCINYLCHOLINE CHLORIDE 20 MG/ML IJ SOLN
INTRAMUSCULAR | Status: DC | PRN
  Administered 2013-11-09: 100 mg via INTRAVENOUS

## 2013-11-09 MED ORDER — KETOROLAC TROMETHAMINE 10 MG PO TABS: 10.0000 mg | ORAL_TABLET | Freq: Four times a day (QID) | ORAL | Status: DC | PRN

## 2013-11-09 SURGICAL SUPPLY — 65 items
APL SKNCLS STERI-STRIP NONHPOA (GAUZE/BANDAGES/DRESSINGS) ×1
BENZOIN TINCTURE PRP APPL 2/3 (GAUZE/BANDAGES/DRESSINGS) ×2 IMPLANT
BLADE CUTTER GATOR 3.5 (BLADE) ×2 IMPLANT
BLADE GREAT WHITE 4.2 (BLADE) IMPLANT
BLADE SURG 15 STRL LF DISP TIS (BLADE) IMPLANT
BLADE SURG 15 STRL SS (BLADE)
BUR OVAL 4.0 (BURR) IMPLANT
BUR OVAL 6.0 (BURR) ×1 IMPLANT
CANISTER SUCT LVC 12 LTR MEDI- (MISCELLANEOUS) ×2 IMPLANT
CANNULA 5.75X71 LONG (CANNULA) ×2 IMPLANT
CANNULA TWIST IN 8.25X7CM (CANNULA) IMPLANT
DECANTER SPIKE VIAL GLASS SM (MISCELLANEOUS) IMPLANT
DRAPE INCISE IOBAN 66X45 STRL (DRAPES) ×2 IMPLANT
DRAPE SHOULDER BEACH CHAIR (DRAPES) ×2 IMPLANT
DRAPE U 20/CS (DRAPES) ×2 IMPLANT
DRAPE U-SHAPE 47X51 STRL (DRAPES) ×2 IMPLANT
DURAPREP 26ML APPLICATOR (WOUND CARE) ×2 IMPLANT
ELECT REM PT RETURN 9FT ADLT (ELECTROSURGICAL) ×2
ELECTRODE REM PT RTRN 9FT ADLT (ELECTROSURGICAL) ×1 IMPLANT
FIBERSTICK 2 (SUTURE) IMPLANT
GLOVE BIO SURGEON STRL SZ8 (GLOVE) ×2 IMPLANT
GLOVE BIOGEL PI IND STRL 7.0 (GLOVE) IMPLANT
GLOVE BIOGEL PI IND STRL 8 (GLOVE) ×2 IMPLANT
GLOVE BIOGEL PI INDICATOR 7.0 (GLOVE) ×1
GLOVE BIOGEL PI INDICATOR 8 (GLOVE) ×2
GLOVE ECLIPSE 6.5 STRL STRAW (GLOVE) ×1 IMPLANT
GLOVE EXAM NITRILE LRG STRL (GLOVE) ×1 IMPLANT
GLOVE ORTHO TXT STRL SZ7.5 (GLOVE) ×2 IMPLANT
GOWN BRE IMP PREV XXLGXLNG (GOWN DISPOSABLE) ×4 IMPLANT
GOWN PREVENTION PLUS XLARGE (GOWN DISPOSABLE) ×2 IMPLANT
IMMOBILIZER SHOULDER XLGE (ORTHOPEDIC SUPPLIES) IMPLANT
KIT SHOULDER TRACTION (DRAPES) ×2 IMPLANT
LASSO SUT 90 DEGREE (SUTURE) IMPLANT
NEEDLE SCORPION MULTI FIRE (NEEDLE) IMPLANT
PACK ARTHROSCOPY DSU (CUSTOM PROCEDURE TRAY) ×2 IMPLANT
PACK BASIN DAY SURGERY FS (CUSTOM PROCEDURE TRAY) ×2 IMPLANT
PAD ABD 8X10 STRL (GAUZE/BANDAGES/DRESSINGS) ×2 IMPLANT
SET ARTHROSCOPY TUBING (MISCELLANEOUS) ×2
SET ARTHROSCOPY TUBING LN (MISCELLANEOUS) ×1 IMPLANT
SHEET MEDIUM DRAPE 40X70 STRL (DRAPES) ×2 IMPLANT
SLEEVE SCD COMPRESS KNEE MED (MISCELLANEOUS) ×2 IMPLANT
SLING ARM FOAM STRAP LRG (SOFTGOODS) ×1 IMPLANT
SLING ARM FOAM STRAP MED (SOFTGOODS) IMPLANT
SLING ARM FOAM STRAP XLG (SOFTGOODS) IMPLANT
SLING ARM IMMOBILIZER LRG (SOFTGOODS) IMPLANT
SLING ARM IMMOBILIZER MED (SOFTGOODS) IMPLANT
SPONGE GAUZE 4X4 12PLY (GAUZE/BANDAGES/DRESSINGS) ×2 IMPLANT
STRIP CLOSURE SKIN 1/2X4 (GAUZE/BANDAGES/DRESSINGS) ×2 IMPLANT
SUT FIBERWIRE #2 38 T-5 BLUE (SUTURE)
SUT LASSO 45 DEGREE (SUTURE) IMPLANT
SUT LASSO 45 DEGREE LEFT (SUTURE) IMPLANT
SUT LASSO 45D RIGHT (SUTURE) IMPLANT
SUT MNCRL AB 4-0 PS2 18 (SUTURE) ×2 IMPLANT
SUT PDS AB 1 CT  36 (SUTURE)
SUT PDS AB 1 CT 36 (SUTURE) IMPLANT
SUT TIGER TAPE 7 IN WHITE (SUTURE) IMPLANT
SUT VIC AB 3-0 SH 27 (SUTURE)
SUT VIC AB 3-0 SH 27X BRD (SUTURE) IMPLANT
SUTURE FIBERWR #2 38 T-5 BLUE (SUTURE) IMPLANT
TAPE FIBER 2MM 7IN #2 BLUE (SUTURE) ×2 IMPLANT
TOWEL OR 17X24 6PK STRL BLUE (TOWEL DISPOSABLE) ×2 IMPLANT
TOWEL OR NON WOVEN STRL DISP B (DISPOSABLE) ×2 IMPLANT
TUBE CONNECTING 20X1/4 (TUBING) IMPLANT
WAND STAR VAC 90 (SURGICAL WAND) ×2 IMPLANT
WATER STERILE IRR 1000ML POUR (IV SOLUTION) ×2 IMPLANT

## 2013-11-09 NOTE — Progress Notes (Signed)
Assisted Dr. Joslin with right, ultrasound guided, interscalene  block. Side rails up, monitors on throughout procedure. See vital signs in flow sheet. Tolerated Procedure well. 

## 2013-11-09 NOTE — H&P (Signed)
Eulas Post, MD Physician Signed Orthopedics Consult Note Service date: 11/09/2013 7:33 AM    PREOPERATIVE H&P   Chief Complaint: RIGHT SHOULDER ARTICULAR CARTILAGE DISORDER, BURSAE TENDON DISORDER, partial RUPTURE ROTATOR CUFF   HPI: Ian Johnson is a 57 y.o. male who presents for preoperative history and physical with a diagnosis of RIGHT SHOULDER ARTICULAR CARTILAGE DISORDER, BURSAE TENDON DISORDER, RUPTURE ROTATOR CUFF. Symptoms are rated as moderate to severe, and have been worsening.  This is significantly impairing activities of daily living.  He has elected for surgical management. Failed injections, PT, modification of activities, time, over 1year of symptoms, nsaids.    Past Medical History   Diagnosis  Date   .  Personal history of urinary calculi     .  Nonspecific elevation of levels of transaminase or lactic acid dehydrogenase (LDH)     .  Backache, unspecified     .  Other and unspecified hyperlipidemia     .  Esophageal reflux     .  Diabetes mellitus without mention of complication     .  Unspecified essential hypertension         no meds now   .  Peripheral neuropathy     .  Arthritis     .  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  Wears glasses     .  Complex tear of medial meniscus of left knee as current injury  01/19/2013    Past Surgical History   Procedure  Laterality  Date   .  Lumbar disc surgery    1990       l4 and l5   .  Eye surgery    [phone]       multiple lt eye reconstruction surg -glass eye   .  Colonoscopy       .  Upper gi endoscopy       .  Knee arthroscopy with medial menisectomy  Left  01/19/2013       Procedure: KNEE ARTHROSCOPY WITH MEDIAL MENISECTOMY;  Surgeon: Eulas Post, MD;  Location: [facility];  Service: Orthopedics;  Laterality: Left;  LEFT KNEE SCOPE WITH DEBRIDEMENT AND partial MEDIAL MENISECTOMY   .  Chondroplasty  Left  01/19/2013       Procedure: CHONDROPLASTY;  Surgeon: Eulas Post,  MD;  Location: [facility];  Service: Orthopedics;  Laterality: Left;  ARTHROSCOPY KNEE WITH DEBRIDEMENT/SHAVING (CHONDROPLASTY)    History       Social History   .  Marital Status:  Married       Spouse Name:  N/A       Number of Children:  2   .  Years of Education:  12       Occupational History   .  Archivist         Social History Main Topics   .  Smoking status:  Never Smoker    .  Smokeless tobacco:  Never Used   .  Alcohol Use:  No   .  Drug Use:  No   .  Sexual Activity:  Yes       Partners:  Female       Other Topics  Concern   .  None       Social History Narrative     HSG. married 7 years - divorced; remarried /93. 1 daughter; 1 son -  PA at [employer]. Work - self-employed Archivist.    Family History   Problem  Relation  Age of Onset   .  Coronary artery disease  Mother     .  Coronary artery disease  Father     .  Peripheral vascular disease  Father      Allergies   Allergen  Reactions   .  Codeine         REACTION: Nausea    Prior to Admission medications    Medication  Sig  Start Date  End Date  Taking?  Authorizing Provider   Ascorbic Acid (VITAMIN C) 500 MG CAPS  Take 1 each by mouth daily.      Yes  Historical Provider, MD   aspirin 81 MG EC tablet  Take 81 mg by mouth daily.        Yes  Historical Provider, MD   Coenzyme Q10 (CO Q-10) 100 MG CAPS  Take 200 mg by mouth daily.        Yes  Historical Provider, MD   etodolac (LODINE) 500 MG tablet  Take 1 tablet (500 mg total) by mouth 2 (two) times daily.  02/19/13    Yes  Jacques Navy, MD   fish oil-omega-3 fatty acids 1000 MG capsule  Take 2 g by mouth daily. 1200 mg daily.      Yes  Historical Provider, MD   gabapentin (NEURONTIN) 300 MG capsule  300 mg 3 (three) times daily. Take three times a day  02/19/13  02/19/14  Yes  Jacques Navy, MD   Ginkgo Biloba 60 MG CAPS  Take 1 each by mouth daily.      Yes  Historical Provider, MD   glimepiride (AMARYL) 2 MG tablet  Take 1  tablet (2 mg total) by mouth daily.  02/19/13    Yes  Jacques Navy, MD   losartan (COZAAR) 50 MG tablet  Take 1 tablet (50 mg total) by mouth daily.  10/23/13    Yes  Jacques Navy, MD   metFORMIN (GLUCOPHAGE) 500 MG tablet  Take 1 tablet (500 mg total) by mouth 2 (two) times daily with a meal.  02/19/13  02/19/14  Yes  Jacques Navy, MD   Multiple Vitamin (MULTIVITAMIN) tablet  Take 1 tablet by mouth daily.        Yes  Historical Provider, MD   omeprazole (PRILOSEC) 20 MG capsule  Take 1 capsule (20 mg total) by mouth 2 (two) times daily.  02/19/13    Yes  Jacques Navy, MD   pioglitazone (ACTOS) 30 MG tablet  Take 1 tablet (30 mg total) by mouth daily.  02/19/13    Yes  Jacques Navy, MD   rosuvastatin (CRESTOR) 10 MG tablet  Take 10 mg by mouth every evening.  02/19/13  02/19/14  Yes  Jacques Navy, MD          Positive ROS: All other systems have been reviewed and were otherwise negative with the exception of those mentioned in the HPI and as above.   Physical Exam: General: Alert, no acute distress Cardiovascular: No pedal edema Respiratory: No cyanosis, no use of accessory musculature GI: No organomegaly, abdomen is soft and non-tender Skin: No lesions in the area of chief complaint Neurologic: Sensation intact distally Psychiatric: Patient is competent for consent with normal mood and affect Lymphatic: No axillary or cervical lymphadenopathy   MUSCULOSKELETAL: right shoulder positive impingement,  Mild weakness  with supraspinatus testing, 0-170 rom, ER 30   Assessment: RIGHT SHOULDER ARTICULAR CARTILAGE DISORDER, BURSAE TENDON DISORDER, partial RUPTURE ROTATOR CUFF   Plan: Plan for Procedure(s): RIGHT SHOULDER ARTHROSCOPY WITH ROTATOR CUFF REPAIR AND SUBACROMIAL DECOMPRESSION PARTAIL ACROMIOPLSTY WITH CORACOACROMIAL RELEASE, EXTENSIVE DEBRIDMENT    The risks benefits and alternatives were discussed with the patient including but not limited to the risks of  nonoperative treatment, versus surgical intervention including infection, bleeding, nerve injury,  blood clots, cardiopulmonary complications, morbidity, mortality, among others, and they were willing to proceed.    Eulas Post, MD Cell [phone]     11/09/2013 7:33 AM       Routing History...     Date/Time From To Method   11/09/2013 7:34 AM Eulas Post, MD Eulas Post, MD Fax   11/09/2013 7:34 AM Eulas Post, MD Jacques Navy, MD In [facility]

## 2013-11-09 NOTE — Anesthesia Postprocedure Evaluation (Signed)
  Anesthesia Post-op Note  Patient: Ian Johnson  Procedure(s) Performed: Procedure(s): RIGHT SHOULDER ARTHROSCOPY WITH DEBRIDEMENT OF PARTIAL ROTATOR CUFF TEAR AND SUBACROMIAL DECOMPRESSION  (Right)  Patient Location: PACU  Anesthesia Type:General and GA combined with regional for post-op pain  Level of Consciousness: awake, alert  and oriented  Airway and Oxygen Therapy: Patient Spontanous Breathing  Post-op Pain: none  Post-op Assessment: Post-op Vital signs reviewed, Patient's Cardiovascular Status Stable, Respiratory Function Stable, Patent Airway and Pain level controlled  Post-op Vital Signs: stable  Complications: No apparent anesthesia complications

## 2013-11-09 NOTE — Anesthesia Preprocedure Evaluation (Signed)
Anesthesia Evaluation  Patient identified by MRN, date of birth, ID band Patient awake and Patient confused    Reviewed: Allergy & Precautions, H&P , NPO status , Patient's Chart, lab work & pertinent test results  Airway Mallampati: II TM Distance: >3 FB     Dental  (+) Teeth Intact and Dental Advisory Given   Pulmonary          Cardiovascular hypertension, Rhythm:Regular Rate:Normal     Neuro/Psych    GI/Hepatic   Endo/Other    Renal/GU      Musculoskeletal   Abdominal   Peds  Hematology   Anesthesia Other Findings   Reproductive/Obstetrics                           Anesthesia Physical Anesthesia Plan  ASA: II  Anesthesia Plan: General/Spinal   Post-op Pain Management:    Induction:   Airway Management Planned: Oral ETT  Additional Equipment:   Intra-op Plan:   Post-operative Plan: Extubation in OR  Informed Consent: I have reviewed the patients History and Physical, chart, labs and discussed the procedure including the risks, benefits and alternatives for the proposed anesthesia with the patient or authorized representative who has indicated his/her understanding and acceptance.   Dental advisory given  Plan Discussed with: Anesthesiologist  Anesthesia Plan Comments:         Anesthesia Quick Evaluation

## 2013-11-09 NOTE — Consult Note (Signed)
PREOPERATIVE H&P  Chief Complaint: RIGHT SHOULDER ARTICULAR CARTILAGE DISORDER, BURSAE TENDON DISORDER, partial RUPTURE ROTATOR CUFF  HPI: Ian Johnson is a 57 y.o. male who presents for preoperative history and physical with a diagnosis of RIGHT SHOULDER ARTICULAR CARTILAGE DISORDER, BURSAE TENDON DISORDER, RUPTURE ROTATOR CUFF. Symptoms are rated as moderate to severe, and have been worsening.  This is significantly impairing activities of daily living.  He has elected for surgical management. Failed injections, PT, modification of activities, time, over 1year of symptoms, nsaids.  Past Medical History  Diagnosis Date  . Personal history of urinary calculi   . Nonspecific elevation of levels of transaminase or lactic acid dehydrogenase (LDH)   . Backache, unspecified   . Other and unspecified hyperlipidemia   . Esophageal reflux   . Diabetes mellitus without mention of complication   . Unspecified essential hypertension     no meds now  . Peripheral neuropathy   . Arthritis   . 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 Wears glasses   . Complex tear of medial meniscus of left knee as current injury 01/19/2013   Past Surgical History  Procedure Laterality Date  . Lumbar disc surgery  1990    l4 and l5  . Eye surgery  [phone]    multiple lt eye reconstruction surg -glass eye  . Colonoscopy    . Upper gi endoscopy    . Knee arthroscopy with medial menisectomy Left 01/19/2013    Procedure: KNEE ARTHROSCOPY WITH MEDIAL MENISECTOMY;  Surgeon: Eulas Post, MD;  Location: [facility];  Service: Orthopedics;  Laterality: Left;  LEFT KNEE SCOPE WITH DEBRIDEMENT AND partial MEDIAL MENISECTOMY  . Chondroplasty Left 01/19/2013    Procedure: CHONDROPLASTY;  Surgeon: Eulas Post, MD;  Location: [facility];  Service: Orthopedics;  Laterality: Left;  ARTHROSCOPY KNEE WITH DEBRIDEMENT/SHAVING (CHONDROPLASTY)   History   Social History  . Marital  Status: Married    Spouse Name: N/A    Number of Children: 2  . Years of Education: 12   Occupational History  . Archivist    Social History Main Topics  . Smoking status: Never Smoker   . Smokeless tobacco: Never Used  . Alcohol Use: No  . Drug Use: No  . Sexual Activity: Yes    Partners: Female   Other Topics Concern  . None   Social History Narrative   HSG. married 7 years - divorced; remarried /93. 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   Family History  Problem Relation Age of Onset  . Coronary artery disease Mother   . Coronary artery disease Father   . Peripheral vascular disease Father    Allergies  Allergen Reactions  . Codeine     REACTION: Nausea   Prior to Admission medications   Medication Sig Start Date End Date Taking? Authorizing Provider  Ascorbic Acid (VITAMIN C) 500 MG CAPS Take 1 each by mouth daily.   Yes Historical Provider, MD  aspirin 81 MG EC tablet Take 81 mg by mouth daily.     Yes Historical Provider, MD  Coenzyme Q10 (CO Q-10) 100 MG CAPS Take 200 mg by mouth daily.     Yes Historical Provider, MD  etodolac (LODINE) 500 MG tablet Take 1 tablet (500 mg total) by mouth 2 (two) times daily. 02/19/13  Yes Jacques Navy, MD  fish oil-omega-3 fatty acids 1000 MG capsule Take  2 g by mouth daily. 1200 mg daily.   Yes Historical Provider, MD  gabapentin (NEURONTIN) 300 MG capsule 300 mg 3 (three) times daily. Take three times a day 02/19/13 02/19/14 Yes Jacques Navy, MD  Ginkgo Biloba 60 MG CAPS Take 1 each by mouth daily.   Yes Historical Provider, MD  glimepiride (AMARYL) 2 MG tablet Take 1 tablet (2 mg total) by mouth daily. 02/19/13  Yes Jacques Navy, MD  losartan (COZAAR) 50 MG tablet Take 1 tablet (50 mg total) by mouth daily. 10/23/13  Yes Jacques Navy, MD  metFORMIN (GLUCOPHAGE) 500 MG tablet Take 1 tablet (500 mg total) by mouth 2 (two) times daily with a meal. 02/19/13 02/19/14 Yes Jacques Navy, MD   Multiple Vitamin (MULTIVITAMIN) tablet Take 1 tablet by mouth daily.     Yes Historical Provider, MD  omeprazole (PRILOSEC) 20 MG capsule Take 1 capsule (20 mg total) by mouth 2 (two) times daily. 02/19/13  Yes Jacques Navy, MD  pioglitazone (ACTOS) 30 MG tablet Take 1 tablet (30 mg total) by mouth daily. 02/19/13  Yes Jacques Navy, MD  rosuvastatin (CRESTOR) 10 MG tablet Take 10 mg by mouth every evening. 02/19/13 02/19/14 Yes Jacques Navy, MD     Positive ROS: All other systems have been reviewed and were otherwise negative with the exception of those mentioned in the HPI and as above.  Physical Exam: General: Alert, no acute distress Cardiovascular: No pedal edema Respiratory: No cyanosis, no use of accessory musculature GI: No organomegaly, abdomen is soft and non-tender Skin: No lesions in the area of chief complaint Neurologic: Sensation intact distally Psychiatric: Patient is competent for consent with normal mood and affect Lymphatic: No axillary or cervical lymphadenopathy  MUSCULOSKELETAL: right shoulder positive impingement,  Mild weakness with supraspinatus testing, 0-170 rom, ER 30  Assessment: RIGHT SHOULDER ARTICULAR CARTILAGE DISORDER, BURSAE TENDON DISORDER, partial RUPTURE ROTATOR CUFF  Plan: Plan for Procedure(s): RIGHT SHOULDER ARTHROSCOPY WITH ROTATOR CUFF REPAIR AND SUBACROMIAL DECOMPRESSION PARTAIL ACROMIOPLSTY WITH CORACOACROMIAL RELEASE, EXTENSIVE DEBRIDMENT   The risks benefits and alternatives were discussed with the patient including but not limited to the risks of nonoperative treatment, versus surgical intervention including infection, bleeding, nerve injury,  blood clots, cardiopulmonary complications, morbidity, mortality, among others, and they were willing to proceed.   Eulas Post, MD Cell [phone]   11/09/2013 7:33 AM

## 2013-11-09 NOTE — Transfer of Care (Signed)
Immediate Anesthesia Transfer of Care Note  Patient: Ian Johnson  Procedure(s) Performed: Procedure(s): RIGHT SHOULDER ARTHROSCOPY WITH DEBRIDEMENT OF PARTIAL ROTATOR CUFF TEAR AND SUBACROMIAL DECOMPRESSION  (Right)  Patient Location: PACU  Anesthesia Type:General  Level of Consciousness: awake, alert  and oriented  Airway & Oxygen Therapy: Patient Spontanous Breathing and Patient connected to face mask oxygen  Post-op Assessment: Report given to PACU RN and Post -op Vital signs reviewed and stable  Post vital signs: Reviewed  Complications: No apparent anesthesia complications

## 2013-11-09 NOTE — Op Note (Signed)
11/09/2013  9:06 AM  PATIENT:  Ian Johnson    PRE-OPERATIVE DIAGNOSIS:  RIGHT SHOULDER BURSAE TENDON DISORDER, RUPTURE ROTATOR CUFF  POST-OPERATIVE DIAGNOSIS:  Right shoulder he's capsulitis with subacromial bursitis and impingement syndrome and partial thickness bursal sided rotator cuff tear  PROCEDURE:  Right shoulder manipulation under anesthesia, arthroscopy, limited debridement of the bursal sided rotator cuff tear, acromioplasty, CA ligament release, and subacromial bursectomy  SURGEON:  Eulas Post, MD  PHYSICIAN ASSISTANT: Janace Litten, OPA-C, present and scrubbed throughout the case, critical for completion in a timely fashion, and for retraction, instrumentation, and closure.  ANESTHESIA:   General  PREOPERATIVE INDICATIONS:  Ian Johnson is a  57 y.o. male who had persistent right shoulder pain, an MRI that demonstrated partial thickness rotator cuff tear, and ongoing stiffness and pain, failing injections, activity modification, exercises, among others.  The risks benefits and alternatives were discussed with the patient preoperatively including but not limited to the risks of infection, bleeding, nerve injury, cardiopulmonary complications, the need for revision surgery, among others, and the patient was willing to proceed. We also discussed the risk for progression of rotator cuff tear, persistent pain, stiffness, loss of function.  OPERATIVE IMPLANTS: None  OPERATIVE FINDINGS: The shoulder had significant stiffness during examination under anesthesia, and only went 0 250 at most. Manipulation under anesthesia and yielded significant lysis of adhesions, which regained full motion.  The articular cartilage was intact, the biceps tendon was also intact, although there was the erythematous sheen across the synovium consistent with adhesive capsulitis. I was able to visualize the lysis of capsule at the inferior aspect of the humeral head after the manipulation. The  supraspinatus was pristine along with the cable at the superior rotator cuff from the articular side. The subscapularis was pristine. The biceps pulley was intact, and so was the infraspinatus.  There was substantial thickening of the subacromial bursa, as well as fraying of the CA ligament, and probably 30% tearing of the bursal side of the supraspinatus and a very small isolated location. This was not full thickness.  OPERATIVE PROCEDURE: The patient is brought to the operating room and placed in the supine position. General anesthesia was administered. Time out was performed. The right upper extremity was examined, with the above-named findings. Manipulation yielded significant lysis of adhesions.  He was in a semilateral decubitus position, all bony prominences padded, and diagnostic arthroscopy was carried out after a sterile prep and drape and time out was performed.  The above-named findings were noted, and the arthroscopic shaver was used to debride a small aspect of the superior labrum.  I then went to the subacromial space, and complete bursectomy was performed using a lateral portal. The tendon was closely inspected, and found to be essentially intact with the exception of some small bursal sided fraying laterally. The arthroscopic shaver and the arthroscopic ArthroCare was used to perform the bursectomy as well as released the CA ligament and then a 6-0 burr was used to perform a light acromioplasty. Confirmation of satisfactory acromioplasty was made from the lateral view, and I also inspected the tendon.  After complete debridement had been performed, along with bursectomy and CA ligament release and acromioplasty, the instruments were removed, the shoulder drained, and the portals closed with Monocryl followed by Steri-Strips and sterile gauze. He was awakened and returned to the PACU in stable and satisfactory condition. There were no complications and he tolerated the procedure well. His  recovery from the he's capsulitis may be  retarded by the presence of his diabetes.

## 2013-11-09 NOTE — Anesthesia Procedure Notes (Addendum)
Anesthesia Regional Block:  Interscalene brachial plexus block  Pre-Anesthetic Checklist: ,, timeout performed, Correct Patient, Correct Site, Correct Laterality, Correct Procedure, Correct Position, site marked, Risks and benefits discussed,  Surgical consent,  Pre-op evaluation,  At surgeon's request and post-op pain management  Laterality: Right  Prep: chloraprep       Needles:  Injection technique: Single-shot  Needle Type: Echogenic Stimulator Needle      Needle Gauge: 22 and 22 G    Additional Needles:  Procedures: ultrasound guided (picture in chart) and nerve stimulator Interscalene brachial plexus block Narrative:  Start time: 11/09/2013 7:50 AM End time: 11/09/2013 7:55 AM Injection made incrementally with aspirations every 5 mL.  Performed by: Personally   Additional Notes: 25 cc 0.5% marcaine 1:200 epi with 5 mg decadron injected easily   Procedure Name: Intubation Performed by: York Grice Pre-anesthesia Checklist: Patient identified, Timeout performed, Emergency Drugs available, Suction available and Patient being monitored Patient Re-evaluated:Patient Re-evaluated prior to inductionOxygen Delivery Method: Circle system utilized Preoxygenation: Pre-oxygenation with 100% oxygen Intubation Type: IV induction Ventilation: Mask ventilation without difficulty Laryngoscope Size: Miller and 2 Grade View: Grade I Tube type: Oral Tube size: 8.0 mm Number of attempts: 1 Airway Equipment and Method: Stylet Placement Confirmation: ETT inserted through vocal cords under direct vision,  positive ETCO2 and breath sounds checked- equal and bilateral Secured at: 22 cm Tube secured with: Tape Dental Injury: Teeth and Oropharynx as per pre-operative assessment     Performed by: York Grice

## 2013-11-12 ENCOUNTER — Encounter (HOSPITAL_BASED_OUTPATIENT_CLINIC_OR_DEPARTMENT_OTHER): Payer: Self-pay | Admitting: Orthopedic Surgery

## 2013-11-13 ENCOUNTER — Ambulatory Visit (INDEPENDENT_AMBULATORY_CARE_PROVIDER_SITE_OTHER): Payer: Commercial Managed Care - PPO

## 2013-11-13 DIAGNOSIS — Z23 Encounter for immunization: Secondary | ICD-10-CM

## 2014-01-08 ENCOUNTER — Ambulatory Visit (INDEPENDENT_AMBULATORY_CARE_PROVIDER_SITE_OTHER): Payer: PRIVATE HEALTH INSURANCE | Admitting: *Deleted

## 2014-01-08 DIAGNOSIS — Z23 Encounter for immunization: Secondary | ICD-10-CM

## 2014-02-08 ENCOUNTER — Telehealth: Payer: Self-pay

## 2014-02-08 ENCOUNTER — Other Ambulatory Visit (INDEPENDENT_AMBULATORY_CARE_PROVIDER_SITE_OTHER): Payer: PRIVATE HEALTH INSURANCE

## 2014-02-08 DIAGNOSIS — E1149 Type 2 diabetes mellitus with other diabetic neurological complication: Secondary | ICD-10-CM

## 2014-02-08 LAB — BASIC METABOLIC PANEL
BUN: 15 mg/dL (ref 6–23)
CO2: 29 mEq/L (ref 19–32)
CREATININE: 0.9 mg/dL (ref 0.4–1.5)
Calcium: 9.8 mg/dL (ref 8.4–10.5)
Chloride: 102 mEq/L (ref 96–112)
GFR: 90.48 mL/min (ref >60.00)
Glucose, Bld: 122 mg/dL — ABNORMAL HIGH (ref 70–99)
Potassium: 4.7 mEq/L (ref 3.5–5.1)
Sodium: 139 mEq/L (ref 135–145)

## 2014-02-08 LAB — HEMOGLOBIN A1C: HEMOGLOBIN A1C: 6.8 % — AB (ref 4.6–6.5)

## 2014-02-08 NOTE — Telephone Encounter (Signed)
Lab called requesting orders be placed

## 2014-02-21 ENCOUNTER — Encounter: Payer: Self-pay | Admitting: Internal Medicine

## 2014-02-21 ENCOUNTER — Ambulatory Visit (INDEPENDENT_AMBULATORY_CARE_PROVIDER_SITE_OTHER): Payer: PRIVATE HEALTH INSURANCE | Admitting: Internal Medicine

## 2014-02-21 VITALS — BP 120/60 | HR 61 | Temp 97.6°F | Ht 72.0 in | Wt 194.0 lb

## 2014-02-21 DIAGNOSIS — E119 Type 2 diabetes mellitus without complications: Secondary | ICD-10-CM

## 2014-02-21 DIAGNOSIS — I10 Essential (primary) hypertension: Secondary | ICD-10-CM

## 2014-02-21 DIAGNOSIS — Z0000 Encounter for general adult medical examination without abnormal findings: Secondary | ICD-10-CM

## 2014-02-21 DIAGNOSIS — E785 Hyperlipidemia, unspecified: Secondary | ICD-10-CM

## 2014-02-21 DIAGNOSIS — E1149 Type 2 diabetes mellitus with other diabetic neurological complication: Secondary | ICD-10-CM

## 2014-02-21 MED ORDER — GLIMEPIRIDE 2 MG PO TABS: 2.0000 mg | ORAL_TABLET | Freq: Every day | ORAL | Status: DC

## 2014-02-21 MED ORDER — LOSARTAN POTASSIUM 50 MG PO TABS: 50.0000 mg | ORAL_TABLET | Freq: Every day | ORAL | Status: DC

## 2014-02-21 MED ORDER — ROSUVASTATIN CALCIUM 10 MG PO TABS: 10.0000 mg | ORAL_TABLET | Freq: Every evening | ORAL | Status: DC

## 2014-02-21 MED ORDER — OMEPRAZOLE 20 MG PO CPDR: 20.0000 mg | DELAYED_RELEASE_CAPSULE | Freq: Two times a day (BID) | ORAL | Status: DC

## 2014-02-21 MED ORDER — PIOGLITAZONE HCL 30 MG PO TABS: 30.0000 mg | ORAL_TABLET | Freq: Every day | ORAL | Status: DC

## 2014-02-21 MED ORDER — METFORMIN HCL 500 MG PO TABS: 500.0000 mg | ORAL_TABLET | Freq: Two times a day (BID) | ORAL | Status: DC

## 2014-02-21 MED ORDER — GABAPENTIN 300 MG PO CAPS: 300.0000 mg | ORAL_CAPSULE | Freq: Three times a day (TID) | ORAL | Status: DC

## 2014-02-21 NOTE — Patient Instructions (Signed)
Thanks for letting me help with your health care.  Your exam today is fine.  Labs are ordered.  Will transfer PCP to Dr. Elsie Stain

## 2014-02-21 NOTE — Progress Notes (Signed)
Subjective:    Patient ID: Ian Johnson, male    DOB: 03-Feb-1956, 58 y.o.   MRN: [id number]  HPI Ian Johnson presents for general wellness exam and chronic disease management. He is feeling well. He is current with dental and eye care. He follows a healthy diet. He is exercising but his peripheral neuropathy does limit him but the recumbent bike is easier. He is doing exercise for his shoulder.  Past Medical History  Diagnosis Date  . Personal history of urinary calculi   . Nonspecific elevation of levels of transaminase or lactic acid dehydrogenase (LDH)   . Backache, unspecified   . Other and unspecified hyperlipidemia   . Esophageal reflux   . Diabetes mellitus without mention of complication   . Unspecified essential hypertension     no meds now  . Peripheral neuropathy   . Arthritis   . 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 Wears glasses   . Complex tear of medial meniscus of left knee as current injury 01/19/2013  . Adhesive capsulitis of right shoulder 11/09/2013   Past Surgical History  Procedure Laterality Date  . Lumbar disc surgery  1990    l4 and l5  . Eye surgery  [phone]    multiple lt eye reconstruction surg -glass eye  . Colonoscopy    . Upper gi endoscopy    . Knee arthroscopy with medial menisectomy Left 01/19/2013    Procedure: KNEE ARTHROSCOPY WITH MEDIAL MENISECTOMY;  Surgeon: Johnny Bridge, MD;  Location: [facility];  Service: Orthopedics;  Laterality: Left;  LEFT KNEE SCOPE WITH DEBRIDEMENT AND partial MEDIAL MENISECTOMY  . Chondroplasty Left 01/19/2013    Procedure: CHONDROPLASTY;  Surgeon: Johnny Bridge, MD;  Location: [facility];  Service: Orthopedics;  Laterality: Left;  ARTHROSCOPY KNEE WITH DEBRIDEMENT/SHAVING (CHONDROPLASTY)  . Shoulder arthroscopy with rotator cuff repair and subacromial decompression Right 11/09/2013    Procedure: RIGHT SHOULDER ARTHROSCOPY WITH DEBRIDEMENT OF PARTIAL ROTATOR CUFF TEAR AND  SUBACROMIAL DECOMPRESSION ;  Surgeon: Johnny Bridge, MD;  Location: [facility];  Service: Orthopedics;  Laterality: Right;   Family History  Problem Relation Age of Onset  . Coronary artery disease Mother   . Coronary artery disease Father   . Peripheral vascular disease Father    History   Social History  . Marital Status: Married    Spouse Name: N/A    Number of Children: 2  . Years of Education: 12   Occupational History  . Clinical research associate    Social History Main Topics  . Smoking status: Never Smoker   . Smokeless tobacco: Never Used  . Alcohol Use: No  . Drug Use: No  . Sexual Activity: Yes    Partners: Female   Other Topics Concern  . Not on file   Social History Narrative   HSG. married 7 years - divorced; remarried /93. 1 daughter; 1 son - PA at [place] [employer]. Work - self-employed Clinical research associate.    Current Outpatient Prescriptions on File Prior to Visit  Medication Sig Dispense Refill  . Ascorbic Acid (VITAMIN C) 500 MG CAPS Take 1 each by mouth daily.      Marland Kitchen aspirin 81 MG EC tablet Take 81 mg by mouth daily.        . Coenzyme Q10 (CO Q-10) 100 MG CAPS Take 200 mg by mouth daily.        . fish oil-omega-3 fatty acids 1000  MG capsule Take 2 g by mouth daily. 1200 mg daily.      . Ginkgo Biloba 60 MG CAPS Take 1 each by mouth daily.      Marland Kitchen glimepiride (AMARYL) 2 MG tablet Take 1 tablet (2 mg total) by mouth daily.  90 tablet  3  . losartan (COZAAR) 50 MG tablet Take 1 tablet (50 mg total) by mouth daily.  60 tablet  1  . Multiple Vitamin (MULTIVITAMIN) tablet Take 1 tablet by mouth daily.        Marland Kitchen omeprazole (PRILOSEC) 20 MG capsule Take 1 capsule (20 mg total) by mouth 2 (two) times daily.  180 capsule  3  . pioglitazone (ACTOS) 30 MG tablet Take 1 tablet (30 mg total) by mouth daily.  90 tablet  3  . gabapentin (NEURONTIN) 300 MG capsule 300 mg 3 (three) times daily. Take three times a day      . metFORMIN (GLUCOPHAGE) 500 MG tablet Take 1 tablet (500 mg  total) by mouth 2 (two) times daily with a meal.  180 tablet  3  . rosuvastatin (CRESTOR) 10 MG tablet Take 10 mg by mouth every evening.       No current facility-administered medications on file prior to visit.      Review of Systems Constitutional:  Negative for fever, chills, activity change and unexpected weight change.  HEENT:  Negative for hearing loss, ear pain, congestion, neck stiffness and postnasal drip. Negative for sore throat or swallowing problems. Negative for dental complaints.   Eyes: Negative for vision loss or change in visual acuity.  Respiratory: Negative for chest tightness and wheezing. Negative for DOE.   Cardiovascular: Negative for chest pain or palpitations. No decreased exercise tolerance Gastrointestinal: No change in bowel habit. No bloating or gas. No reflux or indigestion Genitourinary: Negative for urgency, frequency, flank pain and difficulty urinating.  Musculoskeletal: Negative for myalgias, back pain, arthralgias and gait problem.  Neurological: Negative for dizziness, tremors, weakness and headaches.  Hematological: Negative for adenopathy.  Psychiatric/Behavioral: Negative for behavioral problems and dysphoric mood.       Objective:   Physical Exam Filed Vitals:   02/21/14 0922  BP: 120/60  Pulse: 61  Temp: 97.6 F (36.4 C)   Wt Readings from Last 3 Encounters:  02/21/14 194 lb (87.998 kg)  11/09/13 183 lb 6 oz (83.178 kg)  11/09/13 183 lb 6 oz (83.178 kg)   Gen'l: Well nourished well developed male in no acute distress  HEENT: Head: Normocephalic and atraumatic. Right Ear: External ear normal. EAC/TM nl. Left Ear: External ear normal.  EAC/TM nl. Nose: Nose normal. Mouth/Throat: Oropharynx is clear and moist. Dentition - native, in good repair. No buccal or palatal lesions. Posterior pharynx clear. Eye: Conjunctivae and sclera clear. EOM intact. Pupil are equal, round, and reactive to light. Right eye exhibits no discharge. Left  eye-prosthetic.  Neck: Normal range of motion. Neck supple. No JVD present. No tracheal deviation present. No thyromegaly present.  Cardiovascular: Normal rate, regular rhythm, no gallop, no friction rub, no murmur heard.      Quiet precordium. 2+ radial and DP pulses . No carotid bruits Pulmonary/Chest: Effort normal. No respiratory distress or increased WOB, no wheezes, no rales. No chest wall deformity or CVAT. Abdomen: Soft. Bowel sounds are normal in all quadrants. He exhibits no distension, no tenderness, no rebound or guarding, No heptosplenomegaly  Genitourinary:  deferred Musculoskeletal: Normal range of motion. He exhibits no edema and no tenderness.  Small and large joints without redness, synovial thickening or deformity. Full range of motion preserved about all small, median and large joints.  Lymphadenopathy:    He has no cervical or supraclavicular adenopathy.  Neurological: He is alert and oriented to person, place, and time. CN II-XII intact. DTRs 2+ and symmetrical biceps, radial and patellar tendons. Cerebellar function normal with no tremor, rigidity, normal gait and station.  Skin: Skin is warm and dry. No rash noted. No erythema.  Psychiatric: He has a normal mood and affect. His behavior is normal. Thought content normal.   Recent Results (from the past 2160 hour(s))  HEMOGLOBIN A1C     Status: Abnormal   Collection Time    02/08/14  9:13 AM      Result Value Ref Range   Hemoglobin A1C 6.8 (*) 4.6 - 6.5 %   Comment: Glycemic Control Guidelines for People with Diabetes:Non Diabetic:  <6%Goal of Therapy: <7%Additional Action Suggested:  >5%   BASIC METABOLIC PANEL     Status: Abnormal   Collection Time    02/08/14  9:13 AM      Result Value Ref Range   Sodium 139  135 - 145 mEq/L   Potassium 4.7  3.5 - 5.1 mEq/L   Chloride 102  96 - 112 mEq/L   CO2 29  19 - 32 mEq/L   Glucose, Bld 122 (*) 70 - 99 mg/dL   BUN 15  6 - 23 mg/dL   Creatinine, Ser 0.9  0.4 - 1.5  mg/dL   Calcium 9.8  8.4 - 10.5 mg/dL   GFR 90.48  >60.00 mL/min         Assessment & Plan:

## 2014-02-21 NOTE — Progress Notes (Signed)
Pre visit review using our clinic review tool, if applicable. No additional management support is needed unless otherwise documented below in the visit note. 

## 2014-02-22 ENCOUNTER — Telehealth: Payer: Self-pay | Admitting: Internal Medicine

## 2014-02-22 NOTE — Telephone Encounter (Signed)
Relevant patient education assigned to patient using Emmi. ° °

## 2014-02-23 DIAGNOSIS — Z0000 Encounter for general adult medical examination without abnormal findings: Secondary | ICD-10-CM | POA: Insufficient documentation

## 2014-02-23 NOTE — Assessment & Plan Note (Signed)
Profound peripheral neuropathy which limits activities and is source of work disability  Plan  Tight control diabetes  If medication needed he is a candidate for gabapentin.

## 2014-02-23 NOTE — Assessment & Plan Note (Signed)
BP Readings from Last 3 Encounters:  02/21/14 120/60  11/09/13 141/60  11/09/13 141/60   Bmet is normal.  Good control on present medications - will continue present regimen.

## 2014-02-23 NOTE — Assessment & Plan Note (Signed)
Lab Results  Component Value Date   HGBA1C 6.8* 02/08/2014   Good control on present medication. He is current with ophthalmology.  Plan Continue present regimen  Continue q 4 month labs - orders placed.

## 2014-02-23 NOTE — Assessment & Plan Note (Signed)
Taking and tolerating "statin" therapy. Last LDL  Better than goal. HDL is fine. LFTs normal.  Plan Continue Crestor  Follow up lab 4 months.

## 2014-02-23 NOTE — Assessment & Plan Note (Signed)
Interval history is ok w/o major illness or surgery. Physical exam is OK. He is current with colorectal cancer screening. Discussed pros and cons of prostate cancer screening (USPHCTF recommendations reviewed and AUA  April '13 recommendations) and he defers evaluation at this time. Immunizations - current.  In summary  a great guy who is medically stable. He will have follow up lab in 4 months. He will transfer care to Dr. Damita Dunnings at [facility].

## 2014-02-25 ENCOUNTER — Telehealth: Payer: Self-pay | Admitting: *Deleted

## 2014-02-25 MED ORDER — ETODOLAC 500 MG PO TABS: 500.0000 mg | ORAL_TABLET | Freq: Two times a day (BID) | ORAL | Status: DC

## 2014-02-25 NOTE — Telephone Encounter (Signed)
RX added to list and sent e-script. Pt notified and stated this should be for mail order. RX resent

## 2014-02-25 NOTE — Telephone Encounter (Signed)
Ok for etodolac 500 mg bid. #60 refill prn.

## 2014-02-25 NOTE — Telephone Encounter (Signed)
Pt called requesting Etodolac 500mg  refill.  Medication is no longer on current med list.  Please advise

## 2014-03-01 ENCOUNTER — Telehealth: Payer: Self-pay

## 2014-03-01 NOTE — Telephone Encounter (Signed)
Relevant patient education assigned to patient using Emmi. ° °

## 2014-04-04 ENCOUNTER — Other Ambulatory Visit: Payer: Self-pay | Admitting: Dermatology

## 2014-04-08 ENCOUNTER — Encounter: Payer: Self-pay | Admitting: Family Medicine

## 2014-04-08 DIAGNOSIS — C4491 Basal cell carcinoma of skin, unspecified: Secondary | ICD-10-CM | POA: Insufficient documentation

## 2014-04-30 ENCOUNTER — Telehealth: Payer: Self-pay | Admitting: Family Medicine

## 2014-04-30 NOTE — Telephone Encounter (Signed)
Put him with me.  Thanks.

## 2014-04-30 NOTE — Telephone Encounter (Signed)
Pt states Dr. Crissie Sickles was supposed to xfer care to you.  I see in pt's chart that Dr. Crissie Sickles made a note that care would xferred to you, but you have reached your max for xfer patients and I didn't know if Dr. Crissie Sickles had notified you in ref to this pt or not. Can you accept this pt? Thank you

## 2014-04-30 NOTE — Telephone Encounter (Signed)
Scheduled 06/18/2014

## 2014-06-08 ENCOUNTER — Other Ambulatory Visit: Payer: Self-pay | Admitting: Family Medicine

## 2014-06-08 DIAGNOSIS — E1149 Type 2 diabetes mellitus with other diabetic neurological complication: Secondary | ICD-10-CM

## 2014-06-11 ENCOUNTER — Other Ambulatory Visit (INDEPENDENT_AMBULATORY_CARE_PROVIDER_SITE_OTHER): Payer: PRIVATE HEALTH INSURANCE

## 2014-06-11 DIAGNOSIS — E1149 Type 2 diabetes mellitus with other diabetic neurological complication: Secondary | ICD-10-CM

## 2014-06-11 LAB — LIPID PANEL
CHOL/HDL RATIO: 3
Cholesterol: 148 mg/dL (ref 0–200)
HDL: 57.1 mg/dL (ref >39.00)
LDL CALC: 74 mg/dL (ref 0–99)
NONHDL: 90.9
Triglycerides: 87 mg/dL (ref 0.0–149.0)
VLDL: 17.4 mg/dL (ref 0.0–40.0)

## 2014-06-11 LAB — COMPREHENSIVE METABOLIC PANEL
ALK PHOS: 35 U/L — AB (ref 39–117)
ALT: 33 U/L (ref 0–53)
AST: 30 U/L (ref 0–37)
Albumin: 4.2 g/dL (ref 3.5–5.2)
BILIRUBIN TOTAL: 0.8 mg/dL (ref 0.2–1.2)
BUN: 20 mg/dL (ref 6–23)
CO2: 29 mEq/L (ref 19–32)
Calcium: 9.3 mg/dL (ref 8.4–10.5)
Chloride: 101 mEq/L (ref 96–112)
Creatinine, Ser: 0.9 mg/dL (ref 0.4–1.5)
GFR: 88.14 mL/min (ref >60.00)
Glucose, Bld: 144 mg/dL — ABNORMAL HIGH (ref 70–99)
Potassium: 4.8 mEq/L (ref 3.5–5.1)
Sodium: 138 mEq/L (ref 135–145)
TOTAL PROTEIN: 7.8 g/dL (ref 6.0–8.3)

## 2014-06-11 LAB — HEMOGLOBIN A1C: HEMOGLOBIN A1C: 6.6 % — AB (ref 4.6–6.5)

## 2014-06-18 ENCOUNTER — Encounter: Payer: Self-pay | Admitting: Family Medicine

## 2014-06-18 ENCOUNTER — Ambulatory Visit (INDEPENDENT_AMBULATORY_CARE_PROVIDER_SITE_OTHER)
Admission: RE | Admit: 2014-06-18 | Discharge: 2014-06-18 | Disposition: A | Discharge status: Home / Self Care | Payer: PRIVATE HEALTH INSURANCE | Source: Ambulatory Visit | Attending: Family Medicine | Admitting: Family Medicine

## 2014-06-18 ENCOUNTER — Ambulatory Visit (INDEPENDENT_AMBULATORY_CARE_PROVIDER_SITE_OTHER): Payer: PRIVATE HEALTH INSURANCE | Admitting: Family Medicine

## 2014-06-18 VITALS — BP 110/62 | HR 61 | Temp 97.6°F | Ht 72.0 in | Wt 194.5 lb

## 2014-06-18 DIAGNOSIS — M79609 Pain in unspecified limb: Secondary | ICD-10-CM

## 2014-06-18 DIAGNOSIS — N529 Male erectile dysfunction, unspecified: Secondary | ICD-10-CM

## 2014-06-18 DIAGNOSIS — E785 Hyperlipidemia, unspecified: Secondary | ICD-10-CM

## 2014-06-18 DIAGNOSIS — I10 Essential (primary) hypertension: Secondary | ICD-10-CM

## 2014-06-18 DIAGNOSIS — M79672 Pain in left foot: Secondary | ICD-10-CM

## 2014-06-18 DIAGNOSIS — E1149 Type 2 diabetes mellitus with other diabetic neurological complication: Secondary | ICD-10-CM

## 2014-06-18 MED ORDER — PIOGLITAZONE HCL 30 MG PO TABS: 15.0000 mg | ORAL_TABLET | Freq: Every day | ORAL | Status: DC

## 2014-06-18 MED ORDER — VARDENAFIL HCL 10 MG PO TABS: 10.0000 mg | ORAL_TABLET | Freq: Every day | ORAL | Status: DC | PRN

## 2014-06-18 NOTE — Patient Instructions (Signed)
Go to the lab on the way out.  We'll contact you with your xray report. Cut the actos in half.  Recheck A1c in about 4 months before a visit.  Try to keep exercising and working on your diet.  Let me check some options for the neuropathy and foot pain.  Glad to see you.

## 2014-06-18 NOTE — Progress Notes (Signed)
Pre visit review using our clinic review tool, if applicable. No additional management support is needed unless otherwise documented below in the visit note.  Patient is new to me.  Detailed encounter.    Diabetes:  Using medications without difficulties:yes Hypoglycemic episodes:no Hyperglycemic episodes:no Feet problems: continues to have pain with h/o neuropathy.  Had seen Dr. Carney Bern, with insert helping the plantar pain, but still having dorsal pain.  Blood Sugars averaging:  ~130-140 eye exam within last year:  Yes, in the last year, Loss of L eye noted.  We talked about actos.  Med is tolerated (no h/o bladder CA and no h/o blood in urine) and benefit from control of DM2 outweighs other considerations.  Pt agrees to continue actos.   See plan re: med and A1c.    H/o ED, some improvement with levitra and no ADE.  D/w pt.  Needed a refill.   Hypertension:    Using medication without problems or lightheadedness: occ lightheaded, but that is rare.  Chest pain with exertion:no Edema:no Short of breath:no  Elevated Cholesterol: Using medications without problems:yes Muscle aches: likely not from statin, aches noted at baseline.  Diet compliance: yes Exercise:limited.   PMH and SH reviewed.   Vital signs, Meds and allergies reviewed.  ROS: See HPI.  Otherwise nontributory.   GEN: nad, alert and oriented HEENT: mucous membranes moist NECK: supple w/o LA CV: rrr.  PULM: ctab, no inc wob ABD: soft, +bs EXT: no edema SKIN: no acute rash L dorsal midfoot ttp but normal inspection, not red or puffy.   Diabetic foot exam: Normal inspection No skin breakdown No calluses  Normal DP pulses Dec sensation to light tough and monofilament Nails normal

## 2014-06-19 DIAGNOSIS — N529 Male erectile dysfunction, unspecified: Secondary | ICD-10-CM | POA: Insufficient documentation

## 2014-06-19 NOTE — Assessment & Plan Note (Signed)
Continue prn levitra.

## 2014-06-19 NOTE — Assessment & Plan Note (Signed)
I'll work on his insurance papers for disability.  Dec actos given his A1c improvement.  He agrees.  Recheck in 4 months.  Labs d/w pt. Will ask for Dr. Lorelei Pont to see patient about his foot pain. See notes on xray, reviewed film.

## 2014-06-19 NOTE — Assessment & Plan Note (Signed)
Controlled, continue current meds.  Labs d/w pt.  >40 minutes spent in face to face time with patient, >50% spent in counselling or coordination of care

## 2014-06-19 NOTE — Assessment & Plan Note (Signed)
Controlled, continue current meds. Labs d/w pt.  

## 2014-06-27 ENCOUNTER — Encounter: Payer: Self-pay | Admitting: Family Medicine

## 2014-06-27 ENCOUNTER — Ambulatory Visit (INDEPENDENT_AMBULATORY_CARE_PROVIDER_SITE_OTHER): Payer: PRIVATE HEALTH INSURANCE | Admitting: Family Medicine

## 2014-06-27 VITALS — BP 130/66 | HR 60 | Temp 98.1°F | Ht 72.0 in | Wt 191.5 lb

## 2014-06-27 DIAGNOSIS — M25579 Pain in unspecified ankle and joints of unspecified foot: Secondary | ICD-10-CM

## 2014-06-27 DIAGNOSIS — E1149 Type 2 diabetes mellitus with other diabetic neurological complication: Secondary | ICD-10-CM

## 2014-06-27 DIAGNOSIS — E1142 Type 2 diabetes mellitus with diabetic polyneuropathy: Secondary | ICD-10-CM

## 2014-06-27 MED ORDER — PREGABALIN 75 MG PO CAPS: 75.0000 mg | ORAL_CAPSULE | Freq: Two times a day (BID) | ORAL | Status: DC

## 2014-06-27 NOTE — Patient Instructions (Signed)
Take Lyrica once a day for one week.  Then increase to 1 tablet twice a day.

## 2014-06-27 NOTE — Progress Notes (Signed)
Pre visit review using our clinic review tool, if applicable. No additional management support is needed unless otherwise documented below in the visit note. 

## 2014-06-27 NOTE — Progress Notes (Signed)
[place] [address] Phone: [phone] Fax: [phone]  Patient ID: Ian Johnson MRN: [id number], DOB: Dec 23, 1955, 58 y.o. Date of Encounter: 06/27/2014  Primary Physician:  Elsie Stain, MD   Chief Complaint: Foot Pain   Subjective:   I remember patient well when I saw him a few years ago, per Dr. Linda Hedges request.   The patient has had diabetes for approximately 20 years ago and he has had significant polyneuropathy for many years. He has been very frustrated with this, and I remember from our last interactions, but he was quite frustrated, and had a difficult time with titrating up his Neurontin dose.  Recently, he is titrated up his Neurontin dose on his and to taking 1200 mg in total daily. He is taking at 300 mg tablet, normally 4 times daily, and occasionally 5 times daily. He does not think this is really helping his symptoms at all, and he has a significant amount of somnolence and is not think that he is thinking clearly when he takes it.  He has not had any specific trauma or injury.  He has tried a number of things for overall foot pain including things it sometimes and often help diabetic polyneuropathy patients with foot pain. He has tried Plastazote orthotics, metatarsal bars, and metatarsal pads for her metatarsalgia. These seemed to have helped him when I last spoke to him, but they are not doing so any longer, and he is no longer using them. He does try to get supportive footwear, and he has tried many types of shoes. Currently he is wearing Merrell's, and he thinks her relatively comfortable.  03/24/2011 OV Dr. Lorelei Pont 58 year old, f/u foot pain:  Continues with significant neuropathy of feet, titrating up neurontin. Metatarsalgia, with dramatic forefoot breakdown. Patient did not like MT pads that were placed last OV.  Obtained plastazote OTC diabetic orthotic, which is seemingly comfortable. Continues with forefoot pain, however.  Is open to  any suggestions.  02/24/2011 OV Has had DM since the age of 54. Does construction work, walks on rough work. Walks on all types of elevation. Has neuropathy on both of his feet. Feels like something is in his shoes when he puts them on. To control is DM, has been trying to work on his diet and exercise a lot. Currently taking Neurontin - but making somewhat drowsy on titration up. Takes 100 mg, now total dose 400 mg a day.  Has a lot of cramps in his legs, will be in his calves and up in his upper legs. Will also be in his upper toes. Cramps all throuh out the lowe legs and toes.   Used to take some quinine for leg cramps.   Metatarsalgia:  Has been to good feet, shoe market, and has spent a lot on different shoes and insoles.  Wears a work Leisure centre manager.  Significant longstanding hammertoes, now with a great deal of B forefoot pain. No specific trauma.   The PMH, PSH, Social History, Family History, Medications, and allergies have been reviewed in [facility], and have been updated if relevant.   REVIEW OF SYSTEMS  GEN: No fevers, chills. Nontoxic. Primarily MSK c/o today.  MSK: Detailed in the HPI  GI: tolerating PO intake without difficulty  Neuro: neuropathic sensations as described.  Otherwise the pertinent positives of the ROS are noted above.   Physical:  GEN: Well-developed,well-nourished,in no acute distress; alert,appropriate and cooperative throughout examination  HEENT: Normocephalic and atraumatic without obvious abnormalities.  Ears, externally no deformities  PULM: Breathing comfortably in no respiratory distress  EXT: No clubbing, cyanosis, or edema  PSYCH: Normally interactive. Cooperative during the interview. Pleasant. Friendly and conversant. Not anxious or depressed appearing. Normal, full affect.   FEET: B  Echymosis: no  Edema: no  ROM: full LE B  Gait: heel toe, non-antalgic, slight outward turn on R  MT pain: YES, PAIN WITH SQUEEZE AND MT HEADS 2-4 ALL DROPPED  Callus  pattern: 2-4 MILD  Lateral Mall: NT  Medial Mall: NT  Talus: NT  Navicular: NT  Cuboid: NT  Calcaneous: NT  Metatarsals: NT  5th MT: NT  Phalanges: NT  Achilles: NT  Plantar Fascia: NT  Fat Pad: NT  Peroneals: NT  Post Tib: NT  Great Toe: Nml motion  Ant Drawer: neg  ATFL: NT  CFL: NT  Deltoid: NT  Other foot breakdown: none  Long arch: MODERATE BREAKDOWN, NATURAL CAVUS FOOT  Transverse arch: SEVERE BREAKDOWN, SIGNIFICANT HAMMERTOE FORMATION, DROPPED MT HEADS  Hindfoot breakdown: none  Sensation: intact   Dg Foot Complete Left  06/18/2014   CLINICAL DATA:  Left mid foot pain  EXAM: LEFT FOOT - COMPLETE 3+ VIEW  COMPARISON:  None.  FINDINGS: Tarsal-metatarsal alignment is normal. No fracture is seen. Joint spaces appear normal.  IMPRESSION: Negative.   Electronically Signed   By: Ivar Drape M.D.   On: 06/18/2014 09:34    Diabetic polyneuropathy associated with type 2 diabetes mellitus  Pain in joint, ankle and foot, unspecified laterality  This is a very challenging case, the patient has advanced diabetic neuropathy. He has had diabetes for 20 years. He is very frustrated.  Unfortunately, think it is likely going to be a lifelong problem but will continually plague him. I discussed all this with him. Hopefully with using some neuropathic pain medications, he can have some improved relief. He is having some difficulty with a relatively low dose of gabapentin due to somnolence and lack of mental clarity with escalating doses.  We discussed options, and we are going to try him on Lyrica for now. Certainly this is still at relatively low dose, and he may need some titration upwards.  He had some difficulty with Cymbalta in the past. Other options would include Tri-Cyclic antidepressants such as Pamelor or Elavil.  I appreciate the opportunity to evaluate this very friendly patient. If you have any question regarding her care or prognosis, do not hesitate to ask.   New  Prescriptions   PREGABALIN (LYRICA) 75 MG CAPSULE    Take 1 capsule (75 mg total) by mouth 2 (two) times daily.   Patient Instructions  Take Lyrica once a day for one week.  Then increase to 1 tablet twice a day.    Follow-up: prn, but I would titrate up Lyrica if continued symptoms.  Unless noted above, the patient is to follow-up if symptoms worsen. Red flags were reviewed with the patient.  Signed,  Maud Deed. Zakari Bathe, MD, CAQ Sports Medicine   Discontinued Medications   No medications on file   Current Medications at Discharge:   Medication List       This list is accurate as of: 06/27/14 11:59 PM.  Always use your most recent med list.               aspirin 81 MG EC tablet  Take 81 mg by mouth daily.     Co Q-10 100 MG Caps  Take 200 mg by mouth daily.  etodolac 500 MG tablet  Commonly known as:  LODINE  Take 1 tablet (500 mg total) by mouth 2 (two) times daily.     fish oil-omega-3 fatty acids 1000 MG capsule  Take 2 g by mouth daily. 1200 mg daily.     gabapentin 300 MG capsule  Commonly known as:  NEURONTIN  Take 1 capsule (300 mg total) by mouth 3 (three) times daily. Take three times a day     Ginkgo Biloba 60 MG Caps  Take 1 each by mouth daily.     glimepiride 2 MG tablet  Commonly known as:  AMARYL  Take 1 tablet (2 mg total) by mouth daily.     losartan 50 MG tablet  Commonly known as:  COZAAR  Take 1 tablet (50 mg total) by mouth daily.     metFORMIN 500 MG tablet  Commonly known as:  GLUCOPHAGE  Take 1 tablet (500 mg total) by mouth 2 (two) times daily with a meal.     multivitamin tablet  Take 1 tablet by mouth daily.     omeprazole 20 MG capsule  Commonly known as:  PRILOSEC  Take 1 capsule (20 mg total) by mouth 2 (two) times daily.     pioglitazone 30 MG tablet  Commonly known as:  ACTOS  Take 0.5 tablets (15 mg total) by mouth daily.     pregabalin 75 MG capsule  Commonly known as:  LYRICA  Take 1 capsule (75 mg total)  by mouth 2 (two) times daily.     rosuvastatin 10 MG tablet  Commonly known as:  CRESTOR  Take 1 tablet (10 mg total) by mouth every evening.     vardenafil 10 MG tablet  Commonly known as:  LEVITRA  Take 1 tablet (10 mg total) by mouth daily as needed.     Vitamin C 500 MG Caps  Take 1 each by mouth daily.

## 2014-07-23 ENCOUNTER — Other Ambulatory Visit: Payer: Self-pay | Admitting: *Deleted

## 2014-07-23 MED ORDER — GLUCOSE BLOOD VI STRP: ORAL_STRIP | Status: DC

## 2014-07-26 ENCOUNTER — Other Ambulatory Visit: Payer: Self-pay | Admitting: *Deleted

## 2014-10-11 ENCOUNTER — Other Ambulatory Visit: Payer: Self-pay | Admitting: Family Medicine

## 2014-10-11 DIAGNOSIS — E1149 Type 2 diabetes mellitus with other diabetic neurological complication: Secondary | ICD-10-CM

## 2014-10-11 DIAGNOSIS — M5410 Radiculopathy, site unspecified: Principal | ICD-10-CM

## 2014-10-14 ENCOUNTER — Other Ambulatory Visit: Payer: PRIVATE HEALTH INSURANCE

## 2014-10-15 ENCOUNTER — Other Ambulatory Visit: Payer: PRIVATE HEALTH INSURANCE

## 2014-10-16 ENCOUNTER — Other Ambulatory Visit (INDEPENDENT_AMBULATORY_CARE_PROVIDER_SITE_OTHER): Payer: PRIVATE HEALTH INSURANCE

## 2014-10-16 DIAGNOSIS — E1149 Type 2 diabetes mellitus with other diabetic neurological complication: Secondary | ICD-10-CM

## 2014-10-16 DIAGNOSIS — M5410 Radiculopathy, site unspecified: Secondary | ICD-10-CM

## 2014-10-16 LAB — HEMOGLOBIN A1C: Hgb A1c MFr Bld: 6.5 % (ref 4.6–6.5)

## 2014-10-21 ENCOUNTER — Ambulatory Visit (INDEPENDENT_AMBULATORY_CARE_PROVIDER_SITE_OTHER): Payer: PRIVATE HEALTH INSURANCE | Admitting: Family Medicine

## 2014-10-21 ENCOUNTER — Encounter: Payer: Self-pay | Admitting: Family Medicine

## 2014-10-21 VITALS — BP 128/68 | HR 64 | Temp 98.7°F | Wt 199.0 lb

## 2014-10-21 DIAGNOSIS — E1140 Type 2 diabetes mellitus with diabetic neuropathy, unspecified: Secondary | ICD-10-CM

## 2014-10-21 DIAGNOSIS — E119 Type 2 diabetes mellitus without complications: Secondary | ICD-10-CM

## 2014-10-21 DIAGNOSIS — Z23 Encounter for immunization: Secondary | ICD-10-CM

## 2014-10-21 DIAGNOSIS — E1149 Type 2 diabetes mellitus with other diabetic neurological complication: Secondary | ICD-10-CM

## 2014-10-21 DIAGNOSIS — N529 Male erectile dysfunction, unspecified: Secondary | ICD-10-CM

## 2014-10-21 MED ORDER — SILDENAFIL CITRATE 20 MG PO TABS: 60.0000 mg | ORAL_TABLET | Freq: Every day | ORAL | Status: DC

## 2014-10-21 MED ORDER — PREGABALIN 75 MG PO CAPS: 75.0000 mg | ORAL_CAPSULE | Freq: Two times a day (BID) | ORAL | Status: DC

## 2014-10-21 MED ORDER — PIOGLITAZONE HCL 30 MG PO TABS: 15.0000 mg | ORAL_TABLET | Freq: Every day | ORAL | Status: DC

## 2014-10-21 NOTE — Patient Instructions (Signed)
Check with your insurance to see if they will cover the shingles shot. Recheck labs in about 3-4 months before a visit.   Take care. Glad to see you Cut the actos back to 15mg  a day in the meantime.

## 2014-10-21 NOTE — Progress Notes (Signed)
Pre visit review using our clinic review tool, if applicable. No additional management support is needed unless otherwise documented below in the visit note.  Diabetes:  Using medications without difficulties: yes, see above Hypoglycemic episodes:no Hyperglycemic episodes:no Feet problems: see above Blood Sugars averaging: usually 120-140s Eye exam within last year: due, done about 1 year ago.   D/w pt.   We talked about actos.  Med is tolerated (no h/o bladder CA and no h/o blood in urine) and benefit from control of DM2 outweighs other considerations.   A1c still controlled.  Has been on 30mg  of actos.   Pt agrees to continue actos but would cut back to 15mg  a day.   He got on lyrica and had some relief of his foot pain.  No ADE on the med other than some weight gain.   Levitra not covered, he was asking about changing to sildenafil 20mg /tab.  D/w pt.  rx printed.    He's been working on diet and exercise.  His R knee was injected by ortho prev and that helped some.   Meds, vitals, and allergies reviewed.   ROS: See HPI.  Otherwise negative.    GEN: nad, alert and oriented HEENT: mucous membranes moist NECK: supple w/o LA CV: rrr. PULM: ctab, no inc wob ABD: soft, +bs EXT: no edema SKIN: no acute rash  Diabetic foot exam: Normal inspection No skin breakdown No calluses  Normal DP pulses Dec sensation to light touch and monofilament Nails normal

## 2014-10-22 NOTE — Assessment & Plan Note (Signed)
Change to prn sildenafil.  rx given to patient.

## 2014-10-22 NOTE — Assessment & Plan Note (Signed)
Pt agrees to continue actos but would cut back to 15mg  a day.  Continue other meds as is.  He has normal sensation on foot exam.  H/o pain (presume DM neuropathy component) that is some better with lyrica.  Weight gain noted on med.  All d/w pt.  Continue as is.  Recheck in a few months with the dec in actos.

## 2014-11-26 ENCOUNTER — Ambulatory Visit (INDEPENDENT_AMBULATORY_CARE_PROVIDER_SITE_OTHER): Payer: PRIVATE HEALTH INSURANCE

## 2014-11-26 DIAGNOSIS — Z23 Encounter for immunization: Secondary | ICD-10-CM

## 2014-12-27 IMAGING — CR DG FOOT COMPLETE 3+V*L*
3 series · 3 of 3 positions shown · non-contrast
Comparison: None.

CLINICAL DATA: Left mid foot pain

EXAM:
LEFT FOOT - COMPLETE 3+ VIEW

[view not recorded (1 of 3)]
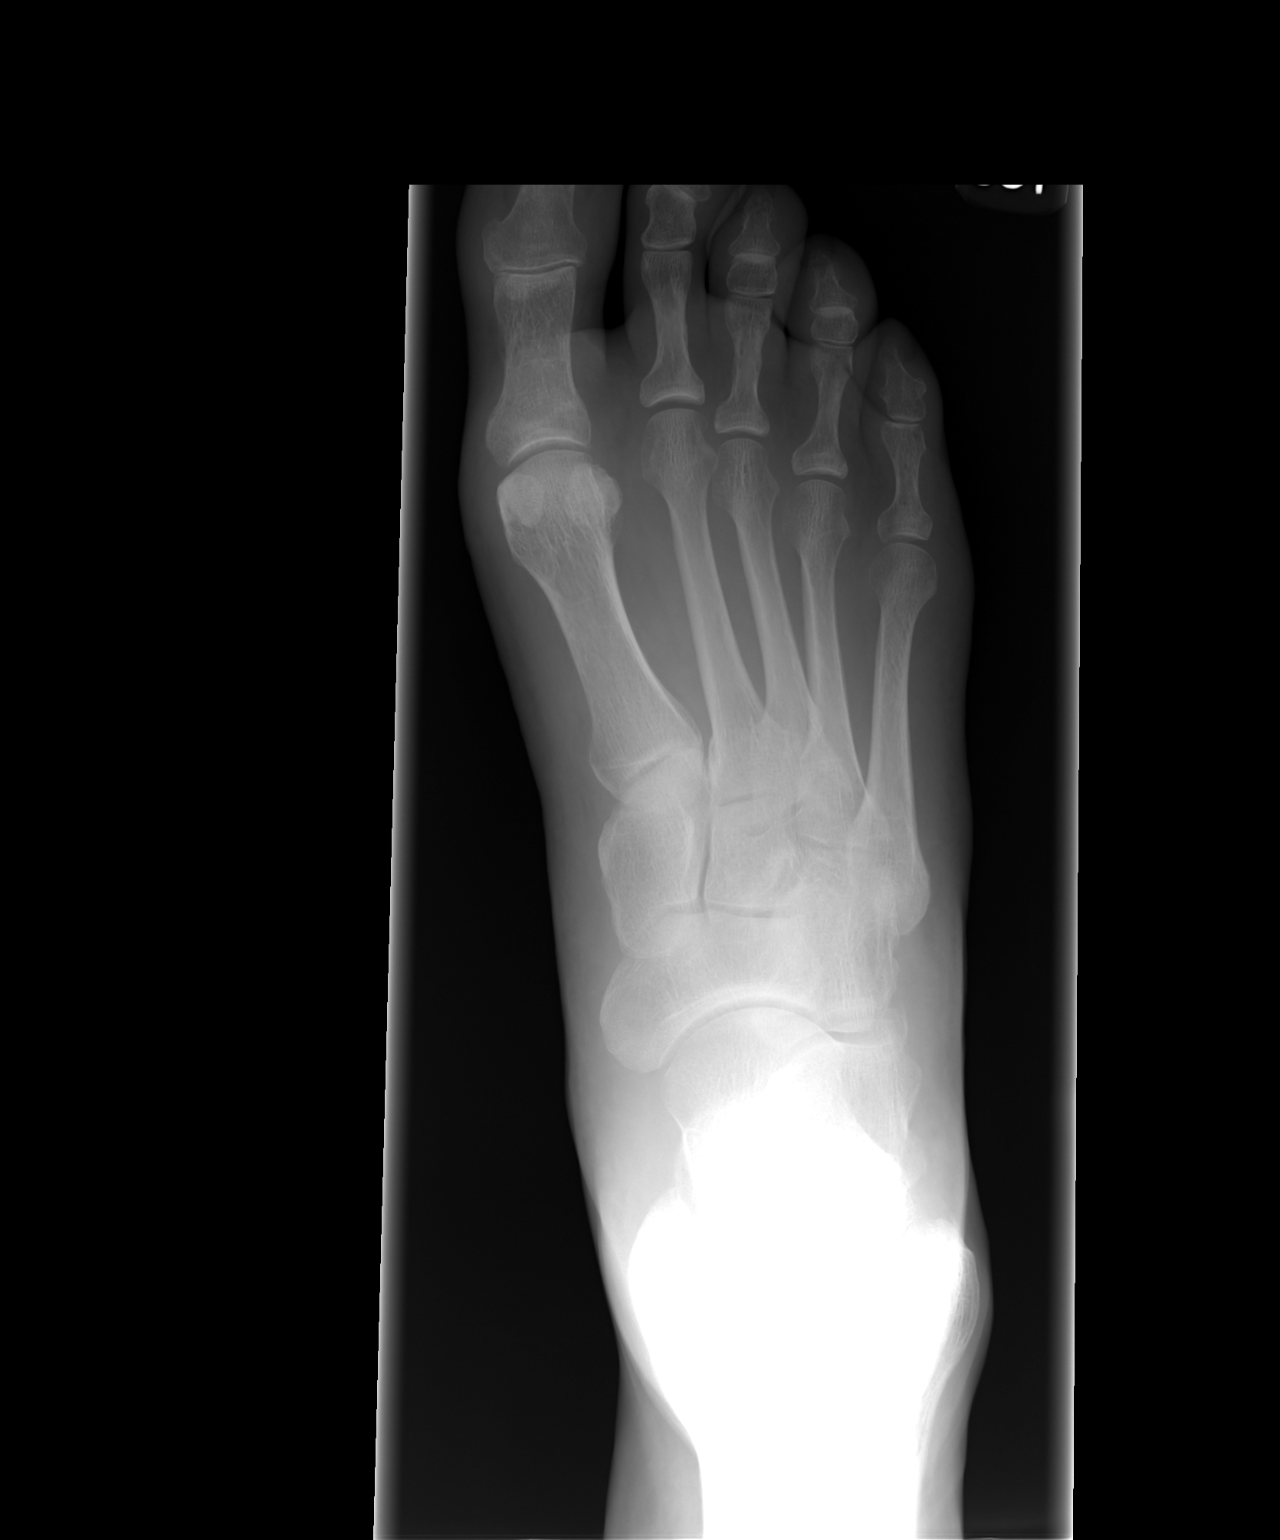

[view not recorded (2 of 3)]
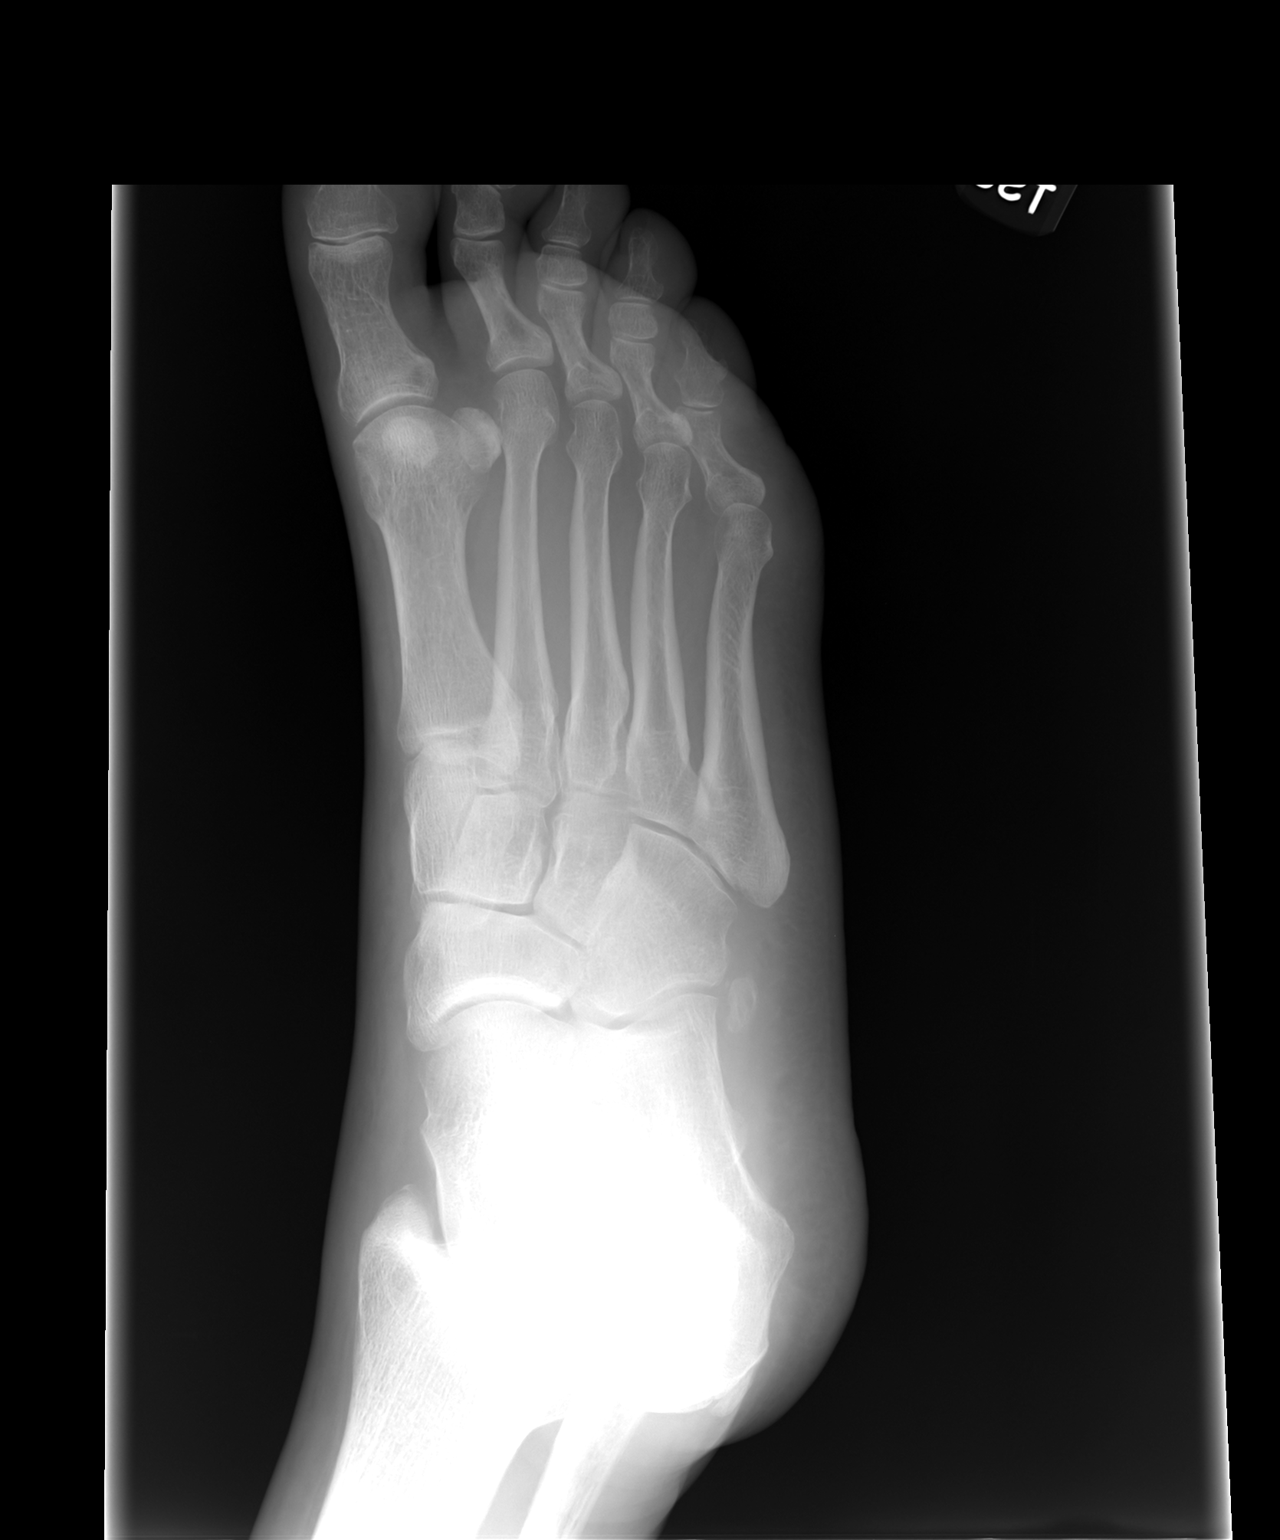

[view not recorded (3 of 3)]
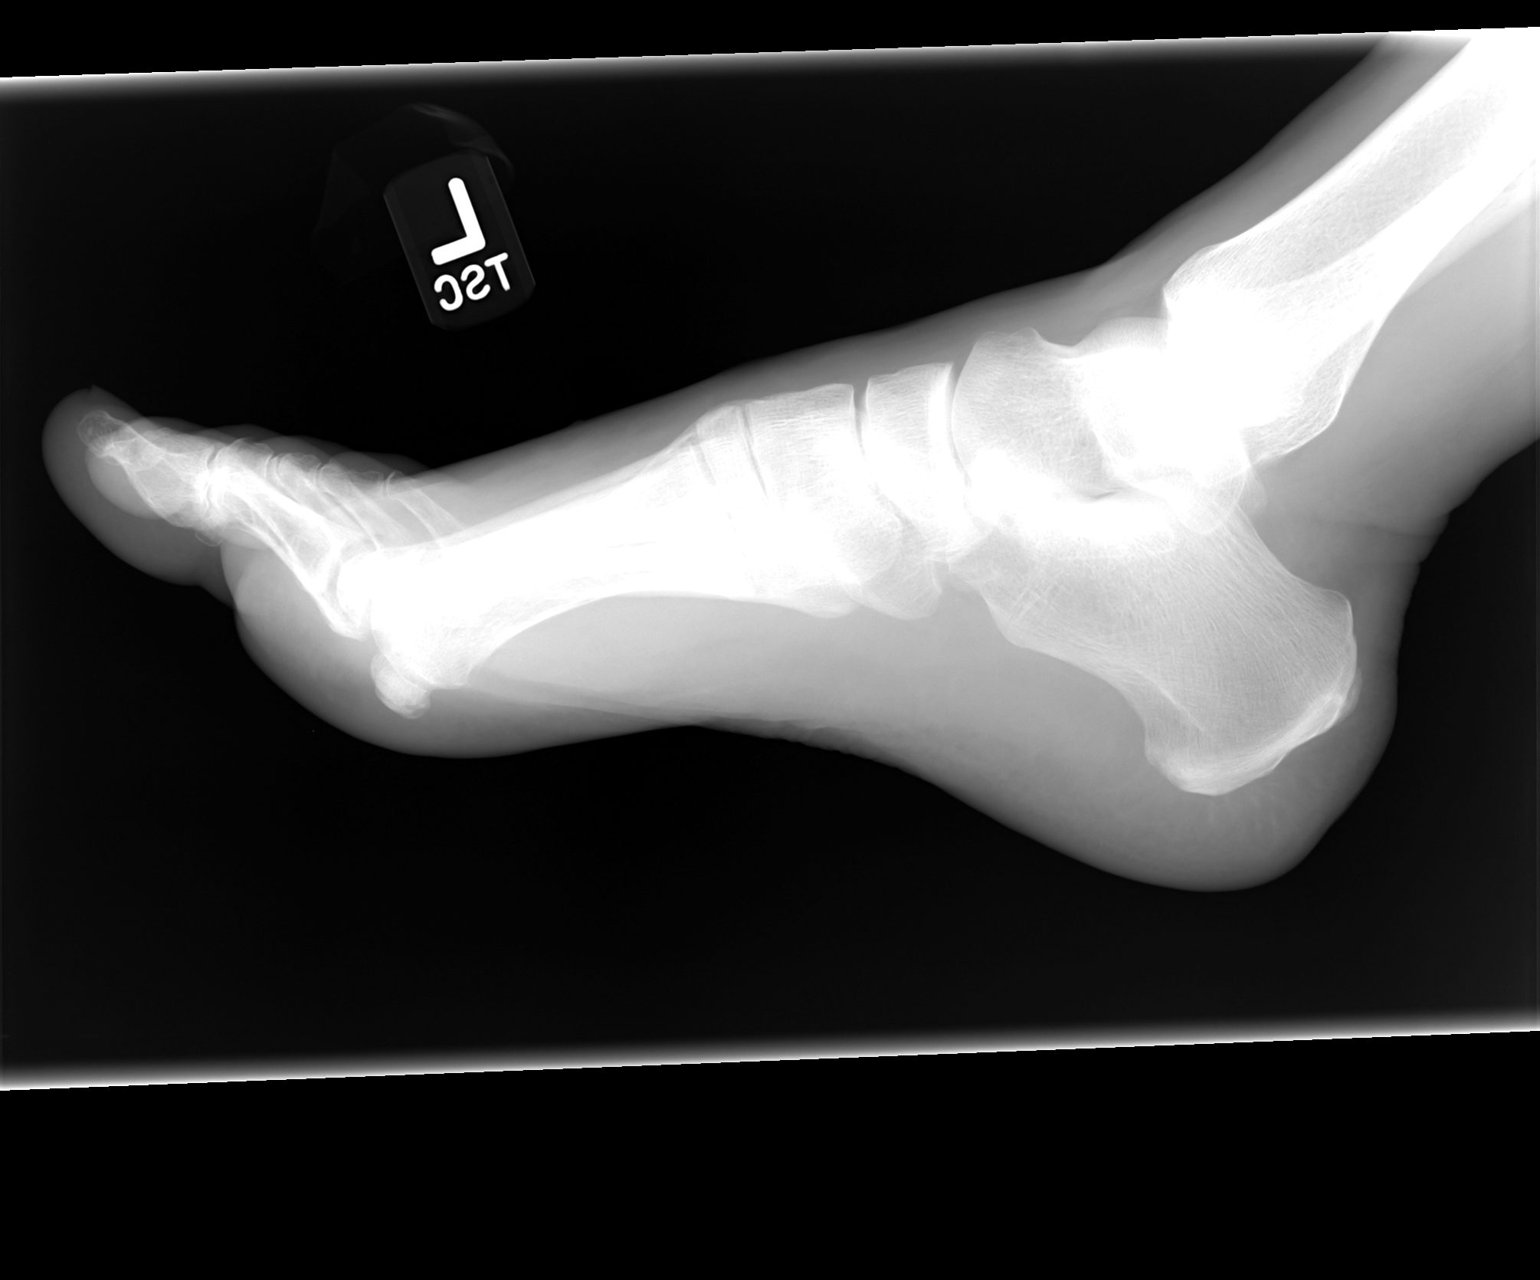

[3 of 3 positions shown; findings below may reference images not displayed]

FINDINGS: Tarsal-metatarsal alignment is normal. No fracture is seen. Joint
spaces appear normal.
IMPRESSION: Negative.

## 2015-01-06 LAB — HM DIABETES EYE EXAM

## 2015-01-29 ENCOUNTER — Telehealth: Payer: Self-pay | Admitting: Family Medicine

## 2015-01-29 NOTE — Telephone Encounter (Signed)
I understand that he doesn't want to come in, but I can't do much for him w/o seeing him.  Progressive cough, age >3, h/o Dm2 = needs OV.  Offer OV here or advise to get seen at [facility].

## 2015-01-29 NOTE — Telephone Encounter (Signed)
Noted, thanks!

## 2015-01-29 NOTE — Telephone Encounter (Signed)
Patient notified as instructed by telephone and verbalized understanding. Patient stated that he is going to see how he feels tomorrow and may go to the urgent care that his relative works at. Offered patient appointment here tomorrow which he declined.

## 2015-01-29 NOTE — Telephone Encounter (Signed)
Spoke with Amy at [facility] and she is concerned could be flu or bronchitis or pneumonia. Amy said Ok to wait and send note to Dr Damita Dunnings for the afternoon due to 24 hour outcome for triage with [facility]. Pt is not SOB or wheezing. Amy request cb to pt.

## 2015-01-29 NOTE — Telephone Encounter (Signed)
[place] Patient Name: Ian Johnson DOB: [birth year] Initial Comment Caller states, thinks he has the flu, bad cough that keeps getting worse, now also has body aches and chills Nurse Assessment Nurse: Mechele Dawley, RN, Amy Date/Time (Eastern Time): 01/29/2015 9:47:03 AM Confirm and document reason for call. If symptomatic, describe symptoms. ---CALLER STATES THAT HE STARTED ABOUT 3-4 WEEKS AGO IT STARTED OUT AS A COLD. HE HAS A COUGH THAT WOULD NOT GO AWAY. HE STARTED WITH MUSCLE ACHES YESTERDAY FOR THE PAST 2 DAYS. TEMP IS 99. DOES HAVE CHILLS. CONGESTED COUGH IS TAKING MUCINEX DM. HE IS GETTING SOME GREEN MUCOUS UP. HE IS ALSO GETTING SOME OUT OF THE NOSE. HE IS HAVING SOME CHEST SORENESS FROM COUGHING. HE IS A DIABETIC AND ON A LOT OF MEDICATIONS. HE DOES HAVE SOME DIABETIC COUGH MEDICATION HE IS TAKING. Has the patient traveled out of the country within the last 30 days? ---Not Applicable Does the patient require triage? ---Yes Related visit to physician within the last 2 weeks? ---No Does the PT have any chronic conditions? (i.e. diabetes, asthma, etc.) ---Yes List chronic conditions. ---DIABETES, NEUROPATHY, HTN, Guidelines Guideline Title Affirmed Question Affirmed Notes Cough - Acute Productive [1] Continuous (nonstop) coughing interferes with work or school AND [2] no improvement using cough treatment per Care Advice Final Disposition User See Physician within [place], Therapist, sports, Amy Comments CALLER WAS [place] ENCOURAGED TO [place]. HE STATES THAT HE DID NOT WANT TO HAVE TO MAKE AN APPT TO BE SEEN. HE HAS AN APPT SCHEDULED IN [facility] AND DOES NOT WANT TO SEE THE MD UNTIL THEN. ENCOURAGED HIM THAT DUE TO HIS SYMPTOMS THAT HE REALLY NEEDS TO BE ASSESSED BY THE MD. HE STATES THAT HE WANTS TO JUST WAIT AND SEE HOW HE FEELS BEFORE HE  MAKES AN APPT. CALLED THE OFFICE AND TRIED TO SPEAK WITH THE TRIAGE NURSE. LEFT A MESSAGE FOR HER AND GAVE THE INFORMATION THAT HE IS JUST WANTING TO HAVE PLEASE NOTE: All timestamps contained within this report are represented as [place] Standard Time. CONFIDENTIALTY NOTICE: This fax transmission is intended only for the addressee. It contains information that is legally privileged, confidential or otherwise protected from use or disclosure. If you are not the intended recipient, you are strictly prohibited from reviewing, disclosing, copying using or disseminating any of this information or taking any action in reliance on or regarding this information. If you have received this fax in error, please notify us immediately by telephone so that we can arrange for its return to [place]. Phone: [phone], Toll-Free: [phone], Fax: [phone] Page: 2 of 2 Call Id: [phone] Comments SOMETHING CALLED IN FOR THESE SYMPTOMS. HE DOES NOT WANT TO HAVE TO COME IN. LEFT THE TRIAGE NURSE INFORMATION AT THE CALL CENTER FOR HER TO CALL BACK AND ADVISE WHAT THEY WANT THE CALLER TO DO. WILL PLACE IN P-QUEUE FOR AND AWAIT CALL BACK FROM THE OFFICE. CALLER IS AWARE WE ARE WAITING FOR FURTHER DIRECTION FROM THE OFFICE.

## 2015-02-10 ENCOUNTER — Other Ambulatory Visit (INDEPENDENT_AMBULATORY_CARE_PROVIDER_SITE_OTHER): Payer: PRIVATE HEALTH INSURANCE

## 2015-02-10 DIAGNOSIS — E119 Type 2 diabetes mellitus without complications: Secondary | ICD-10-CM | POA: Diagnosis not present

## 2015-02-10 LAB — HEMOGLOBIN A1C: Hgb A1c MFr Bld: 7.1 % — ABNORMAL HIGH (ref 4.6–6.5)

## 2015-02-13 ENCOUNTER — Encounter: Payer: Self-pay | Admitting: Family Medicine

## 2015-02-13 ENCOUNTER — Ambulatory Visit (INDEPENDENT_AMBULATORY_CARE_PROVIDER_SITE_OTHER): Payer: PRIVATE HEALTH INSURANCE | Admitting: Family Medicine

## 2015-02-13 VITALS — BP 122/68 | HR 62 | Temp 98.2°F | Wt 200.5 lb

## 2015-02-13 DIAGNOSIS — R5383 Other fatigue: Secondary | ICD-10-CM

## 2015-02-13 DIAGNOSIS — E119 Type 2 diabetes mellitus without complications: Secondary | ICD-10-CM

## 2015-02-13 DIAGNOSIS — I10 Essential (primary) hypertension: Secondary | ICD-10-CM | POA: Diagnosis not present

## 2015-02-13 DIAGNOSIS — N529 Male erectile dysfunction, unspecified: Secondary | ICD-10-CM

## 2015-02-13 DIAGNOSIS — Z125 Encounter for screening for malignant neoplasm of prostate: Secondary | ICD-10-CM | POA: Diagnosis not present

## 2015-02-13 DIAGNOSIS — E1140 Type 2 diabetes mellitus with diabetic neuropathy, unspecified: Secondary | ICD-10-CM

## 2015-02-13 DIAGNOSIS — E1149 Type 2 diabetes mellitus with other diabetic neurological complication: Secondary | ICD-10-CM

## 2015-02-13 MED ORDER — GLIMEPIRIDE 2 MG PO TABS: 2.0000 mg | ORAL_TABLET | Freq: Every day | ORAL | Status: DC

## 2015-02-13 MED ORDER — LOSARTAN POTASSIUM 50 MG PO TABS: 50.0000 mg | ORAL_TABLET | Freq: Every day | ORAL | Status: DC

## 2015-02-13 MED ORDER — ETODOLAC 500 MG PO TABS: 500.0000 mg | ORAL_TABLET | Freq: Two times a day (BID) | ORAL | Status: DC

## 2015-02-13 MED ORDER — METFORMIN HCL 500 MG PO TABS: 500.0000 mg | ORAL_TABLET | Freq: Two times a day (BID) | ORAL | Status: DC

## 2015-02-13 MED ORDER — OMEPRAZOLE 20 MG PO CPDR: 20.0000 mg | DELAYED_RELEASE_CAPSULE | Freq: Two times a day (BID) | ORAL | Status: DC

## 2015-02-13 MED ORDER — PIOGLITAZONE HCL 30 MG PO TABS: 30.0000 mg | ORAL_TABLET | Freq: Every day | ORAL | Status: DC

## 2015-02-13 MED ORDER — VARDENAFIL HCL 10 MG PO TABS: 10.0000 mg | ORAL_TABLET | Freq: Every day | ORAL | Status: DC | PRN

## 2015-02-13 MED ORDER — ROSUVASTATIN CALCIUM 10 MG PO TABS: 10.0000 mg | ORAL_TABLET | Freq: Every evening | ORAL | Status: DC

## 2015-02-13 MED ORDER — PREGABALIN 75 MG PO CAPS: 75.0000 mg | ORAL_CAPSULE | Freq: Two times a day (BID) | ORAL | Status: DC

## 2015-02-13 NOTE — Progress Notes (Signed)
Pre visit review using our clinic review tool, if applicable. No additional management support is needed unless otherwise documented below in the visit note.  Diabetes:  Using medications without difficulties:yes Hypoglycemic episodes:no Hyperglycemic episodes:no Feet problems: at baseline, some relief with lyrica.   Blood Sugars averaging: usually 125-150, recently closer to 150 since knee surgery and less active.  He is trying to get back to his exercise routine.   eye exam within last year: yes, last month per patient D/w pt about recheck in 4 months.    ED.  Needed change in med back to levitra.  That worked better than sildenafil.    Meds, vitals, and allergies reviewed.   ROS: See HPI.  Otherwise negative.    GEN: nad, alert and oriented HEENT: mucous membranes moist NECK: supple w/o LA CV: rrr. PULM: ctab, no inc wob ABD: soft, +bs EXT: no edema SKIN: no acute rash  Diabetic foot exam: Normal inspection No skin breakdown No calluses  Normal DP pulses Dec sensation to light touch and monofilament Nails normal

## 2015-02-13 NOTE — Patient Instructions (Addendum)
Schedule the next visit in about 4 months as a physical, labs ahead of time.   Take care.  Glad to see you.

## 2015-02-14 NOTE — Assessment & Plan Note (Signed)
Will likely improve as his activity gets back to baseline.  Slight inc in A1c, not enough to change meds.  He agrees. Continue meds as is.  >25 minutes spent in face to face time with patient, >50% spent in counselling or coordination of care. D/w pt about recheck in 4 months.

## 2015-02-14 NOTE — Assessment & Plan Note (Signed)
Okay to change back to levitra. Routine cautions given.

## 2015-06-11 ENCOUNTER — Other Ambulatory Visit (INDEPENDENT_AMBULATORY_CARE_PROVIDER_SITE_OTHER): Payer: PRIVATE HEALTH INSURANCE

## 2015-06-11 DIAGNOSIS — R5383 Other fatigue: Secondary | ICD-10-CM | POA: Diagnosis not present

## 2015-06-11 DIAGNOSIS — Z125 Encounter for screening for malignant neoplasm of prostate: Secondary | ICD-10-CM

## 2015-06-11 DIAGNOSIS — E119 Type 2 diabetes mellitus without complications: Secondary | ICD-10-CM

## 2015-06-11 LAB — COMPREHENSIVE METABOLIC PANEL
ALK PHOS: 46 U/L (ref 39–117)
ALT: 31 U/L (ref 0–53)
AST: 23 U/L (ref 0–37)
Albumin: 4.3 g/dL (ref 3.5–5.2)
BUN: 17 mg/dL (ref 6–23)
CO2: 30 mEq/L (ref 19–32)
CREATININE: 0.87 mg/dL (ref 0.40–1.50)
Calcium: 9.4 mg/dL (ref 8.4–10.5)
Chloride: 101 mEq/L (ref 96–112)
GFR: 94.87 mL/min (ref >60.00)
GLUCOSE: 134 mg/dL — AB (ref 70–99)
Potassium: 4.7 mEq/L (ref 3.5–5.1)
Sodium: 139 mEq/L (ref 135–145)
Total Bilirubin: 1 mg/dL (ref 0.2–1.2)
Total Protein: 7.5 g/dL (ref 6.0–8.3)

## 2015-06-11 LAB — LIPID PANEL
Cholesterol: 131 mg/dL (ref 0–200)
HDL: 50.2 mg/dL (ref >39.00)
LDL Cholesterol: 67 mg/dL (ref 0–99)
NonHDL: 80.8
Total CHOL/HDL Ratio: 3
Triglycerides: 71 mg/dL (ref 0.0–149.0)
VLDL: 14.2 mg/dL (ref 0.0–40.0)

## 2015-06-11 LAB — VITAMIN B12: VITAMIN B 12: 515 pg/mL (ref 211–911)

## 2015-06-11 LAB — PSA: PSA: 0.34 ng/mL (ref 0.10–4.00)

## 2015-06-11 LAB — HEMOGLOBIN A1C: HEMOGLOBIN A1C: 6.6 % — AB (ref 4.6–6.5)

## 2015-06-17 ENCOUNTER — Ambulatory Visit (INDEPENDENT_AMBULATORY_CARE_PROVIDER_SITE_OTHER): Payer: PRIVATE HEALTH INSURANCE | Admitting: Family Medicine

## 2015-06-17 ENCOUNTER — Encounter: Payer: Self-pay | Admitting: Family Medicine

## 2015-06-17 VITALS — BP 116/64 | HR 64 | Temp 98.4°F | Ht 72.0 in | Wt 192.8 lb

## 2015-06-17 DIAGNOSIS — E1140 Type 2 diabetes mellitus with diabetic neuropathy, unspecified: Secondary | ICD-10-CM

## 2015-06-17 DIAGNOSIS — Z0000 Encounter for general adult medical examination without abnormal findings: Secondary | ICD-10-CM

## 2015-06-17 DIAGNOSIS — E1149 Type 2 diabetes mellitus with other diabetic neurological complication: Secondary | ICD-10-CM

## 2015-06-17 DIAGNOSIS — E785 Hyperlipidemia, unspecified: Secondary | ICD-10-CM | POA: Diagnosis not present

## 2015-06-17 MED ORDER — PREGABALIN 75 MG PO CAPS: 75.0000 mg | ORAL_CAPSULE | Freq: Two times a day (BID) | ORAL | Status: DC

## 2015-06-17 NOTE — Patient Instructions (Signed)
Recheck labs before a visit in about 4 months.  I would get a flu shot each fall.   Take care.  Glad to see you.

## 2015-06-17 NOTE — Progress Notes (Signed)
Pre visit review using our clinic review tool, if applicable. No additional management support is needed unless otherwise documented below in the visit note.  I have personally reviewed the Medicare Annual Wellness questionnaire and have noted 1. The patient's medical and social history 2. Their use of alcohol, tobacco or illicit drugs 3. Their current medications and supplements 4. The patient's functional ability including ADL's, fall risks, home safety risks and hearing or visual             impairment. 5. Diet and physical activities 6. Evidence for depression or mood disorders  The patients weight, height, BMI have been recorded in the chart and visual acuity is per eye clinic.  I have made referrals, counseling and provided education to the patient based review of the above and I have provided the pt with a written personalized care plan for preventive services.  Provider list updated- see scanned forms.  Routine anticipatory guidance given to patient.  See health maintenance.  Flu 2015 Shingles 2015 PNA 2015 Tetanus 2007 Colonoscopy 2009 Prostate cancer screening 2016 Cognitive function addressed- see scanned forms- and if abnormal then additional documentation follows.   DM2.  No ADE on meds.  No low.  No highs. Usually 130-140 in the AMs.  Foot pain- burning- at baseline, maybe a little better with alpha linoleic acid.  He didn't need to inc his lyrica in the meantime.   HLD.  Cramping occ noted.  Better with crestor than any other statin, is manageable, he can tolerate.  Working on diet.  Exercising as tolerated.    PMH and SH reviewed  Meds, vitals, and allergies reviewed.   ROS: See HPI.  Otherwise negative.    GEN: nad, alert and oriented HEENT: mucous membranes moist NECK: supple w/o LA CV: rrr. PULM: ctab, no inc wob ABD: soft, +bs EXT: no edema SKIN: no acute rash  Diabetic foot exam: Normal inspection No skin breakdown No calluses  Normal DP  pulses Normal sensation to light touch and monofilament Nails normal

## 2015-06-19 DIAGNOSIS — Z0000 Encounter for general adult medical examination without abnormal findings: Secondary | ICD-10-CM | POA: Insufficient documentation

## 2015-06-19 NOTE — Assessment & Plan Note (Signed)
Flu 2015 Shingles 2015 PNA 2015 Tetanus 2007 Colonoscopy 2009 Prostate cancer screening 2016 Cognitive function addressed- see scanned forms- and if abnormal then additional documentation follows.

## 2015-06-19 NOTE — Assessment & Plan Note (Signed)
A1c improved, no change in meds.  We didn't need to up his lyrica at his point, he can tolerate the discomfort at is.  Recheck A1c in about 4 months.  D/w pt about diet and exercise.  He agrees with plan.

## 2015-06-19 NOTE — Assessment & Plan Note (Signed)
Cramps maybe some worse with statin, but is manageable for now.  Lipids at goal.  Would continue as is for now.  He agrees.

## 2015-08-22 ENCOUNTER — Telehealth: Payer: Self-pay

## 2015-08-22 MED ORDER — GLUCOSE BLOOD VI STRP: ORAL_STRIP | Status: DC

## 2015-08-22 NOTE — Telephone Encounter (Signed)
Dr Damita Dunnings had given pt rx for diabetic test strips; [facility] advised pt had to have name of test strips on rx. Pt request Relion confirm plus micro test strips. Advised pt will send electronically and pt will ck with pharmacy later.

## 2015-10-15 ENCOUNTER — Other Ambulatory Visit: Payer: Self-pay | Admitting: Family Medicine

## 2015-10-15 ENCOUNTER — Other Ambulatory Visit (INDEPENDENT_AMBULATORY_CARE_PROVIDER_SITE_OTHER): Payer: PRIVATE HEALTH INSURANCE

## 2015-10-15 DIAGNOSIS — Z125 Encounter for screening for malignant neoplasm of prostate: Secondary | ICD-10-CM

## 2015-10-15 DIAGNOSIS — E119 Type 2 diabetes mellitus without complications: Secondary | ICD-10-CM | POA: Diagnosis not present

## 2015-10-15 LAB — HEMOGLOBIN A1C: Hgb A1c MFr Bld: 6.4 % (ref 4.6–6.5)

## 2015-10-20 ENCOUNTER — Encounter: Payer: Self-pay | Admitting: Family Medicine

## 2015-10-20 ENCOUNTER — Ambulatory Visit (INDEPENDENT_AMBULATORY_CARE_PROVIDER_SITE_OTHER): Payer: PRIVATE HEALTH INSURANCE | Admitting: Family Medicine

## 2015-10-20 VITALS — BP 120/60 | HR 60 | Temp 98.4°F | Wt 192.5 lb

## 2015-10-20 DIAGNOSIS — E785 Hyperlipidemia, unspecified: Secondary | ICD-10-CM

## 2015-10-20 DIAGNOSIS — E1149 Type 2 diabetes mellitus with other diabetic neurological complication: Secondary | ICD-10-CM

## 2015-10-20 DIAGNOSIS — Z23 Encounter for immunization: Secondary | ICD-10-CM

## 2015-10-20 DIAGNOSIS — I10 Essential (primary) hypertension: Secondary | ICD-10-CM

## 2015-10-20 MED ORDER — VARDENAFIL HCL 10 MG PO TABS: 10.0000 mg | ORAL_TABLET | Freq: Every day | ORAL | Status: DC | PRN

## 2015-10-20 MED ORDER — GLIMEPIRIDE 2 MG PO TABS: 2.0000 mg | ORAL_TABLET | Freq: Every day | ORAL | Status: DC

## 2015-10-20 MED ORDER — PREGABALIN 75 MG PO CAPS: 75.0000 mg | ORAL_CAPSULE | Freq: Two times a day (BID) | ORAL | Status: DC

## 2015-10-20 MED ORDER — ROSUVASTATIN CALCIUM 10 MG PO TABS: ORAL_TABLET | ORAL | Status: DC

## 2015-10-20 MED ORDER — OMEPRAZOLE 20 MG PO CPDR: 20.0000 mg | DELAYED_RELEASE_CAPSULE | Freq: Two times a day (BID) | ORAL | Status: DC

## 2015-10-20 MED ORDER — GLUCOSE BLOOD VI STRP: ORAL_STRIP | Status: DC

## 2015-10-20 MED ORDER — ETODOLAC 500 MG PO TABS: 500.0000 mg | ORAL_TABLET | Freq: Two times a day (BID) | ORAL | Status: DC

## 2015-10-20 MED ORDER — METFORMIN HCL 500 MG PO TABS: 500.0000 mg | ORAL_TABLET | Freq: Two times a day (BID) | ORAL | Status: DC

## 2015-10-20 MED ORDER — PIOGLITAZONE HCL 30 MG PO TABS: 30.0000 mg | ORAL_TABLET | Freq: Every day | ORAL | Status: DC

## 2015-10-20 MED ORDER — LOSARTAN POTASSIUM 50 MG PO TABS: 50.0000 mg | ORAL_TABLET | Freq: Every day | ORAL | Status: DC

## 2015-10-20 NOTE — Progress Notes (Signed)
Pre visit review using our clinic review tool, if applicable. No additional management support is needed unless otherwise documented below in the visit note.  HLD.  More cramps on statin, was able to tolerate dosing 4x/week.  Cramps are better with current dosing.  D/w pt.    Diabetes:  Using medications without difficulties:yes Hypoglycemic episodes:no Hyperglycemic episodes:no Feet problems: pain/neuropathy at baseline, continues.     Blood Sugars averaging: ~130 in the AMs.   eye exam within last year:yes A1c improved. D/w pt.   He isn't at the point of wanting to inc his lyrica yet.  D/w pt.  He wants to limit possible sedation from the higher dose.    He needs his disability forms done.  He dropped off a copy.   Out of work due to neuropathy.   Needs reprints of rxs since he is changing pharmacy.   PMH and SH reviewed  Meds, vitals, and allergies reviewed.   ROS: See HPI.  Otherwise negative.    GEN: nad, alert and oriented HEENT: mucous membranes moist NECK: supple w/o LA CV: rrr. PULM: ctab, no inc wob ABD: soft, +bs EXT: no edema SKIN: no acute rash  Diabetic foot exam: Normal inspection No skin breakdown No calluses  Normal DP pulses Minimal sensation to light touch and monofilament on the feet.  (diminished on the hands, but clearly worse on the feet) Nails normal

## 2015-10-20 NOTE — Patient Instructions (Signed)
Recheck A1c in about 4 months before a visit.   Take care.  Glad to see you. Don't change your meds for now.

## 2015-10-21 ENCOUNTER — Encounter: Payer: Self-pay | Admitting: Family Medicine

## 2015-10-21 NOTE — Assessment & Plan Note (Signed)
With longstanding disability (3+ years) from neuropathy.  Would continue is as.  He didn't want to inc lyrica at this point and risk ADE on med (ie possible sedation, etc), okay to continue as is for now.  D/w pt.  A1c at goal, recheck in a few months.  He agrees.  >25 minutes spent in face to face time with patient, >50% spent in counselling or coordination of care.

## 2015-10-21 NOTE — Assessment & Plan Note (Signed)
Cramping improved on 4x/week dosing of statin. This appears to be his max tolerated dose.  Would continue as is.

## 2015-10-28 DIAGNOSIS — L570 Actinic keratosis: Secondary | ICD-10-CM | POA: Diagnosis not present

## 2015-10-28 DIAGNOSIS — D1801 Hemangioma of skin and subcutaneous tissue: Secondary | ICD-10-CM | POA: Diagnosis not present

## 2015-10-28 DIAGNOSIS — Z85828 Personal history of other malignant neoplasm of skin: Secondary | ICD-10-CM | POA: Diagnosis not present

## 2015-10-28 DIAGNOSIS — D2272 Melanocytic nevi of left lower limb, including hip: Secondary | ICD-10-CM | POA: Diagnosis not present

## 2015-10-28 DIAGNOSIS — D2371 Other benign neoplasm of skin of right lower limb, including hip: Secondary | ICD-10-CM | POA: Diagnosis not present

## 2015-10-28 DIAGNOSIS — D2271 Melanocytic nevi of right lower limb, including hip: Secondary | ICD-10-CM | POA: Diagnosis not present

## 2015-10-28 DIAGNOSIS — D225 Melanocytic nevi of trunk: Secondary | ICD-10-CM | POA: Diagnosis not present

## 2015-10-28 DIAGNOSIS — L821 Other seborrheic keratosis: Secondary | ICD-10-CM | POA: Diagnosis not present

## 2015-10-28 DIAGNOSIS — L812 Freckles: Secondary | ICD-10-CM | POA: Diagnosis not present

## 2016-01-29 LAB — HM DIABETES EYE EXAM

## 2016-02-06 ENCOUNTER — Encounter: Payer: Self-pay | Admitting: Family Medicine

## 2016-02-13 ENCOUNTER — Other Ambulatory Visit (INDEPENDENT_AMBULATORY_CARE_PROVIDER_SITE_OTHER): Payer: PRIVATE HEALTH INSURANCE

## 2016-02-13 DIAGNOSIS — E1149 Type 2 diabetes mellitus with other diabetic neurological complication: Secondary | ICD-10-CM

## 2016-02-13 LAB — HEMOGLOBIN A1C: HEMOGLOBIN A1C: 6.4 % (ref 4.6–6.5)

## 2016-02-19 ENCOUNTER — Ambulatory Visit (INDEPENDENT_AMBULATORY_CARE_PROVIDER_SITE_OTHER): Payer: PRIVATE HEALTH INSURANCE | Admitting: Family Medicine

## 2016-02-19 ENCOUNTER — Encounter: Payer: Self-pay | Admitting: Family Medicine

## 2016-02-19 VITALS — BP 122/62 | HR 58 | Temp 97.8°F | Wt 193.8 lb

## 2016-02-19 DIAGNOSIS — Z119 Encounter for screening for infectious and parasitic diseases, unspecified: Secondary | ICD-10-CM

## 2016-02-19 DIAGNOSIS — E1149 Type 2 diabetes mellitus with other diabetic neurological complication: Secondary | ICD-10-CM | POA: Diagnosis not present

## 2016-02-19 DIAGNOSIS — Z125 Encounter for screening for malignant neoplasm of prostate: Secondary | ICD-10-CM | POA: Diagnosis not present

## 2016-02-19 DIAGNOSIS — E1140 Type 2 diabetes mellitus with diabetic neuropathy, unspecified: Secondary | ICD-10-CM

## 2016-02-19 NOTE — Patient Instructions (Signed)
Recheck labs before a physical this summer.  Take care.  Glad to see you.  Keep working on your diet and exercise as best you can.

## 2016-02-19 NOTE — Progress Notes (Signed)
Pre visit review using our clinic review tool, if applicable. No additional management support is needed unless otherwise documented below in the visit note.  Diabetes:  Using medications without difficulties:yes Hypoglycemic episodes:no Hyperglycemic episodes:no Feet problems: see below Blood Sugars averaging: ~120-140 eye exam within last year:yes He has been able to tolerate current dose of statin with some cramping but can continue as is.   Able to tolerate pain from neuropathy with lyrica dose as is.   A1c d/w pt.   Pt opts in for HCV and HIV screening.  D/w pt re: routine screening.    Meds, vitals, and allergies reviewed.   ROS: See HPI.  Otherwise negative.    GEN: nad, alert and oriented NECK: supple w/o LA CV: rrr. PULM: ctab, no inc wob ABD: soft, +bs EXT: no edema  Diabetic foot exam: Normal inspection No skin breakdown No calluses  Normal DP pulses Normal sensation to light touch and monofilament (he still has oversensitive feet, ie from neuropathy) Nails normal

## 2016-02-23 NOTE — Assessment & Plan Note (Signed)
He has been able to tolerate current dose of statin with some cramping but can continue as is.   Able to tolerate pain from neuropathy with lyrica dose as is.   A1c d/w pt.   Recheck labs with next OV.  He agrees.   No change in meds.

## 2016-05-13 ENCOUNTER — Other Ambulatory Visit: Payer: Self-pay | Admitting: Family Medicine

## 2016-05-13 NOTE — Telephone Encounter (Signed)
Electronic refill request. Last Filled:    180 capsule 1 10/20/2015  Last office visit:   Labs and CPE scheduled mid July 2017.  Please advise.

## 2016-05-14 DIAGNOSIS — J069 Acute upper respiratory infection, unspecified: Secondary | ICD-10-CM | POA: Diagnosis not present

## 2016-05-14 NOTE — Telephone Encounter (Signed)
Medication phoned to pharmacy.  

## 2016-05-14 NOTE — Telephone Encounter (Signed)
Please call in.  Thanks.   

## 2016-06-17 ENCOUNTER — Other Ambulatory Visit (INDEPENDENT_AMBULATORY_CARE_PROVIDER_SITE_OTHER): Payer: PRIVATE HEALTH INSURANCE

## 2016-06-17 DIAGNOSIS — Z125 Encounter for screening for malignant neoplasm of prostate: Secondary | ICD-10-CM

## 2016-06-17 DIAGNOSIS — E1140 Type 2 diabetes mellitus with diabetic neuropathy, unspecified: Secondary | ICD-10-CM | POA: Diagnosis not present

## 2016-06-17 DIAGNOSIS — Z119 Encounter for screening for infectious and parasitic diseases, unspecified: Secondary | ICD-10-CM

## 2016-06-17 LAB — COMPREHENSIVE METABOLIC PANEL
ALK PHOS: 38 U/L — AB (ref 39–117)
ALT: 26 U/L (ref 0–53)
AST: 24 U/L (ref 0–37)
Albumin: 4.3 g/dL (ref 3.5–5.2)
BUN: 18 mg/dL (ref 6–23)
CHLORIDE: 103 meq/L (ref 96–112)
CO2: 31 mEq/L (ref 19–32)
Calcium: 9.2 mg/dL (ref 8.4–10.5)
Creatinine, Ser: 0.9 mg/dL (ref 0.40–1.50)
GFR: 90.92 mL/min (ref >60.00)
GLUCOSE: 137 mg/dL — AB (ref 70–99)
POTASSIUM: 4.6 meq/L (ref 3.5–5.1)
Sodium: 137 mEq/L (ref 135–145)
TOTAL PROTEIN: 7.2 g/dL (ref 6.0–8.3)
Total Bilirubin: 1 mg/dL (ref 0.2–1.2)

## 2016-06-17 LAB — LIPID PANEL
Cholesterol: 157 mg/dL (ref 0–200)
HDL: 50.7 mg/dL (ref >39.00)
LDL CALC: 90 mg/dL (ref 0–99)
NONHDL: 106.04
Total CHOL/HDL Ratio: 3
Triglycerides: 80 mg/dL (ref 0.0–149.0)
VLDL: 16 mg/dL (ref 0.0–40.0)

## 2016-06-17 LAB — PSA, MEDICARE: PSA: 0.37 ng/ml (ref 0.10–4.00)

## 2016-06-17 LAB — HEMOGLOBIN A1C: Hgb A1c MFr Bld: 6.2 % (ref 4.6–6.5)

## 2016-06-17 NOTE — Addendum Note (Signed)
Addended by: Marchia Bond on: 06/17/2016 09:07 AM   Modules accepted: Orders

## 2016-06-18 LAB — HIV ANTIBODY (ROUTINE TESTING W REFLEX): HIV 1&2 Ab, 4th Generation: NONREACTIVE

## 2016-06-18 LAB — HEPATITIS C ANTIBODY: HCV Ab: NEGATIVE

## 2016-06-21 ENCOUNTER — Encounter: Payer: Medicare Other | Admitting: Family Medicine

## 2016-06-22 ENCOUNTER — Ambulatory Visit (INDEPENDENT_AMBULATORY_CARE_PROVIDER_SITE_OTHER): Payer: PRIVATE HEALTH INSURANCE

## 2016-06-22 ENCOUNTER — Ambulatory Visit (INDEPENDENT_AMBULATORY_CARE_PROVIDER_SITE_OTHER): Payer: PRIVATE HEALTH INSURANCE | Admitting: Family Medicine

## 2016-06-22 ENCOUNTER — Encounter: Payer: Self-pay | Admitting: Family Medicine

## 2016-06-22 VITALS — BP 130/60 | HR 60 | Temp 98.0°F | Ht 70.0 in | Wt 190.2 lb

## 2016-06-22 DIAGNOSIS — Z7189 Other specified counseling: Secondary | ICD-10-CM | POA: Diagnosis not present

## 2016-06-22 DIAGNOSIS — Z0000 Encounter for general adult medical examination without abnormal findings: Secondary | ICD-10-CM | POA: Diagnosis not present

## 2016-06-22 DIAGNOSIS — E1149 Type 2 diabetes mellitus with other diabetic neurological complication: Secondary | ICD-10-CM | POA: Diagnosis not present

## 2016-06-22 DIAGNOSIS — E785 Hyperlipidemia, unspecified: Secondary | ICD-10-CM

## 2016-06-22 MED ORDER — ROSUVASTATIN CALCIUM 10 MG PO TABS
ORAL_TABLET | ORAL | Status: DC
Start: 2016-06-22 — End: 2016-10-26

## 2016-06-22 NOTE — Progress Notes (Signed)
Pre visit review using our clinic review tool, if applicable. No additional management support is needed unless otherwise documented below in the visit note.  Diabetes:  Using medications without difficulties: yes Hypoglycemic episodes:no Hyperglycemic episodes:no Feet problems:at baseline.   Blood Sugars averaging: ~120-140 usually, occ lower.   eye exam within last year:yes A1c d/w pt.  Neuropathy at baseline.  On lyrica BID and he didn't want to inc at this point.  D/w pt.    HCV neg. D/ wpt.   Advance directive d/w pt.   Would have wife designated if patient were incapacitated.    LDL up slightly.  Taking statin 4 days a week.  Able to tolerate current dosing.  D/w pt about trying 5 days per week dosing, would be reasonable to try, d/w pt.   PSA wnl.  D/ wpt.    Tetanus deferred.  He'll check on coverage.    Hearing screening d/w pt.  Declined hearing aids.    PMH and SH reviewed  Meds, vitals, and allergies reviewed.   ROS: Per HPI unless specifically indicated in ROS section   GEN: nad, alert and oriented HEENT: mucous membranes moist NECK: supple w/o LA CV: rrr. PULM: ctab, no inc wob ABD: soft, +bs EXT: no edema SKIN: no acute rash  Diabetic foot exam: Normal inspection- healed blister between R 3rd and 4th toes with postinflammatory hyperpigmentation noted.   No skin breakdown No calluses  Normal DP pulses Normal sensation to light touch but dec to monofilament B Nails normal

## 2016-06-22 NOTE — Progress Notes (Signed)
Subjective:   Ian Johnson is a 60 y.o. male who presents for Medicare Annual/Subsequent preventive examination.  Review of Systems:  N/A Cardiac Risk Factors include: advanced age (>41men, >52 women);diabetes mellitus;male gender;dyslipidemia;hypertension     Objective:    Vitals: BP 130/60 mmHg  Pulse 60  Temp(Src) 98 F (36.7 C) (Oral)  Ht 5\' 10"  (1.778 m)  Wt 190 lb 4 oz (86.297 kg)  BMI 27.30 kg/m2  SpO2 97%  Body mass index is 27.3 kg/(m^2).  Tobacco History  Smoking status  . Never Smoker   Smokeless tobacco  . Never Used     Counseling given: No   Past Medical History  Diagnosis Date  . Personal history of urinary calculi   . Nonspecific elevation of levels of transaminase or lactic acid dehydrogenase (LDH)   . Backache, unspecified   . Other and unspecified hyperlipidemia   . Esophageal reflux   . Unspecified essential hypertension     no meds now  . Peripheral neuropathy ([place])   . Arthritis   . 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 Wears glasses   . Complex tear of medial meniscus of left knee as current injury 01/19/2013  . Adhesive capsulitis of right shoulder 11/09/2013  . Diabetes mellitus without mention of complication    Past Surgical History  Procedure Laterality Date  . Lumbar disc surgery  1990    l4 and l5  . Eye surgery  [phone]    multiple lt eye reconstruction surg -glass eye  . Colonoscopy    . Upper gi endoscopy    . Knee arthroscopy with medial menisectomy Left 01/19/2013    Procedure: KNEE ARTHROSCOPY WITH MEDIAL MENISECTOMY;  Surgeon: Johnny Bridge, MD;  Location: [place];  Service: Orthopedics;  Laterality: Left;  LEFT KNEE SCOPE WITH DEBRIDEMENT AND partial MEDIAL MENISECTOMY  . Chondroplasty Left 01/19/2013    Procedure: CHONDROPLASTY;  Surgeon: Johnny Bridge, MD;  Location: [place];  Service: Orthopedics;  Laterality: Left;  ARTHROSCOPY KNEE WITH DEBRIDEMENT/SHAVING  (CHONDROPLASTY)  . Shoulder arthroscopy with rotator cuff repair and subacromial decompression Right 11/09/2013    Procedure: RIGHT SHOULDER ARTHROSCOPY WITH DEBRIDEMENT OF PARTIAL ROTATOR CUFF TEAR AND SUBACROMIAL DECOMPRESSION ;  Surgeon: Johnny Bridge, MD;  Location: [place];  Service: Orthopedics;  Laterality: Right;  . Knee arthroscopy w/ meniscal repair Right     2016   Family History  Problem Relation Age of Onset  . Coronary artery disease Mother   . Coronary artery disease Father   . Peripheral vascular disease Father   . Prostate cancer Father   . Colon cancer Neg Hx    History  Sexual Activity  . Sexual Activity:  . Partners: Female    Outpatient Encounter Prescriptions as of 06/22/2016  Medication Sig  . Alpha Lipoic Acid 200 MG CAPS Take 200 mg by mouth daily.  . Ascorbic Acid (VITAMIN C) 500 MG CAPS Take 1 each by mouth daily.  Marland Kitchen aspirin 81 MG EC tablet Take 81 mg by mouth daily.    . Coenzyme Q10 (CO Q-10) 100 MG CAPS Take 200 mg by mouth daily.    Marland Kitchen etodolac (LODINE) 500 MG tablet Take 1 tablet (500 mg total) by mouth 2 (two) times daily.  . fish oil-omega-3 fatty acids 1000 MG capsule Take 2 g by mouth daily. 1200 mg daily.  . Ginkgo Biloba 60 MG CAPS Take 1 each by  mouth daily.  Marland Kitchen glimepiride (AMARYL) 2 MG tablet Take 1 tablet (2 mg total) by mouth daily.  Marland Kitchen glucose blood (RELION CONFIRM/MICRO TEST) test strip Check blood sugar daily and as directed. Dx E11.9  . losartan (COZAAR) 50 MG tablet Take 1 tablet (50 mg total) by mouth daily.  Marland Kitchen LYRICA 75 MG capsule TAKE 1 CAPSULE BY MOUTH 2 TIMES DAILY  . metFORMIN (GLUCOPHAGE) 500 MG tablet Take 1 tablet (500 mg total) by mouth 2 (two) times daily with a meal.  . Multiple Vitamin (MULTIVITAMIN) tablet Take 1 tablet by mouth daily.    Marland Kitchen omeprazole (PRILOSEC) 20 MG capsule Take 1 capsule (20 mg total) by mouth 2 (two) times daily.  . pioglitazone (ACTOS) 30 MG tablet Take 1 tablet (30 mg total) by  mouth daily.  . rosuvastatin (CRESTOR) 10 MG tablet Take 4 times a week.  . vardenafil (LEVITRA) 10 MG tablet Take 1 tablet (10 mg total) by mouth daily as needed for erectile dysfunction.   No facility-administered encounter medications on file as of 06/22/2016.    Activities of Daily Living In your present state of health, do you have any difficulty performing the following activities: 06/22/2016  Hearing? N  Vision? Y  Difficulty concentrating or making decisions? N  Walking or climbing stairs? N  Dressing or bathing? N  Doing errands, shopping? N  Preparing Food and eating ? N  Using the Toilet? N  In the past six months, have you accidently leaked urine? N  Do you have problems with loss of bowel control? N  Managing your Medications? N  Managing your Finances? N  Housekeeping or managing your Housekeeping? N    Patient Care Team: Tonia Ghent, MD as PCP - General (Family Medicine) Lyla Glassing, MD as Referring Physician (Ophthalmology) Marchia Bond, MD as Consulting Physician (Orthopedic Surgery) Jarome Matin, MD as Consulting Physician (Dermatology) Solon Augusta, MD as Attending Physician (Ophthalmology) Laurence Spates, MD as Consulting Physician (Gastroenterology)   Assessment:     Hearing Screening   125Hz  250Hz  500Hz  1000Hz  2000Hz  4000Hz  8000Hz   Right ear:   40 40 40 0   Left ear:   40 40 40 0   Vision Screening Comments: Last vision exam in Nov 2016 @ [facility]   Exercise Activities and Dietary recommendations Current Exercise Habits: Home exercise routine, Type of exercise: strength training/weights;Other - see comments (recumbent bike), Time (Minutes): 60, Frequency (Times/Week): 3, Weekly Exercise (Minutes/Week): 180, Intensity: Moderate, Exercise limited by: None identified  Goals    . Increase physical activity     Starting 06/22/16, I will continue to exercise for at least 60 min 3 days per week.       Fall Risk Fall Risk  06/22/2016  06/22/2016 06/17/2015  Falls in the past year? No No No   Depression Screen PHQ 2/9 Scores 06/22/2016 06/22/2016 06/17/2015  PHQ - 2 Score 0 0 0    Cognitive Testing MMSE - Mini Mental State Exam 06/22/2016  Orientation to time 5  Orientation to Place 5  Registration 3  Attention/ Calculation 0  Recall 3  Language- name 2 objects 0  Language- repeat 1  Language- follow 3 step command 3  Language- read & follow direction 0  Write a sentence 0  Copy design 0  Total score 20   PLEASE NOTE: A Mini-Cog screen was completed. Maximum score is 20. A value of 0 denotes this part of [place] MMSE was not completed or the patient  failed this part of the Mini-Cog screening.   Mini-Cog Screening Orientation to Time - Max 5 pts Orientation to Place - Max 5 pts Registration - Max 3 pts Recall - Max 3 pts Language Repeat - Max 1 pts Language Follow 3 Step Command - Max 3 pts  Immunization History  Administered Date(s) Administered  . Influenza Split 10/15/2011, 10/09/2012  . Influenza Whole 09/28/2002, 12/05/2007, 10/22/2008, 10/15/2010  . Influenza,inj,Quad PF,36+ Mos 10/23/2013, 10/21/2014, 10/20/2015  . Pneumococcal Conjugate-13 11/13/2013  . Pneumococcal Polysaccharide-23 01/08/2014  . Td 06/01/2006  . Zoster 11/26/2014   Screening Tests Health Maintenance  Topic Date Due  . TETANUS/TDAP  06/22/2017 (Originally 06/01/2016)  . INFLUENZA VACCINE  07/06/2016  . HEMOGLOBIN A1C  12/18/2016  . OPHTHALMOLOGY EXAM  01/28/2017  . FOOT EXAM  02/18/2017  . PNEUMOCOCCAL POLYSACCHARIDE VACCINE (2) 01/08/2019  . COLONOSCOPY  05/15/2020  . ZOSTAVAX  Completed  . Hepatitis C Screening  Completed  . HIV Screening  Completed      Plan:     I have personally reviewed and addressed the Medicare Annual Wellness questionnaire and have noted the following in the patient's chart:  A. Medical and social history B. Use of alcohol, tobacco or illicit drugs  C. Current medications and  supplements D. Functional ability and status E.  Nutritional status F.  Physical activity G. Advance directives H. List of other physicians I.  Hospitalizations, surgeries, and ER visits in previous 12 months J.  [place] to include hearing, vision, cognitive, depression L. Referrals and appointments - none  In addition, I have reviewed and discussed with patient certain preventive protocols, quality metrics, and best practice recommendations. A written personalized care plan for preventive services as well as general preventive health recommendations were provided to patient.  See attached scanned questionnaire for additional information.   Signed,   Lindell Noe, MHA, BS, LPN Health Advisor

## 2016-06-22 NOTE — Progress Notes (Signed)
Pre visit review using our clinic review tool, if applicable. No additional management support is needed unless otherwise documented below in the visit note. 

## 2016-06-22 NOTE — Patient Instructions (Signed)
You can try going up on the cholesterol medicine to 5 pills a week.  If the aches get worse, then cut back as needed.  Take care.  Glad to see you.  Recheck labs in about 4 months before a visit.

## 2016-06-22 NOTE — Patient Instructions (Signed)
[name] , Thank you for taking time to come for your Medicare Wellness Visit. I appreciate your ongoing commitment to your health goals. Please review the following plan we discussed and let me know if I can assist you in the future.   These are the goals we discussed: Goals    . Increase physical activity     Starting 06/22/16, I will continue to exercise for at least 60 min 3 days per week.        This is a list of the screening recommended for you and due dates:  Health Maintenance  Topic Date Due  . Tetanus Vaccine  06/22/2017*  . Flu Shot  07/06/2016  . Hemoglobin A1C  12/18/2016  . Eye exam for diabetics  01/28/2017  . Complete foot exam   02/18/2017  . Pneumococcal vaccine (2) 01/08/2019  . Colon Cancer Screening  05/15/2020  . Shingles Vaccine  Completed  .  Hepatitis C: One time screening is recommended by Center for Disease Control  (CDC) for  adults born from [birth year] through 1967.   Completed  . HIV Screening  Completed  *Topic was postponed. The date shown is not the original due date.    Preventive Care for Adults  A healthy lifestyle and preventive care can promote health and wellness. Preventive health guidelines for adults include the following key practices.  . A routine yearly physical is a good way to check with your health care provider about your health and preventive screening. It is a chance to share any concerns and updates on your health and to receive a thorough exam.  . Visit your dentist for a routine exam and preventive care every 6 months. Brush your teeth twice a day and floss once a day. Good oral hygiene prevents tooth decay and gum disease.  . The frequency of eye exams is based on your age, health, family medical history, use  of contact lenses, and other factors. Follow your health care provider's ecommendations for frequency of eye exams.  . Eat a healthy diet. Foods like vegetables, fruits, whole grains, low-fat dairy products, and lean  protein foods contain the nutrients you need without too many calories. Decrease your intake of foods high in solid fats, added sugars, and salt. Eat the right amount of calories for you. Get information about a proper diet from your health care provider, if necessary.  . Regular physical exercise is one of the most important things you can do for your health. Most adults should get at least 150 minutes of moderate-intensity exercise (any activity that increases your heart rate and causes you to sweat) each week. In addition, most adults need muscle-strengthening exercises on 2 or more days a week.  Silver Sneakers may be a benefit available to you. To determine eligibility, you may visit the website: www.silversneakers.com or contact program at [phone]) [phone] Mon-Fri between 8AM-8PM.   . Maintain a healthy weight. The body mass index (BMI) is a screening tool to identify possible weight problems. It provides an estimate of body fat based on height and weight. Your health care provider can find your BMI and can help you achieve or maintain a healthy weight.   For adults 20 years and older: ? A BMI below 18.5 is considered underweight. ? A BMI of 18.5 to 24.9 is normal. ? A BMI of 25 to 29.9 is considered overweight. ? A BMI of 30 and above is considered obese.   . Maintain normal blood lipids and cholesterol levels  by exercising and minimizing your intake of saturated fat. Eat a balanced diet with plenty of fruit and vegetables. Blood tests for lipids and cholesterol should begin at age 72 and be repeated every 5 years. If your lipid or cholesterol levels are high, you are over 50, or you are at high risk for heart disease, you may need your cholesterol levels checked more frequently. Ongoing high lipid and cholesterol levels should be treated with medicines if diet and exercise are not working.  . If you smoke, find out from your health care provider how to quit. If you do not use tobacco, please  do not start.  . If you choose to drink alcohol, please do not consume more than 2 drinks per day. One drink is considered to be 12 ounces (355 mL) of beer, 5 ounces (148 mL) of wine, or 1.5 ounces (44 mL) of liquor.  . If you are 79-60 years old, ask your health care provider if you should take aspirin to prevent strokes.  . Use sunscreen. Apply sunscreen liberally and repeatedly throughout the day. You should seek shade when your shadow is shorter than you. Protect yourself by wearing long sleeves, pants, a wide-brimmed hat, and sunglasses year round, whenever you are outdoors.  . Once a month, do a whole body skin exam, using a mirror to look at the skin on your back. Tell your health care provider of new moles, moles that have irregular borders, moles that are larger than a pencil eraser, or moles that have changed in shape or color.

## 2016-06-22 NOTE — Progress Notes (Signed)
PCP notes:  Health maintenance:  Tetanus - postponed/insurance  Abnormal screenings:  Hearing - failed  Patient concerns: None  Nurse concerns: None  Next PCP appt: 06/22/16 @ 0945  I reviewed health advisor's note, was available for consultation on the day of service listed in this note, and agree with documentation and plan. Elsie Stain, MD.

## 2016-06-23 DIAGNOSIS — Z7189 Other specified counseling: Secondary | ICD-10-CM | POA: Insufficient documentation

## 2016-06-23 NOTE — Assessment & Plan Note (Addendum)
A1c at goal.  A1c d/w pt.  Neuropathy at baseline.  On lyrica BID and he didn't want to inc at this point.  D/w pt.  He'll update me as needed.  D/w pt about foot care.  No skin lesions now.   >25 minutes spent in face to face time with patient, >50% spent in counselling or coordination of care.

## 2016-06-23 NOTE — Assessment & Plan Note (Signed)
LDL up slightly.  Taking statin 4 days a week.  Able to tolerate current dosing.  D/w pt about trying 5 days per week dosing, would be reasonable to try, d/w pt.  See AVS.

## 2016-10-17 ENCOUNTER — Other Ambulatory Visit: Payer: Self-pay | Admitting: Family Medicine

## 2016-10-17 DIAGNOSIS — E1149 Type 2 diabetes mellitus with other diabetic neurological complication: Secondary | ICD-10-CM

## 2016-10-21 ENCOUNTER — Other Ambulatory Visit (INDEPENDENT_AMBULATORY_CARE_PROVIDER_SITE_OTHER): Payer: PRIVATE HEALTH INSURANCE

## 2016-10-21 DIAGNOSIS — E1149 Type 2 diabetes mellitus with other diabetic neurological complication: Secondary | ICD-10-CM

## 2016-10-21 LAB — HEMOGLOBIN A1C: Hgb A1c MFr Bld: 6.3 % (ref 4.6–6.5)

## 2016-10-26 ENCOUNTER — Encounter: Payer: Self-pay | Admitting: Family Medicine

## 2016-10-26 ENCOUNTER — Ambulatory Visit (INDEPENDENT_AMBULATORY_CARE_PROVIDER_SITE_OTHER): Payer: PRIVATE HEALTH INSURANCE | Admitting: Family Medicine

## 2016-10-26 VITALS — BP 130/62 | HR 58 | Temp 97.8°F | Wt 194.0 lb

## 2016-10-26 DIAGNOSIS — Z23 Encounter for immunization: Secondary | ICD-10-CM

## 2016-10-26 DIAGNOSIS — E1149 Type 2 diabetes mellitus with other diabetic neurological complication: Secondary | ICD-10-CM

## 2016-10-26 DIAGNOSIS — R0981 Nasal congestion: Secondary | ICD-10-CM

## 2016-10-26 DIAGNOSIS — I10 Essential (primary) hypertension: Secondary | ICD-10-CM | POA: Diagnosis not present

## 2016-10-26 DIAGNOSIS — E785 Hyperlipidemia, unspecified: Secondary | ICD-10-CM

## 2016-10-26 MED ORDER — ROSUVASTATIN CALCIUM 10 MG PO TABS: ORAL_TABLET | ORAL | 3 refills | Status: DC

## 2016-10-26 MED ORDER — PREGABALIN 75 MG PO CAPS: ORAL_CAPSULE | ORAL | 1 refills | Status: DC

## 2016-10-26 MED ORDER — VARDENAFIL HCL 10 MG PO TABS: 10.0000 mg | ORAL_TABLET | Freq: Every day | ORAL | 3 refills | Status: DC | PRN

## 2016-10-26 MED ORDER — GLIMEPIRIDE 2 MG PO TABS: 2.0000 mg | ORAL_TABLET | Freq: Every day | ORAL | 3 refills | Status: DC

## 2016-10-26 MED ORDER — PIOGLITAZONE HCL 30 MG PO TABS: 30.0000 mg | ORAL_TABLET | Freq: Every day | ORAL | 3 refills | Status: DC

## 2016-10-26 MED ORDER — LOSARTAN POTASSIUM 50 MG PO TABS: 50.0000 mg | ORAL_TABLET | Freq: Every day | ORAL | 3 refills | Status: DC

## 2016-10-26 MED ORDER — ETODOLAC 500 MG PO TABS: 500.0000 mg | ORAL_TABLET | Freq: Two times a day (BID) | ORAL | 3 refills | Status: DC

## 2016-10-26 MED ORDER — METFORMIN HCL 500 MG PO TABS: 500.0000 mg | ORAL_TABLET | Freq: Two times a day (BID) | ORAL | 3 refills | Status: DC

## 2016-10-26 MED ORDER — OMEPRAZOLE 20 MG PO CPDR: 20.0000 mg | DELAYED_RELEASE_CAPSULE | Freq: Two times a day (BID) | ORAL | 3 refills | Status: DC

## 2016-10-26 NOTE — Progress Notes (Signed)
Pre visit review using our clinic review tool, if applicable. No additional management support is needed unless otherwise documented below in the visit note. 

## 2016-10-26 NOTE — Patient Instructions (Signed)
Don't change your regular meds for now but add on nasal saline up to twice a day and see if that helps the right sided pain.  Recheck in about 4 months, A1c ahead of time.  Take care.  Glad to see you.  Update me as needed.

## 2016-10-26 NOTE — Progress Notes (Signed)
Diabetes:  Using medications without difficulties: yes Hypoglycemic episodes:no Hyperglycemic episodes:no Feet problems: at baseline, still on lyrica and putting up with pain.   Blood Sugars averaging: 120-140 usually A1c controlled, 6.2--->6.3 now.  D/w pt.   eye exam within last year:yes  Hypertension:    Using medication without problems or lightheadedness: yes Chest pain with exertion:no Edema:no Short of breath:no  Elevated Cholesterol: Using medications without problems:yes Muscle aches: some aches/cramps but able to tolerate taking 4-5 times a week.   D/w pt.  Likely able to continue as is.   Diet compliance: yes Exercise:as tolerated.   Some occ R frontal HA (with some nasal congestion at baseline).  No fevers.  No ear pain.  Has used nasal sprays (OTC) prev with relief, but unrecalled med. No ST.    PMH and SH reviewed  Meds, vitals, and allergies reviewed.   ROS: Per HPI unless specifically indicated in ROS section   GEN: nad, alert and oriented HEENT: mucous membranes moist, tm wnl, R>L nasal congestion, sinuses not ttp x4 at time of exam.  NECK: supple w/o LA CV: rrr. PULM: ctab, no inc wob ABD: soft, +bs EXT: no edema SKIN: no acute rash  Diabetic foot exam: Normal inspection No skin breakdown No calluses  Normal DP pulses Dec sensation to monofilament but normal sensation to light touch.  Nails normal

## 2016-10-27 DIAGNOSIS — R0981 Nasal congestion: Secondary | ICD-10-CM | POA: Insufficient documentation

## 2016-10-27 NOTE — Assessment & Plan Note (Signed)
Continue current medications. A1c controled. He is able to put up with foot symptoms with Lyrica at current dose. No adverse effect otherwise on meds. Recheck in a few months. He agrees. Lab d/w pt.

## 2016-10-27 NOTE — Assessment & Plan Note (Signed)
Controlled.Continue as is. No change in medicines. He agrees.

## 2016-10-27 NOTE — Assessment & Plan Note (Signed)
Add on nasal saline and update me as needed.

## 2016-10-27 NOTE — Assessment & Plan Note (Signed)
Able to tolerate cramps with statin dose 4-5 times a week.  Continue as is. He agree.

## 2016-12-31 ENCOUNTER — Other Ambulatory Visit: Payer: Self-pay | Admitting: Family Medicine

## 2016-12-31 NOTE — Telephone Encounter (Signed)
Last filled 11/17 #180 with 1 refill---this is a different pharmacy.Marland KitchenMarland KitchenPlease advise

## 2017-01-02 NOTE — Telephone Encounter (Signed)
Please verify with pharmacy and then fax in.  Okay with me to change pharmacy.   Thanks.

## 2017-01-03 NOTE — Telephone Encounter (Signed)
Spoke to [place] at the pharmacy and was advised that patient probably did request the refill from them because it was lasted filled with them and would be due now. Refill given to [facility] by telephone as instructed.

## 2017-02-20 ENCOUNTER — Other Ambulatory Visit: Payer: Self-pay | Admitting: Family Medicine

## 2017-02-20 DIAGNOSIS — E1149 Type 2 diabetes mellitus with other diabetic neurological complication: Secondary | ICD-10-CM

## 2017-02-22 ENCOUNTER — Other Ambulatory Visit (INDEPENDENT_AMBULATORY_CARE_PROVIDER_SITE_OTHER): Payer: PRIVATE HEALTH INSURANCE

## 2017-02-22 DIAGNOSIS — E1149 Type 2 diabetes mellitus with other diabetic neurological complication: Secondary | ICD-10-CM

## 2017-02-22 LAB — HEMOGLOBIN A1C: HEMOGLOBIN A1C: 6.5 % (ref 4.6–6.5)

## 2017-02-24 ENCOUNTER — Ambulatory Visit: Payer: PRIVATE HEALTH INSURANCE | Admitting: Family Medicine

## 2017-02-24 ENCOUNTER — Encounter: Payer: Self-pay | Admitting: Family Medicine

## 2017-02-24 ENCOUNTER — Ambulatory Visit (INDEPENDENT_AMBULATORY_CARE_PROVIDER_SITE_OTHER): Payer: PRIVATE HEALTH INSURANCE | Admitting: Family Medicine

## 2017-02-24 DIAGNOSIS — E1149 Type 2 diabetes mellitus with other diabetic neurological complication: Secondary | ICD-10-CM

## 2017-02-24 NOTE — Patient Instructions (Signed)
Recheck labs before a physical in about 3-4 months.   Update me as needed.   Take care.  Glad to see you.

## 2017-02-24 NOTE — Progress Notes (Signed)
Pre visit review using our clinic review tool, if applicable. No additional management support is needed unless otherwise documented below in the visit note. 

## 2017-02-24 NOTE — Assessment & Plan Note (Signed)
Reasonable to continue as is with diet and exercise, no change in meds.  He is tolerating neuropathy.  See AVS.  He agrees.

## 2017-02-24 NOTE — Progress Notes (Signed)
Diabetes:  Using medications without difficulties:yes Hypoglycemic episodes:no Hyperglycemic episodes:no Feet problems: at baseline, tolerable per patient report with current lyrica dose.  "Until I can't stand the pain, I don't want to increase the lyrica."  Blood Sugars averaging: 110-130 eye exam within last year: due, d/w pt.   We talked about actos.  At this point, there is no clear evidence to direct a change in meds (re: actos).  Med is tolerated (no h/o bladder CA and no h/o blood in urine) and benefit from control of DM2 outweighs other considerations.  Pt agrees to continue actos.   A1c controlled, d/w pt.  He is still working on diet and exercise.    Meds, vitals, and allergies reviewed.   ROS: Per HPI unless specifically indicated in ROS section   GEN: nad, alert and oriented HEENT: mucous membranes moist NECK: supple w/o LA CV: rrr. PULM: ctab, no inc wob ABD: soft, +bs EXT: no edema

## 2017-06-20 ENCOUNTER — Other Ambulatory Visit: Payer: Self-pay | Admitting: Family Medicine

## 2017-06-20 DIAGNOSIS — Z125 Encounter for screening for malignant neoplasm of prostate: Secondary | ICD-10-CM

## 2017-06-20 DIAGNOSIS — E1149 Type 2 diabetes mellitus with other diabetic neurological complication: Secondary | ICD-10-CM

## 2017-06-23 ENCOUNTER — Other Ambulatory Visit (INDEPENDENT_AMBULATORY_CARE_PROVIDER_SITE_OTHER): Payer: PRIVATE HEALTH INSURANCE

## 2017-06-23 DIAGNOSIS — E1149 Type 2 diabetes mellitus with other diabetic neurological complication: Secondary | ICD-10-CM

## 2017-06-23 DIAGNOSIS — Z125 Encounter for screening for malignant neoplasm of prostate: Secondary | ICD-10-CM | POA: Diagnosis not present

## 2017-06-23 LAB — COMPREHENSIVE METABOLIC PANEL
ALBUMIN: 4.4 g/dL (ref 3.5–5.2)
ALK PHOS: 43 U/L (ref 39–117)
ALT: 22 U/L (ref 0–53)
AST: 20 U/L (ref 0–37)
BUN: 20 mg/dL (ref 6–23)
CHLORIDE: 102 meq/L (ref 96–112)
CO2: 30 mEq/L (ref 19–32)
CREATININE: 0.94 mg/dL (ref 0.40–1.50)
Calcium: 9.6 mg/dL (ref 8.4–10.5)
GFR: 86.19 mL/min (ref >60.00)
Glucose, Bld: 132 mg/dL — ABNORMAL HIGH (ref 70–99)
Potassium: 5 mEq/L (ref 3.5–5.1)
SODIUM: 140 meq/L (ref 135–145)
TOTAL PROTEIN: 7 g/dL (ref 6.0–8.3)
Total Bilirubin: 1 mg/dL (ref 0.2–1.2)

## 2017-06-23 LAB — LIPID PANEL
CHOLESTEROL: 137 mg/dL (ref 0–200)
HDL: 55.3 mg/dL (ref >39.00)
LDL CALC: 70 mg/dL (ref 0–99)
NONHDL: 81.89
Total CHOL/HDL Ratio: 2
Triglycerides: 59 mg/dL (ref 0.0–149.0)
VLDL: 11.8 mg/dL (ref 0.0–40.0)

## 2017-06-23 LAB — HEMOGLOBIN A1C: Hgb A1c MFr Bld: 6.4 % (ref 4.6–6.5)

## 2017-06-23 LAB — PSA, MEDICARE: PSA: 0.31 ng/ml (ref 0.10–4.00)

## 2017-06-28 ENCOUNTER — Encounter: Payer: Self-pay | Admitting: Family Medicine

## 2017-06-28 ENCOUNTER — Ambulatory Visit (INDEPENDENT_AMBULATORY_CARE_PROVIDER_SITE_OTHER): Payer: PRIVATE HEALTH INSURANCE | Admitting: Family Medicine

## 2017-06-28 VITALS — BP 130/60 | HR 62 | Temp 98.0°F | Ht 70.0 in | Wt 193.5 lb

## 2017-06-28 DIAGNOSIS — I10 Essential (primary) hypertension: Secondary | ICD-10-CM

## 2017-06-28 DIAGNOSIS — Z0000 Encounter for general adult medical examination without abnormal findings: Secondary | ICD-10-CM

## 2017-06-28 DIAGNOSIS — E1149 Type 2 diabetes mellitus with other diabetic neurological complication: Secondary | ICD-10-CM | POA: Diagnosis not present

## 2017-06-28 DIAGNOSIS — E785 Hyperlipidemia, unspecified: Secondary | ICD-10-CM | POA: Diagnosis not present

## 2017-06-28 MED ORDER — PREGABALIN 75 MG PO CAPS: ORAL_CAPSULE | ORAL | 1 refills | Status: DC

## 2017-06-28 NOTE — Progress Notes (Signed)
I have personally reviewed the Medicare Annual Wellness questionnaire and have noted 1. The patient's medical and social history 2. Their use of alcohol, tobacco or illicit drugs 3. Their current medications and supplements 4. The patient's functional ability including ADL's, fall risks, home safety risks and hearing or visual             impairment. 5. Diet and physical activities 6. Evidence for depression or mood disorders  The patients weight, height, BMI have been recorded in the chart and visual acuity is per eye clinic.  I have made referrals, counseling and provided education to the patient based review of the above and I have provided the pt with a written personalized care plan for preventive services.  Provider list updated- see scanned forms.  Routine anticipatory guidance given to patient.  See health maintenance. The possibility exists that previously documented standard health maintenance information may have been brought forward from a previous encounter into this note.  If needed, that same information has been updated to reflect the current situation based on today's encounter.    Flu encouraged Shingles up to date  PNA up to date Tetanus due, d/w pt.  See AVS.  Colonoscopy 2009 Prostate cancer screening- PSA wnl Advance directive- wife designated if patient were incapacitated. Cognitive function addressed- see scanned forms- and if abnormal then additional documentation follows.   Diabetes:  Using medications without difficulties:yes Hypoglycemic episodes:no Hyperglycemic episodes:no Feet problems: at baseline, still with tingling and pain, see below.   Blood Sugars averaging: ~130s on home check eye exam within last year: f/u pending.  We talked about his lyrica dose.  He has some fatigue with the medicine but he is tolerating the side effect and the pain as is, with some relief from the medicine.    Hypertension:    Using medication without problems or  lightheadedness: yes Chest pain with exertion:no Edema:no Short of breath:no  Elevated Cholesterol: Using medications without problems: yes Muscle aches: some pain likely from neuropathy, has been able to tolerate crestor with some aches.  Diet compliance: yes Exercise:yes Labs d/w pt.   He has worked hard on diet and exercise.   PMH and SH reviewed  Meds, vitals, and allergies reviewed.   ROS: Per HPI.  Unless specifically indicated otherwise in HPI, the patient denies:  General: fever. Eyes: acute vision changes ENT: sore throat Cardiovascular: chest pain Respiratory: SOB GI: vomiting GU: dysuria Musculoskeletal: acute back pain Derm: acute rash Neuro: acute motor dysfunction Psych: worsening mood Endocrine: polydipsia Heme: bleeding Allergy: hayfever  GEN: nad, alert and oriented HEENT: mucous membranes moist NECK: supple w/o LA CV: rrr. PULM: ctab, no inc wob ABD: soft, +bs EXT: no edema SKIN: no acute rash  Diabetic foot exam: Normal inspection No skin breakdown No calluses  Normal DP pulses Dec sensation to light touch and monofilament in BLE  Nails normal

## 2017-06-28 NOTE — Patient Instructions (Addendum)
Check with your insurance to see if they will cover the tetanus shot. I would get a flu shot each fall.   Recheck labs in about 4 months prior to a visit.  Thank you for your effort.   Take care.  Glad to see you.

## 2017-06-29 NOTE — Assessment & Plan Note (Signed)
Flu encouraged Shingles up to date  PNA up to date Tetanus due, d/w pt.  See AVS.  Colonoscopy 2009 Prostate cancer screening- PSA wnl Advance directive- wife designated if patient were incapacitated. Cognitive function addressed- see scanned forms- and if abnormal then additional documentation follows.

## 2017-06-29 NOTE — Assessment & Plan Note (Signed)
Reasonable control. Labs discussed with patient. No change in meds. He agrees. Continue work on diet and exercise.

## 2017-06-29 NOTE — Assessment & Plan Note (Addendum)
He is able to tolerate statin as is. Continue as is. Continue work on diet and exercise. He agrees. Labs discussed with patient.

## 2017-06-29 NOTE — Assessment & Plan Note (Signed)
Reasonable control of diabetes based on his A1c. Not having episodes of hypoglycemia. Continue current medications as is. He does have significant neuropathy with significant pain, he is able to tolerate his current situation with his current dose of Lyrica. We talked about the possibility of a slow increase in his dose at some point in the future when his functional status is affected more by neuropathy and pain, but he is not at that point yet. Continue work on diet and exercise. Recheck periodically. He agrees.

## 2017-08-10 DIAGNOSIS — E119 Type 2 diabetes mellitus without complications: Secondary | ICD-10-CM | POA: Diagnosis not present

## 2017-08-11 LAB — HM DIABETES EYE EXAM

## 2017-08-16 ENCOUNTER — Encounter: Payer: Self-pay | Admitting: Family Medicine

## 2017-10-17 ENCOUNTER — Other Ambulatory Visit: Payer: Self-pay | Admitting: Family Medicine

## 2017-10-17 DIAGNOSIS — E1149 Type 2 diabetes mellitus with other diabetic neurological complication: Secondary | ICD-10-CM

## 2017-10-18 ENCOUNTER — Other Ambulatory Visit (INDEPENDENT_AMBULATORY_CARE_PROVIDER_SITE_OTHER): Payer: PRIVATE HEALTH INSURANCE

## 2017-10-18 DIAGNOSIS — E1149 Type 2 diabetes mellitus with other diabetic neurological complication: Secondary | ICD-10-CM | POA: Diagnosis not present

## 2017-10-18 LAB — HEMOGLOBIN A1C: Hgb A1c MFr Bld: 6.4 % (ref 4.6–6.5)

## 2017-10-20 ENCOUNTER — Encounter: Payer: Self-pay | Admitting: Family Medicine

## 2017-10-20 ENCOUNTER — Ambulatory Visit (INDEPENDENT_AMBULATORY_CARE_PROVIDER_SITE_OTHER): Payer: PRIVATE HEALTH INSURANCE | Admitting: Family Medicine

## 2017-10-20 VITALS — BP 120/60 | HR 52 | Temp 97.9°F | Wt 187.2 lb

## 2017-10-20 DIAGNOSIS — Z23 Encounter for immunization: Secondary | ICD-10-CM | POA: Diagnosis not present

## 2017-10-20 DIAGNOSIS — E1149 Type 2 diabetes mellitus with other diabetic neurological complication: Secondary | ICD-10-CM

## 2017-10-20 DIAGNOSIS — I10 Essential (primary) hypertension: Secondary | ICD-10-CM | POA: Diagnosis not present

## 2017-10-20 MED ORDER — VARDENAFIL HCL 10 MG PO TABS: 10.0000 mg | ORAL_TABLET | Freq: Every day | ORAL | 3 refills | Status: DC | PRN

## 2017-10-20 MED ORDER — OMEPRAZOLE 20 MG PO CPDR: 20.0000 mg | DELAYED_RELEASE_CAPSULE | Freq: Two times a day (BID) | ORAL | 3 refills | Status: DC

## 2017-10-20 MED ORDER — PIOGLITAZONE HCL 30 MG PO TABS: 30.0000 mg | ORAL_TABLET | Freq: Every day | ORAL | 3 refills | Status: DC

## 2017-10-20 MED ORDER — METFORMIN HCL 500 MG PO TABS: 500.0000 mg | ORAL_TABLET | Freq: Two times a day (BID) | ORAL | 3 refills | Status: DC

## 2017-10-20 MED ORDER — ONDANSETRON HCL 4 MG PO TABS: 4.0000 mg | ORAL_TABLET | Freq: Three times a day (TID) | ORAL | 5 refills | Status: DC | PRN

## 2017-10-20 MED ORDER — ROSUVASTATIN CALCIUM 10 MG PO TABS: ORAL_TABLET | ORAL | 3 refills | Status: DC

## 2017-10-20 MED ORDER — GLIMEPIRIDE 2 MG PO TABS: 2.0000 mg | ORAL_TABLET | Freq: Every day | ORAL | 3 refills | Status: DC

## 2017-10-20 MED ORDER — LOSARTAN POTASSIUM 50 MG PO TABS: 50.0000 mg | ORAL_TABLET | Freq: Every day | ORAL | 3 refills | Status: DC

## 2017-10-20 MED ORDER — PREGABALIN 75 MG PO CAPS: ORAL_CAPSULE | ORAL | 1 refills | Status: DC

## 2017-10-20 MED ORDER — ETODOLAC 500 MG PO TABS: 500.0000 mg | ORAL_TABLET | Freq: Two times a day (BID) | ORAL | 3 refills | Status: DC

## 2017-10-20 NOTE — Progress Notes (Signed)
Diabetes:  Using medications without difficulties:yes Hypoglycemic episodes:only if prolonged fasting.  He can get distracted and put off lunch and then have sx, d/w pt.  He is aware and adjusts his schedule as needed.  No nocturnal sx.   Hyperglycemic episodes:no Feet problems: at baseline.  He is still putting up with neuropathy sx with current dose of lyrica.   Blood Sugars averaging: usually ~130-150s eye exam within last year:yes A1c stable, d/w pt.    He had some occ nausea and was asking about zofran use.  This is likely the best option since he had sedation with phenergan.  No blood in stool.    Med refills done.  D/w pt.    PMH and SH reviewed  Meds, vitals, and allergies reviewed.   ROS: Per HPI unless specifically indicated in ROS section   GEN: nad, alert and oriented HEENT: mucous membranes moist NECK: supple w/o LA CV: rrr. PULM: ctab, no inc wob ABD: soft, +bs EXT: no edema SKIN: no acute rash  Diabetic foot exam: Normal inspection No skin breakdown No calluses  Normal DP pulses Dec sensation to light touch and monofilament Nails normal

## 2017-10-20 NOTE — Patient Instructions (Signed)
Check to see if you tetanus shot will be cheaper at the pharmacy.   Take care.  Glad to see you.  Recheck in about 4 months, labs ahead of time.

## 2017-10-24 NOTE — Assessment & Plan Note (Signed)
Continue as is.  He may have a component of gastroparesis.  At this point, continue with prn zofran and recheck periodically.  D/w pt about foot care.  No other change in meds.  He agrees.

## 2017-11-24 ENCOUNTER — Ambulatory Visit (INDEPENDENT_AMBULATORY_CARE_PROVIDER_SITE_OTHER): Payer: PRIVATE HEALTH INSURANCE

## 2017-11-24 DIAGNOSIS — Z23 Encounter for immunization: Secondary | ICD-10-CM

## 2017-12-16 ENCOUNTER — Telehealth: Payer: Self-pay | Admitting: Family Medicine

## 2017-12-16 MED ORDER — GLUCOSE BLOOD VI STRP: ORAL_STRIP | 3 refills | Status: DC

## 2017-12-16 NOTE — Telephone Encounter (Signed)
Copied from [place]. Topic: Quick Communication - Rx Refill/Question >> Dec 16, 2017 11:02 AM Percell Belt A wrote: Medication:  glucose blood (RELION CONFIRM/MICRO TEST) test strip [[id number]]   Has the patient contacted their pharmacy? no   (Agent: If no, request that the patient contact the pharmacy for the refill.)   Preferred Pharmacy (with phone number or street name): [facility] [address] in [place]   Agent: Please be advised that RX refills may take up to 3 business days. We ask that you follow-up with your pharmacy.

## 2017-12-20 ENCOUNTER — Other Ambulatory Visit: Payer: Self-pay | Admitting: *Deleted

## 2017-12-20 MED ORDER — GLUCOSE BLOOD VI STRP: ORAL_STRIP | 2 refills | Status: DC

## 2017-12-20 NOTE — Telephone Encounter (Signed)
New Rx for test strips:  OneTouch Verio Flex sent to pharmacy.

## 2017-12-20 NOTE — Telephone Encounter (Signed)
Pt states he has switched insurance companies and needs a different dm test strips, not the one he usually. (they will only pay for one touch test verio flex strips)  So pt needs a new rx for the  One touch verio flex test strips  [place], [place] - [place] [phone] (Phone) [phone] (Fax)    The wrong strips were sent in. Please cancel and sent the correct strips.  Last OV 10/20/17

## 2017-12-20 NOTE — Telephone Encounter (Signed)
Pt states he has switched insurance companies and needs a different dm test strips, not the one he usually. (they will only pay for one touch test verio flex strips)  So pt needs a new rx for the  One touch verio flex test strips  [place], [place] - [place] [phone] (Phone) [phone] (Fax)    The wrong strips were sent in. Please cancel and sent the correct strips.

## 2017-12-21 DIAGNOSIS — R69 Illness, unspecified: Secondary | ICD-10-CM | POA: Diagnosis not present

## 2018-01-03 DIAGNOSIS — R69 Illness, unspecified: Secondary | ICD-10-CM | POA: Diagnosis not present

## 2018-01-16 ENCOUNTER — Other Ambulatory Visit: Payer: Self-pay | Admitting: Family Medicine

## 2018-01-16 DIAGNOSIS — E1149 Type 2 diabetes mellitus with other diabetic neurological complication: Secondary | ICD-10-CM

## 2018-01-17 ENCOUNTER — Other Ambulatory Visit (INDEPENDENT_AMBULATORY_CARE_PROVIDER_SITE_OTHER): Payer: Medicare HMO

## 2018-01-17 DIAGNOSIS — E1149 Type 2 diabetes mellitus with other diabetic neurological complication: Secondary | ICD-10-CM

## 2018-01-17 LAB — HEMOGLOBIN A1C: HEMOGLOBIN A1C: 6.4 % (ref 4.6–6.5)

## 2018-01-23 ENCOUNTER — Ambulatory Visit (INDEPENDENT_AMBULATORY_CARE_PROVIDER_SITE_OTHER): Payer: Medicare HMO | Admitting: Family Medicine

## 2018-01-23 ENCOUNTER — Encounter: Payer: Self-pay | Admitting: Family Medicine

## 2018-01-23 VITALS — BP 122/50 | HR 60 | Temp 97.7°F | Wt 193.5 lb

## 2018-01-23 DIAGNOSIS — E1149 Type 2 diabetes mellitus with other diabetic neurological complication: Secondary | ICD-10-CM | POA: Diagnosis not present

## 2018-01-23 DIAGNOSIS — I10 Essential (primary) hypertension: Secondary | ICD-10-CM

## 2018-01-23 MED ORDER — LOSARTAN POTASSIUM 50 MG PO TABS: 25.0000 mg | ORAL_TABLET | Freq: Every day | ORAL | Status: DC

## 2018-01-23 NOTE — Patient Instructions (Addendum)
Try cutting the losartan in half, down to 25mg  a day.  If you are still lightheaded after about 1 week, then stop the medicine.  Update me as needed.  Thanks for your effort.   Recheck in about 4 months with labs ahead of time.  Take care.  Glad to see you.  Update me as needed.

## 2018-01-23 NOTE — Progress Notes (Signed)
Diabetes:  Using medications without difficulties:yes Hypoglycemic episodes:no Hyperglycemic episodes:no Feet problems: at baseline, he is putting up with baseline pain.  Still on lyrica, tolerating med.  He didn't want to increase his lyrica at this point.  Blood Sugars averaging: usually ~130-140 eye exam within last year:yes A1c controlled, d/w pt.  Similar to prev.    He has been able to tolerate promethazine at 12.5mg  per dose.  D/w pt.    He can occ get mildly lightheaded.  D/w pt.    Meds, vitals, and allergies reviewed.   ROS: Per HPI unless specifically indicated in ROS section   GEN: nad, alert and oriented HEENT: mucous membranes moist NECK: supple w/o LA CV: rrr. PULM: ctab, no inc wob ABD: soft, +bs EXT: no edema  Diabetic foot exam: Normal inspection No skin breakdown No calluses  Normal DP pulses Dec sensation to light touch and monofilament Nails normal

## 2018-01-23 NOTE — Assessment & Plan Note (Signed)
D/w pt about cutting the losartan in half, down to 25mg  a day.  If still lightheaded after about 1 week, then stop the medicine.  Update me as needed.  Recheck in about 4 months with labs ahead of time.  D/w pt about diet and exercise.   Labs d/w pt.  A1c at goal.  See AVS.

## 2018-01-30 DIAGNOSIS — R69 Illness, unspecified: Secondary | ICD-10-CM | POA: Diagnosis not present

## 2018-02-15 DIAGNOSIS — R69 Illness, unspecified: Secondary | ICD-10-CM | POA: Diagnosis not present

## 2018-02-27 ENCOUNTER — Telehealth: Payer: Self-pay | Admitting: *Deleted

## 2018-02-27 NOTE — Telephone Encounter (Signed)
PA for Omeprazole submitted thru [facility], awaiting response.

## 2018-02-28 NOTE — Telephone Encounter (Signed)
PA approval letter received and scanned. 

## 2018-03-09 DIAGNOSIS — R69 Illness, unspecified: Secondary | ICD-10-CM | POA: Diagnosis not present

## 2018-04-25 DIAGNOSIS — Z809 Family history of malignant neoplasm, unspecified: Secondary | ICD-10-CM | POA: Diagnosis not present

## 2018-04-25 DIAGNOSIS — Z791 Long term (current) use of non-steroidal anti-inflammatories (NSAID): Secondary | ICD-10-CM | POA: Diagnosis not present

## 2018-04-25 DIAGNOSIS — R110 Nausea: Secondary | ICD-10-CM | POA: Diagnosis not present

## 2018-04-25 DIAGNOSIS — K219 Gastro-esophageal reflux disease without esophagitis: Secondary | ICD-10-CM | POA: Diagnosis not present

## 2018-04-25 DIAGNOSIS — E1142 Type 2 diabetes mellitus with diabetic polyneuropathy: Secondary | ICD-10-CM | POA: Diagnosis not present

## 2018-04-25 DIAGNOSIS — Z833 Family history of diabetes mellitus: Secondary | ICD-10-CM | POA: Diagnosis not present

## 2018-04-25 DIAGNOSIS — Z7984 Long term (current) use of oral hypoglycemic drugs: Secondary | ICD-10-CM | POA: Diagnosis not present

## 2018-04-25 DIAGNOSIS — E785 Hyperlipidemia, unspecified: Secondary | ICD-10-CM | POA: Diagnosis not present

## 2018-04-25 DIAGNOSIS — N529 Male erectile dysfunction, unspecified: Secondary | ICD-10-CM | POA: Diagnosis not present

## 2018-04-25 DIAGNOSIS — Z7982 Long term (current) use of aspirin: Secondary | ICD-10-CM | POA: Diagnosis not present

## 2018-06-29 ENCOUNTER — Other Ambulatory Visit: Payer: Self-pay | Admitting: Family Medicine

## 2018-06-29 DIAGNOSIS — Z125 Encounter for screening for malignant neoplasm of prostate: Secondary | ICD-10-CM

## 2018-06-29 DIAGNOSIS — E1149 Type 2 diabetes mellitus with other diabetic neurological complication: Secondary | ICD-10-CM

## 2018-06-30 ENCOUNTER — Other Ambulatory Visit (INDEPENDENT_AMBULATORY_CARE_PROVIDER_SITE_OTHER): Payer: Medicare HMO

## 2018-06-30 DIAGNOSIS — E1149 Type 2 diabetes mellitus with other diabetic neurological complication: Secondary | ICD-10-CM | POA: Diagnosis not present

## 2018-06-30 DIAGNOSIS — Z125 Encounter for screening for malignant neoplasm of prostate: Secondary | ICD-10-CM | POA: Diagnosis not present

## 2018-06-30 LAB — COMPREHENSIVE METABOLIC PANEL
ALT: 33 U/L (ref 0–53)
AST: 21 U/L (ref 0–37)
Albumin: 4.4 g/dL (ref 3.5–5.2)
Alkaline Phosphatase: 50 U/L (ref 39–117)
BUN: 17 mg/dL (ref 6–23)
CALCIUM: 9.2 mg/dL (ref 8.4–10.5)
CHLORIDE: 100 meq/L (ref 96–112)
CO2: 30 meq/L (ref 19–32)
CREATININE: 0.89 mg/dL (ref 0.40–1.50)
GFR: 91.5 mL/min (ref >60.00)
Glucose, Bld: 140 mg/dL — ABNORMAL HIGH (ref 70–99)
POTASSIUM: 4.7 meq/L (ref 3.5–5.1)
SODIUM: 138 meq/L (ref 135–145)
Total Bilirubin: 1.1 mg/dL (ref 0.2–1.2)
Total Protein: 7.3 g/dL (ref 6.0–8.3)

## 2018-06-30 LAB — LIPID PANEL
CHOL/HDL RATIO: 2
Cholesterol: 141 mg/dL (ref 0–200)
HDL: 59.1 mg/dL (ref >39.00)
LDL CALC: 70 mg/dL (ref 0–99)
NonHDL: 82.08
Triglycerides: 58 mg/dL (ref 0.0–149.0)
VLDL: 11.6 mg/dL (ref 0.0–40.0)

## 2018-06-30 LAB — PSA: PSA: 0.37 ng/mL (ref 0.10–4.00)

## 2018-06-30 LAB — HEMOGLOBIN A1C: Hgb A1c MFr Bld: 6.5 % (ref 4.6–6.5)

## 2018-07-05 ENCOUNTER — Other Ambulatory Visit: Payer: Self-pay | Admitting: Family Medicine

## 2018-07-05 ENCOUNTER — Ambulatory Visit (INDEPENDENT_AMBULATORY_CARE_PROVIDER_SITE_OTHER): Payer: Medicare HMO | Admitting: Family Medicine

## 2018-07-05 ENCOUNTER — Encounter: Payer: Self-pay | Admitting: Family Medicine

## 2018-07-05 VITALS — BP 120/72 | HR 64 | Temp 98.5°F | Ht 70.0 in | Wt 185.8 lb

## 2018-07-05 DIAGNOSIS — Z7189 Other specified counseling: Secondary | ICD-10-CM

## 2018-07-05 DIAGNOSIS — M79609 Pain in unspecified limb: Secondary | ICD-10-CM | POA: Diagnosis not present

## 2018-07-05 DIAGNOSIS — E1149 Type 2 diabetes mellitus with other diabetic neurological complication: Secondary | ICD-10-CM

## 2018-07-05 DIAGNOSIS — R69 Illness, unspecified: Secondary | ICD-10-CM | POA: Diagnosis not present

## 2018-07-05 DIAGNOSIS — I10 Essential (primary) hypertension: Secondary | ICD-10-CM

## 2018-07-05 DIAGNOSIS — Z8042 Family history of malignant neoplasm of prostate: Secondary | ICD-10-CM

## 2018-07-05 DIAGNOSIS — E785 Hyperlipidemia, unspecified: Secondary | ICD-10-CM | POA: Diagnosis not present

## 2018-07-05 MED ORDER — PREGABALIN 75 MG PO CAPS: ORAL_CAPSULE | ORAL | 1 refills | Status: DC

## 2018-07-05 MED ORDER — GLUCOSE BLOOD VI STRP: ORAL_STRIP | 3 refills | Status: DC

## 2018-07-05 NOTE — Assessment & Plan Note (Signed)
Would have wife designated if patient were incapacitated.

## 2018-07-05 NOTE — Patient Instructions (Addendum)
Let us know which pharmacy you are using and we can send refills when needed.   Recheck in about 3-4 months.  The only lab you need to have done for your next diabetic visit is an A1c.  We can do this with a fingerstick test at the office visit.  You do not need a lab visit ahead of time for this.  It does not matter if you are fasting when the lab is done.   Take care.  Glad to see you.  Update [place] as needed.

## 2018-07-05 NOTE — Assessment & Plan Note (Signed)
A1c stable, d/w pt about foot cautions.  See above.  No change in meds.  Recheck in about 3-4 months.  He agrees.  Update me as needed.  >25 minutes spent in face to face time with patient, >50% spent in counselling or coordination of care.

## 2018-07-05 NOTE — Progress Notes (Signed)
Diabetes:  Using medications without difficulties: yes Hypoglycemic episodes: only if prolonged fasting, cautions d/w pt.  He keeps an emergency piece of candy with him.   Hyperglycemic episodes: no Feet problems: still with tingling and burning at baseline.  Cautions d/w pt.  Blood Sugars averaging: 130s eye exam within last year: yes A1c at goal, d/w pt.    PSA wnl.  D/w pt.   Would have wife designated if patient were incapacitated.   Other health maint items up to date.   D/w pt.   His R > L plantar fasciitis pain is better with stretching and inserts.    Hypertension:    Using medication without problems or lightheadedness: yes Chest pain with exertion:no Edema:no Short of breath:no He felt better on current dose of BP meds.  If any lightheadedness, then he'll update me.    Elevated Cholesterol: Using medications without problems: yes Muscle aches: likely some aches statin, d/w pt about options.  Has been taking 5 days a week usually but occ skipping a day if needed due to more aches.  He wanted to continue as is, since he is likely on the max tolerated dose and he adjusts as needed.   Diet compliance: yes Exercise: as tolerated.   Labs d/w pt.   Chronic back pain.  He is using lyrica as is, BID, able to tolerate that.  Still on etodolac.  He tried med taper and didn't do well, with more pain.  Able to tolerate current meds as is and is reasonably function as is, but is still out of work.  Discussed options.  Likely reasonable to continue as is.    ED.  Treated with levitra.  No ADE on med other than HA.  It helps some o/w.  Other meds didn't work much better.  Not NTG use.    PMH and SH reviewed.   Vital signs, Meds and allergies reviewed.  ROS: Per HPI unless specifically indicated in ROS section   GEN: nad, alert and oriented HEENT: mucous membranes moist, R pupil reactive, L eye with chronic changes noted at baseline.  NECK: supple w/o LA CV: rrr. PULM: ctab, no  inc wob ABD: soft, +bs EXT: no edema SKIN: no acute rash  Diabetic foot exam: Normal inspection No skin breakdown No calluses  Normal DP pulses Dec sensation to light tough and monofilament Nails normal

## 2018-07-05 NOTE — Assessment & Plan Note (Signed)
Would continue lyrica as is.  He is doing home stretching program for plantar fasciitis.  Update me as needed.  He agrees.

## 2018-07-05 NOTE — Assessment & Plan Note (Signed)
Reasonable control, would LFTs and Cr yearly, d/w pt about normal labs and rationale.  He agrees and understood.  No change in meds at this point- he is adjusting dosing of statin based on aches, to max tolerated dose, with occ skipping a few days.  This is reasonable.

## 2018-07-05 NOTE — Assessment & Plan Note (Signed)
PSA wnl.  D/w pt.

## 2018-07-05 NOTE — Assessment & Plan Note (Signed)
Reasonable control, would check LFTs and Cr yearly, d/w pt about normal labs and rationale.  He agrees and understood.  No change in meds.

## 2018-08-15 ENCOUNTER — Encounter: Payer: Self-pay | Admitting: Family Medicine

## 2018-08-15 DIAGNOSIS — H2513 Age-related nuclear cataract, bilateral: Secondary | ICD-10-CM | POA: Diagnosis not present

## 2018-08-15 LAB — HM DIABETES EYE EXAM

## 2018-09-12 ENCOUNTER — Other Ambulatory Visit: Payer: Self-pay | Admitting: Family Medicine

## 2018-09-12 DIAGNOSIS — R69 Illness, unspecified: Secondary | ICD-10-CM | POA: Diagnosis not present

## 2018-09-12 NOTE — Telephone Encounter (Signed)
Electronic refill request. Lyrica Last office visit:   07/05/18 DM Last Filled:    180 capsule 1 07/05/2018  Please advise.

## 2018-09-13 NOTE — Telephone Encounter (Signed)
Sent. Thanks.   

## 2018-10-10 ENCOUNTER — Ambulatory Visit (INDEPENDENT_AMBULATORY_CARE_PROVIDER_SITE_OTHER): Payer: Medicare HMO | Admitting: Family Medicine

## 2018-10-10 ENCOUNTER — Encounter: Payer: Self-pay | Admitting: Family Medicine

## 2018-10-10 VITALS — BP 122/60 | HR 87 | Temp 97.8°F | Ht 70.0 in | Wt 189.5 lb

## 2018-10-10 DIAGNOSIS — E119 Type 2 diabetes mellitus without complications: Secondary | ICD-10-CM | POA: Diagnosis not present

## 2018-10-10 DIAGNOSIS — I10 Essential (primary) hypertension: Secondary | ICD-10-CM

## 2018-10-10 DIAGNOSIS — Z23 Encounter for immunization: Secondary | ICD-10-CM | POA: Diagnosis not present

## 2018-10-10 DIAGNOSIS — E1149 Type 2 diabetes mellitus with other diabetic neurological complication: Secondary | ICD-10-CM | POA: Diagnosis not present

## 2018-10-10 LAB — POCT GLYCOSYLATED HEMOGLOBIN (HGB A1C): Hemoglobin A1C: 5.9 % — AB (ref 4.0–5.6)

## 2018-10-10 MED ORDER — ETODOLAC 500 MG PO TABS: 500.0000 mg | ORAL_TABLET | Freq: Two times a day (BID) | ORAL | 3 refills | Status: DC

## 2018-10-10 MED ORDER — GLIMEPIRIDE 2 MG PO TABS: 2.0000 mg | ORAL_TABLET | Freq: Every day | ORAL | 3 refills | Status: DC

## 2018-10-10 MED ORDER — ONDANSETRON HCL 4 MG PO TABS: 4.0000 mg | ORAL_TABLET | Freq: Three times a day (TID) | ORAL | 5 refills | Status: DC | PRN

## 2018-10-10 MED ORDER — ROSUVASTATIN CALCIUM 10 MG PO TABS: ORAL_TABLET | ORAL | 3 refills | Status: DC

## 2018-10-10 MED ORDER — PIOGLITAZONE HCL 30 MG PO TABS: 30.0000 mg | ORAL_TABLET | Freq: Every day | ORAL | 3 refills | Status: DC

## 2018-10-10 MED ORDER — LOSARTAN POTASSIUM 25 MG PO TABS: 25.0000 mg | ORAL_TABLET | Freq: Every day | ORAL | 3 refills | Status: DC

## 2018-10-10 MED ORDER — VARDENAFIL HCL 10 MG PO TABS: 10.0000 mg | ORAL_TABLET | Freq: Every day | ORAL | 3 refills | Status: DC | PRN

## 2018-10-10 MED ORDER — METFORMIN HCL 500 MG PO TABS: 500.0000 mg | ORAL_TABLET | Freq: Two times a day (BID) | ORAL | 3 refills | Status: DC

## 2018-10-10 MED ORDER — OMEPRAZOLE 20 MG PO CPDR: 20.0000 mg | DELAYED_RELEASE_CAPSULE | Freq: Two times a day (BID) | ORAL | 3 refills | Status: DC

## 2018-10-10 NOTE — Patient Instructions (Addendum)
Cut back on the glimepiride in the meantime and see if you can tolerate that.   Take a 1/2 tab for now and see if that works.  If your sugar is too high, then go back to 1 tab a day.   Thank you for your effort.   Plan on recheck in about 3-4 months like today.  The only lab you need to have done for your next diabetic visit is an A1c.  We can do this with a fingerstick test at the office visit.  You do not need a lab visit ahead of time for this.  It does not matter if you are fasting when the lab is done.   Take care.  Glad to see you.

## 2018-10-10 NOTE — Progress Notes (Signed)
Diabetes:  Using medications without difficulties:yes Hypoglycemic episodes: if prolonged fasting.   Cautions d/w pt.   Hyperglycemic episodes:no Feet problems: yes, still putting up with pain from neuropathy with current lyrica dose.  Blood Sugars averaging: ~130s  A1c done at [facility].  D/w pt, 5.9.    We talked about cutting back on his glimepiride given his A1c.    He still has some GERD sx.  He has been eating more fruit.  Still on PPI.  He'll call about GI follow up.    PMH and SH reviewed Meds, vitals, and allergies reviewed.   ROS: Per HPI unless specifically indicated in ROS section   GEN: nad, alert and oriented HEENT: mucous membranes moist NECK: supple w/o LA CV: rrr. PULM: ctab, no inc wob ABD: soft, +bs EXT: no edema SKIN: no acute rash

## 2018-10-11 NOTE — Assessment & Plan Note (Signed)
Feet problems: yes, still putting up with pain from neuropathy with current lyrica dose.  Routine cautions given.  Discussed routine foot care. Blood Sugars averaging: ~130s   A1c done at OV.  D/w pt, 5.9.    No change in medications at this point other than trying to cut back on glimepiride.  He can take a half a tablet a day for now.  If he has elevated blood sugars then he can increase back to 1 full tab a day.  Recheck periodically.  See after visit summary.  He agrees.

## 2018-10-15 ENCOUNTER — Telehealth: Payer: Self-pay | Admitting: Family Medicine

## 2018-10-15 NOTE — Telephone Encounter (Signed)
Please call patient.  He was asking about lion's mane mushroom supplement.  I looked into this.  It is used as a supplement but I do not have any verified independent data that it is actually useful.  Due to lack of sufficient evidence, I cannot recommend using it.  I will defer to him.  Thanks.

## 2018-10-16 NOTE — Telephone Encounter (Signed)
Patient advised.

## 2018-10-20 DIAGNOSIS — R69 Illness, unspecified: Secondary | ICD-10-CM | POA: Diagnosis not present

## 2018-12-12 DIAGNOSIS — D2271 Melanocytic nevi of right lower limb, including hip: Secondary | ICD-10-CM | POA: Diagnosis not present

## 2018-12-12 DIAGNOSIS — L821 Other seborrheic keratosis: Secondary | ICD-10-CM | POA: Diagnosis not present

## 2018-12-12 DIAGNOSIS — D2272 Melanocytic nevi of left lower limb, including hip: Secondary | ICD-10-CM | POA: Diagnosis not present

## 2018-12-12 DIAGNOSIS — Z85828 Personal history of other malignant neoplasm of skin: Secondary | ICD-10-CM | POA: Diagnosis not present

## 2018-12-12 DIAGNOSIS — D1801 Hemangioma of skin and subcutaneous tissue: Secondary | ICD-10-CM | POA: Diagnosis not present

## 2018-12-12 DIAGNOSIS — D225 Melanocytic nevi of trunk: Secondary | ICD-10-CM | POA: Diagnosis not present

## 2018-12-12 DIAGNOSIS — L918 Other hypertrophic disorders of the skin: Secondary | ICD-10-CM | POA: Diagnosis not present

## 2018-12-12 DIAGNOSIS — L812 Freckles: Secondary | ICD-10-CM | POA: Diagnosis not present

## 2019-03-01 ENCOUNTER — Encounter: Payer: Self-pay | Admitting: Family Medicine

## 2019-03-06 ENCOUNTER — Other Ambulatory Visit: Payer: Self-pay | Admitting: Family Medicine

## 2019-03-06 NOTE — Telephone Encounter (Signed)
Electronic refill request Lyrica Last refill 09/13/18 #180/1 Last office visit 10/10/18

## 2019-03-07 NOTE — Telephone Encounter (Signed)
Sent. Thanks.   

## 2019-05-02 ENCOUNTER — Other Ambulatory Visit: Payer: Self-pay | Admitting: Family Medicine

## 2019-05-02 DIAGNOSIS — E119 Type 2 diabetes mellitus without complications: Secondary | ICD-10-CM

## 2019-05-02 DIAGNOSIS — Z125 Encounter for screening for malignant neoplasm of prostate: Secondary | ICD-10-CM

## 2019-05-04 ENCOUNTER — Other Ambulatory Visit: Payer: Self-pay

## 2019-05-04 ENCOUNTER — Other Ambulatory Visit (INDEPENDENT_AMBULATORY_CARE_PROVIDER_SITE_OTHER): Payer: Medicare Other

## 2019-05-04 ENCOUNTER — Ambulatory Visit (INDEPENDENT_AMBULATORY_CARE_PROVIDER_SITE_OTHER): Payer: Medicare Other

## 2019-05-04 DIAGNOSIS — E119 Type 2 diabetes mellitus without complications: Secondary | ICD-10-CM

## 2019-05-04 DIAGNOSIS — Z0000 Encounter for general adult medical examination without abnormal findings: Secondary | ICD-10-CM

## 2019-05-04 DIAGNOSIS — Z125 Encounter for screening for malignant neoplasm of prostate: Secondary | ICD-10-CM

## 2019-05-04 LAB — COMPREHENSIVE METABOLIC PANEL
ALT: 20 U/L (ref 0–53)
AST: 18 U/L (ref 0–37)
Albumin: 4.3 g/dL (ref 3.5–5.2)
Alkaline Phosphatase: 41 U/L (ref 39–117)
BUN: 16 mg/dL (ref 6–23)
CO2: 30 mEq/L (ref 19–32)
Calcium: 9.4 mg/dL (ref 8.4–10.5)
Chloride: 102 mEq/L (ref 96–112)
Creatinine, Ser: 0.96 mg/dL (ref 0.40–1.50)
GFR: 78.67 mL/min (ref >60.00)
Glucose, Bld: 140 mg/dL — ABNORMAL HIGH (ref 70–99)
Potassium: 5.2 mEq/L — ABNORMAL HIGH (ref 3.5–5.1)
Sodium: 139 mEq/L (ref 135–145)
Total Bilirubin: 1 mg/dL (ref 0.2–1.2)
Total Protein: 7.1 g/dL (ref 6.0–8.3)

## 2019-05-04 LAB — LIPID PANEL
Cholesterol: 155 mg/dL (ref 0–200)
HDL: 50.2 mg/dL (ref >39.00)
LDL Cholesterol: 83 mg/dL (ref 0–99)
NonHDL: 104.89
Total CHOL/HDL Ratio: 3
Triglycerides: 107 mg/dL (ref 0.0–149.0)
VLDL: 21.4 mg/dL (ref 0.0–40.0)

## 2019-05-04 LAB — PSA, MEDICARE: PSA: 0.33 ng/ml (ref 0.10–4.00)

## 2019-05-04 LAB — HEMOGLOBIN A1C: Hgb A1c MFr Bld: 6.8 % — ABNORMAL HIGH (ref 4.6–6.5)

## 2019-05-04 NOTE — Progress Notes (Signed)
Subjective:   Ian Johnson is a 63 y.o. male who presents for Medicare Annual/Subsequent preventive examination.  Review of Systems:  N/A Cardiac Risk Factors include: advanced age (>65men, >73 women);diabetes mellitus;male gender;hypertension;dyslipidemia     Objective:    Vitals: There were no vitals taken for this visit.  There is no height or weight on file to calculate BMI.  Advanced Directives 05/04/2019 06/22/2016 06/22/2016 11/06/2013 01/15/2013  Does Patient Have a Medical Advance Directive? No No No Patient does not have advance directive;Patient would not like information Patient does not have advance directive  Would patient like information on creating a medical advance directive? No - Patient declined Yes - Scientist, clinical (histocompatibility and immunogenetics) given Yes - Scientist, clinical (histocompatibility and immunogenetics) given - -    Tobacco Social History   Tobacco Use  Smoking Status Never Smoker  Smokeless Tobacco Never Used     Counseling given: No   Clinical Intake:  Pre-visit preparation completed: Yes  Pain : No/denies pain Pain Score: 0-No pain     Nutritional Status: BMI 25 -29 Overweight Nutritional Risks: None Diabetes: Yes CBG done?: No(patient supplied - AM blood glucose 134) Did pt. bring in CBG monitor from home?: No  How often do you need to have someone help you when you read instructions, pamphlets, or other written materials from your doctor or pharmacy?: 1 - Never What is the last grade level you completed in school?: 12th grade  Interpreter Needed?: No  Comments: pt lives with spouse Information entered by :: LPinson, LPN  Past Medical History:  Diagnosis Date  . Adhesive capsulitis of right shoulder 11/09/2013  . Arthritis   . Backache, unspecified   . 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 Complex tear of medial meniscus of left knee as current injury 01/19/2013  . Diabetes mellitus without mention of complication   . Esophageal reflux   . Nonspecific elevation of levels of  transaminase or lactic acid dehydrogenase (LDH)   . Other and unspecified hyperlipidemia   . Peripheral neuropathy   . Personal history of urinary calculi   . Unspecified essential hypertension    no meds now  . Wears glasses    Past Surgical History:  Procedure Laterality Date  . CHONDROPLASTY Left 01/19/2013   Procedure: CHONDROPLASTY;  Surgeon: Johnny Bridge, MD;  Location: [facility];  Service: Orthopedics;  Laterality: Left;  ARTHROSCOPY KNEE WITH DEBRIDEMENT/SHAVING (CHONDROPLASTY)  . COLONOSCOPY    . EYE SURGERY  [phone]   multiple lt eye reconstruction surg -glass eye  . KNEE ARTHROSCOPY W/ MENISCAL REPAIR Right    2016  . KNEE ARTHROSCOPY WITH MEDIAL MENISECTOMY Left 01/19/2013   Procedure: KNEE ARTHROSCOPY WITH MEDIAL MENISECTOMY;  Surgeon: Johnny Bridge, MD;  Location: [facility];  Service: Orthopedics;  Laterality: Left;  LEFT KNEE SCOPE WITH DEBRIDEMENT AND partial MEDIAL MENISECTOMY  . LUMBAR DISC SURGERY  1990   l4 and l5  . SHOULDER ARTHROSCOPY WITH ROTATOR CUFF REPAIR AND SUBACROMIAL DECOMPRESSION Right 11/09/2013   Procedure: RIGHT SHOULDER ARTHROSCOPY WITH DEBRIDEMENT OF PARTIAL ROTATOR CUFF TEAR AND SUBACROMIAL DECOMPRESSION ;  Surgeon: Johnny Bridge, MD;  Location: [facility];  Service: Orthopedics;  Laterality: Right;  . UPPER GI ENDOSCOPY     Family History  Problem Relation Age of Onset  . Coronary artery disease Mother   . Coronary artery disease Father   . Peripheral vascular disease Father   . Prostate cancer Father   .  Colon cancer Neg Hx    Social History   Socioeconomic History  . Marital status: Married    Spouse name: Not on file  . Number of children: 2  . Years of education: 44  . Highest education level: Not on file  Occupational History  . Occupation: Clinical research associate  Social Needs  . Financial resource strain: Not on file  . Food insecurity:    Worry: Not on file    Inability: Not on  file  . Transportation needs:    Medical: Not on file    Non-medical: Not on file  Tobacco Use  . Smoking status: Never Smoker  . Smokeless tobacco: Never Used  Substance and Sexual Activity  . Alcohol use: No    Alcohol/week: 0.0 standard drinks  . Drug use: No  . Sexual activity: Yes    Partners: Female  Lifestyle  . Physical activity:    Days per week: Not on file    Minutes per session: Not on file  . Stress: Not on file  Relationships  . Social connections:    Talks on phone: Not on file    Gets together: Not on file    Attends religious service: Not on file    Active member of club or organization: Not on file    Attends meetings of clubs or organizations: Not on file    Relationship status: Not on file  Other Topics Concern  . Not on file  Social History Narrative   HSG. married 7 years - divorced; remarried 1993. 1 biological child and 2 step kids.    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     Outpatient Encounter Medications as of 05/04/2019  Medication Sig  . Alpha Lipoic Acid 200 MG CAPS Take 200 mg by mouth daily.  . Ascorbic Acid (VITAMIN C) 500 MG CAPS Take 1 each by mouth daily.  Marland Kitchen aspirin 81 MG EC tablet Take 81 mg by mouth daily.    . Coenzyme Q10 (CO Q-10) 100 MG CAPS Take 200 mg by mouth daily.    Marland Kitchen etodolac (LODINE) 500 MG tablet Take 1 tablet (500 mg total) by mouth 2 (two) times daily.  . fish oil-omega-3 fatty acids 1000 MG capsule Take 2 g by mouth daily. 1200 mg daily.  . Ginkgo Biloba 60 MG CAPS Take 1 each by mouth daily.  Marland Kitchen glimepiride (AMARYL) 2 MG tablet Take 1 tablet (2 mg total) by mouth daily.  Marland Kitchen glucose blood (ONETOUCH VERIO) test strip Use as needed check blood sugar twice daily as needed. Dx E11.9 w/o insulin, with history of fluctuating sugar/hypoglycemic symptoms.  Marland Kitchen losartan (COZAAR) 25 MG tablet Take 1 tablet (25 mg total) by mouth daily.  . metFORMIN (GLUCOPHAGE) 500 MG tablet Take 1 tablet  (500 mg total) by mouth 2 (two) times daily with a meal.  . Multiple Vitamin (MULTIVITAMIN) tablet Take 1 tablet by mouth daily.    Marland Kitchen omeprazole (PRILOSEC) 20 MG capsule Take 1 capsule (20 mg total) by mouth 2 (two) times daily.  . ondansetron (ZOFRAN) 4 MG tablet Take 1 tablet (4 mg total) by mouth every 8 (eight) hours as needed for nausea or vomiting.  . pioglitazone (ACTOS) 30 MG tablet Take 1 tablet (30 mg total) by mouth daily.  . pregabalin (LYRICA) 75 MG capsule Take 1 capsule by mouth twice daily  . rosuvastatin (CRESTOR) 10 MG tablet Take 1 tab 5 times a week.  Marland Kitchen  vardenafil (LEVITRA) 10 MG tablet Take 1 tablet (10 mg total) by mouth daily as needed for erectile dysfunction.   No facility-administered encounter medications on file as of 05/04/2019.     Activities of Daily Living In your present state of health, do you have any difficulty performing the following activities: 05/04/2019  Hearing? N  Vision? N  Difficulty concentrating or making decisions? N  Walking or climbing stairs? N  Dressing or bathing? N  Doing errands, shopping? N  Preparing Food and eating ? N  Using the Toilet? N  In the past six months, have you accidently leaked urine? N  Do you have problems with loss of bowel control? N  Managing your Medications? N  Managing your Finances? N  Housekeeping or managing your Housekeeping? N  Some recent data might be hidden    Patient Care Team: Tonia Ghent, MD as PCP - General (Family Medicine) Lyla Glassing, MD as Referring Physician (Ophthalmology) Marchia Bond, MD as Consulting Physician (Orthopedic Surgery) Jarome Matin, MD as Consulting Physician (Dermatology) Solon Augusta, MD as Attending Physician (Ophthalmology) Laurence Spates, MD as Consulting Physician (Gastroenterology)   Assessment:   This is a routine wellness examination for Ian Johnson.  Vision Screening Comments: Vision exam in 2019 with Dr. Sandra Cockayne  Exercise Activities and  Dietary recommendations Current Exercise Habits: Home exercise routine, Type of exercise: treadmill;Other - see comments;strength training/weights(recumbent bike), Time (Minutes): 60, Frequency (Times/Week): 3, Weekly Exercise (Minutes/Week): 180, Intensity: Moderate, Exercise limited by: None identified  Goals    . Increase physical activity     Starting 05/04/19, I will continue to exercise for at least 60 min 3 days per week.        Fall Risk Fall Risk  05/04/2019 06/28/2017 06/22/2016 06/22/2016 06/17/2015  Falls in the past year? 0 No No No No    Depression Screen PHQ 2/9 Scores 05/04/2019 07/05/2018 06/28/2017 06/22/2016  PHQ - 2 Score 0 0 0 0  PHQ- 9 Score 0 - - -    Cognitive Function MMSE - Mini Mental State Exam 05/04/2019 06/22/2016  Orientation to time 5 5  Orientation to Place 5 5  Registration 3 3  Attention/ Calculation 0 0  Recall 3 3  Language- name 2 objects 0 0  Language- repeat 1 1  Language- follow 3 step command 0 3  Language- read & follow direction 0 0  Write a sentence 0 0  Copy design 0 0  Total score 17 20       PLEASE NOTE: A Mini-Cog screen was completed. Maximum score is 17. A value of 0 denotes this part of Folstein MMSE was not completed or the patient failed this part of the Mini-Cog screening.   Mini-Cog Screening Orientation to Time - Max 5 pts Orientation to Place - Max 5 pts Registration - Max 3 pts Recall - Max 3 pts Language Repeat - Max 1 pts    Immunization History  Administered Date(s) Administered  . Influenza Split 10/15/2011, 10/09/2012  . Influenza Whole 09/28/2002, 12/05/2007, 10/22/2008, 10/15/2010  . Influenza,inj,Quad PF,6+ Mos 10/23/2013, 10/21/2014, 10/20/2015, 10/26/2016, 10/20/2017, 10/10/2018  . Pneumococcal Conjugate-13 11/13/2013  . Pneumococcal Polysaccharide-23 01/08/2014  . Td 06/01/2006  . Tdap 11/24/2017  . Zoster 11/26/2014    Screening Tests Health Maintenance  Topic Date Due  . FOOT EXAM  07/06/2019   . INFLUENZA VACCINE  07/07/2019  . OPHTHALMOLOGY EXAM  08/16/2019  . HEMOGLOBIN A1C  11/04/2019  . COLONOSCOPY  05/15/2020  . TETANUS/TDAP  11/25/2027  . Hepatitis C Screening  Completed  . HIV Screening  Completed       Plan:     I have personally reviewed, addressed, and noted the following in the patient's chart:  A. Medical and social history B. Use of alcohol, tobacco or illicit drugs  C. Current medications and supplements D. Functional ability and status E.  Nutritional status F.  Physical activity G. Advance directives H. List of other physicians I.  Hospitalizations, surgeries, and ER visits in previous 12 months J.  Vitals (unless it is a telemedicine encounter) K. Screenings to include cognitive, depression, hearing, vision (NOTE: hearing and vision screenings not completed in telemedicine encounter) L. Referrals and appointments   In addition, I have reviewed and discussed with patient certain preventive protocols, quality metrics, and best practice recommendations. A written personalized care plan for preventive services and recommendations were provided to patient.  With patient's permission, we connected on 05/04/19 at  9:30 AM EDT by a video enabled telemedicine application. Two patient identifiers were used to ensure the encounter occurred with the correct person.    Patient was in home and writer was in office.   Signed,   Lindell Noe, MHA, BS, LPN Health Coach

## 2019-05-04 NOTE — Progress Notes (Signed)
PCP notes:   Health maintenance:  A1C - completed today  Abnormal screenings:   None  Patient concerns:   None  Nurse concerns:  None  Next PCP appt:   05/08/19 @ 1200  I reviewed health advisor's note, was available for consultation on the day of service listed in this note, and agree with documentation and plan. Elsie Stain, MD.

## 2019-05-04 NOTE — Patient Instructions (Signed)
Mr. Rawlins , Thank you for taking time to come for your Medicare Wellness Visit. I appreciate your ongoing commitment to your health goals. Please review the following plan we discussed and let me know if I can assist you in the future.   These are the goals we discussed: Goals    . Increase physical activity     Starting 05/04/19, I will continue to exercise for at least 60 min 3 days per week.        This is a list of the screening recommended for you and due dates:  Health Maintenance  Topic Date Due  . Complete foot exam   07/06/2019  . Flu Shot  07/07/2019  . Eye exam for diabetics  08/16/2019  . Hemoglobin A1C  11/04/2019  . Colon Cancer Screening  05/15/2020  . Tetanus Vaccine  11/25/2027  .  Hepatitis C: One time screening is recommended by Center for Disease Control  (CDC) for  adults born from [birth year] through 1967.   Completed  . HIV Screening  Completed   Preventive Care for Adults  A healthy lifestyle and preventive care can promote health and wellness. Preventive health guidelines for adults include the following key practices.  . A routine yearly physical is a good way to check with your health care provider about your health and preventive screening. It is a chance to share any concerns and updates on your health and to receive a thorough exam.  . Visit your dentist for a routine exam and preventive care every 6 months. Brush your teeth twice a day and floss once a day. Good oral hygiene prevents tooth decay and gum disease.  . The frequency of eye exams is based on your age, health, family medical history, use  of contact lenses, and other factors. Follow your health care provider's recommendations for frequency of eye exams.  . Eat a healthy diet. Foods like vegetables, fruits, whole grains, low-fat dairy products, and lean protein foods contain the nutrients you need without too many calories. Decrease your intake of foods high in solid fats, added sugars, and salt.  Eat the right amount of calories for you. Get information about a proper diet from your health care provider, if necessary.  . Regular physical exercise is one of the most important things you can do for your health. Most adults should get at least 150 minutes of moderate-intensity exercise (any activity that increases your heart rate and causes you to sweat) each week. In addition, most adults need muscle-strengthening exercises on 2 or more days a week.  Silver Sneakers may be a benefit available to you. To determine eligibility, you may visit the website: www.silversneakers.com or contact program at (817) 880-9064 Mon-Fri between 8AM-8PM.   . Maintain a healthy weight. The body mass index (BMI) is a screening tool to identify possible weight problems. It provides an estimate of body fat based on height and weight. Your health care provider can find your BMI and can help you achieve or maintain a healthy weight.   For adults 20 years and older: ? A BMI below 18.5 is considered underweight. ? A BMI of 18.5 to 24.9 is normal. ? A BMI of 25 to 29.9 is considered overweight. ? A BMI of 30 and above is considered obese.   . Maintain normal blood lipids and cholesterol levels by exercising and minimizing your intake of saturated fat. Eat a balanced diet with plenty of fruit and vegetables. Blood tests for lipids and cholesterol should  begin at age 87 and be repeated every 5 years. If your lipid or cholesterol levels are high, you are over 50, or you are at high risk for heart disease, you may need your cholesterol levels checked more frequently. Ongoing high lipid and cholesterol levels should be treated with medicines if diet and exercise are not working.  . If you smoke, find out from your health care provider how to quit. If you do not use tobacco, please do not start.  . If you choose to drink alcohol, please do not consume more than 2 drinks per day. One drink is considered to be 12 ounces (355  mL) of beer, 5 ounces (148 mL) of wine, or 1.5 ounces (44 mL) of liquor.  . If you are 67-20 years old, ask your health care provider if you should take aspirin to prevent strokes.  . Use sunscreen. Apply sunscreen liberally and repeatedly throughout the day. You should seek shade when your shadow is shorter than you. Protect yourself by wearing long sleeves, pants, a wide-brimmed hat, and sunglasses year round, whenever you are outdoors.  . Once a month, do a whole body skin exam, using a mirror to look at the skin on your back. Tell your health care provider of new moles, moles that have irregular borders, moles that are larger than a pencil eraser, or moles that have changed in shape or color.

## 2019-05-08 ENCOUNTER — Encounter: Payer: Self-pay | Admitting: Family Medicine

## 2019-05-08 ENCOUNTER — Ambulatory Visit (INDEPENDENT_AMBULATORY_CARE_PROVIDER_SITE_OTHER): Payer: Medicare Other | Admitting: Family Medicine

## 2019-05-08 DIAGNOSIS — E785 Hyperlipidemia, unspecified: Secondary | ICD-10-CM

## 2019-05-08 DIAGNOSIS — E1149 Type 2 diabetes mellitus with other diabetic neurological complication: Secondary | ICD-10-CM | POA: Diagnosis not present

## 2019-05-08 DIAGNOSIS — Z7189 Other specified counseling: Secondary | ICD-10-CM

## 2019-05-08 DIAGNOSIS — Z0000 Encounter for general adult medical examination without abnormal findings: Secondary | ICD-10-CM

## 2019-05-08 DIAGNOSIS — I10 Essential (primary) hypertension: Secondary | ICD-10-CM | POA: Diagnosis not present

## 2019-05-08 DIAGNOSIS — E119 Type 2 diabetes mellitus without complications: Secondary | ICD-10-CM

## 2019-05-08 MED ORDER — PREGABALIN 75 MG PO CAPS: 75.0000 mg | ORAL_CAPSULE | Freq: Two times a day (BID) | ORAL | 1 refills | Status: DC

## 2019-05-08 NOTE — Progress Notes (Signed)
Virtual visit completed through WebEx or similar program Patient location: home  Provider location: Financial controller at [facility], office   Pandemic considerations d/w pt.  Cautions d/w pt.   Limitations and rationale for visit method d/w patient.  Patient agreed to proceed.   CC: f/u   HPI:  Flu 2019 Shingrix d/w pt. PNA up to date Tetanus 2018 Colonoscopy 2009- he'll call about f/u when possible.   Prostate cancer screening- PSA wnl Advance directive- wife designated if patient were incapacitated.  Diabetes:  Using medications without difficulties: yes Hypoglycemic episodes: rare, not unless prolonged fasting.  Cautions d/w pt. Hyperglycemic episodes: no Feet problems: neuropathy at baseline. "It's about the same."  He wanted to avoid extra medicine.   Blood Sugars averaging: usually ~135 eye exam within last year: yes A1c 6.8.  Up from prior but controlled.  Actos d/w pt.  Reasonable to continue for now, risk and benefits dw pt.    Elevated Cholesterol: Using medications without problems: yes Muscle aches: yes but some better with a little pickle juice at night.   Diet compliance: yes Exercise: at baseline, he is trying to be as active as much as possible.    Hypertension:    Using medication without problems or lightheadedness: yes Chest pain with exertion:no Edema:no Short of breath:no  PMH SH FH reviewed.   Meds and allergies reviewed.   ROS: Per HPI unless specifically indicated in ROS section   NAD Speech wnl  A/P:  Flu 2019 Shingrix d/w pt. PNA up to date Tetanus 2018 Colonoscopy 2009- he'll call about f/u when possible.   Prostate cancer screening- PSA wnl Advance directive- wife designated if patient were incapacitated.  Diabetes:  A1c 6.8.  Up from prior but controlled.  Actos d/w pt.  Reasonable to continue for now, risk and benefits dw pt.   No change in meds.   Needs recheck A1c in about 4 months. Hopefully to set q64month f/u after that.    If foot pain is worse in the meantime then he will let me know.  No change in meds for neuropathy at this point.  Elevated Cholesterol: Using medications without problems: yes Muscle aches: yes but some better with a little pickle juice at night.   Diet compliance: yes Exercise: at baseline, he is trying to be as active as much as possible.   Continue as is.  He agrees.  Hypertension:    No change in meds.  Compliant.  Continue as is.  He agrees.  Labs discussed with patient.

## 2019-05-09 ENCOUNTER — Other Ambulatory Visit: Payer: Self-pay | Admitting: Family Medicine

## 2019-05-09 DIAGNOSIS — Z0000 Encounter for general adult medical examination without abnormal findings: Secondary | ICD-10-CM | POA: Insufficient documentation

## 2019-05-09 NOTE — Assessment & Plan Note (Signed)
No change in meds.  Compliant.  Continue as is.  He agrees.  Labs discussed with patient.

## 2019-05-09 NOTE — Assessment & Plan Note (Signed)
Advance directive- wife designated if patient were incapacitated.  

## 2019-05-09 NOTE — Assessment & Plan Note (Signed)
Flu 2019 Shingrix d/w pt. PNA up to date Tetanus 2018 Colonoscopy 2009- he'll call about f/u when possible.   Prostate cancer screening- PSA wnl Advance directive- wife designated if patient were incapacitated.

## 2019-05-09 NOTE — Assessment & Plan Note (Signed)
Using medications without problems: yes Muscle aches: yes but some better with a little pickle juice at night.   Diet compliance: yes Exercise: at baseline, he is trying to be as active as much as possible.   Continue as is.  He agrees.

## 2019-05-09 NOTE — Assessment & Plan Note (Signed)
A1c 6.8.  Up from prior but controlled.  Actos d/w pt.  Reasonable to continue for now, risk and benefits dw pt.   No change in meds.   Needs recheck A1c in about 4 months. Hopefully to set q32month f/u after that.   If foot pain is worse in the meantime then he will let me know.  No change in meds for neuropathy at this point.

## 2019-05-10 ENCOUNTER — Telehealth: Payer: Self-pay | Admitting: Family Medicine

## 2019-05-10 NOTE — Telephone Encounter (Signed)
Lvm for pt to call us back and scheduled appt.   Message  Received: Yesterday  Message Contents  Tonia Ghent, MD  Genella Rife H        Needs recheck A1c in about 4 months with labs prior to visit.

## 2019-06-29 ENCOUNTER — Other Ambulatory Visit: Payer: Self-pay

## 2019-06-29 NOTE — Patient Outreach (Signed)
[facility]) Care Management  06/29/2019  Ian Johnson March 20, 1956 [id number]   Medication Adherence call to Ian Johnson [facility] Identifiers Verify spoke with patient he is past due on Rosuvastatin 10 mg patient explain he is only taking this medication 5 days a week 1 tablet daily and has plenty at this time patient explain he was having side effects patient ask doctor if he can only take it 5 days a week patient said doctor is aware. Ian Johnson is showing past due under [facility].   [facility] Management Direct Dial [phone]  Fax [phone] [email or web address]

## 2019-08-01 ENCOUNTER — Other Ambulatory Visit: Payer: Self-pay | Admitting: Family Medicine

## 2019-08-22 ENCOUNTER — Other Ambulatory Visit: Payer: Self-pay

## 2019-08-22 NOTE — Patient Outreach (Signed)
[facility]) Care Management  08/22/2019  AKIL RYBA [birth year] [id number]   Medication Adherence call to Mr. [place] Compliant Voice message left with a call back number.Mr. dechristopher is showing past due on Losartan 25 mg under [facility].   [facility] Management Direct Dial [phone]  Fax [phone] [email or web address]

## 2019-09-12 ENCOUNTER — Other Ambulatory Visit: Payer: Self-pay

## 2019-09-12 NOTE — Patient Outreach (Signed)
[facility]) Care Management  09/12/2019  JAMAS SEIGLE [birth year] [id number]   Medication Adherence call to Mr. [place] Compliant Voice message left with a call back number. Mr. Gayden is showing past due on Losartan 25 mg under [place].   [place] Management Direct Dial [phone]  Fax [phone] [email or web address]

## 2019-09-20 ENCOUNTER — Telehealth: Payer: Self-pay | Admitting: Family Medicine

## 2019-09-20 NOTE — Telephone Encounter (Signed)
Just A1c at the visit.  Doesn't need to fast.  Thanks.

## 2019-09-20 NOTE — Telephone Encounter (Signed)
Patient called to schedule follow up appointment for Diabetes follow up . He would like to know if you are wanting labs before his visit or if you wanted everything done at once.   Thanks!

## 2019-09-25 ENCOUNTER — Ambulatory Visit (INDEPENDENT_AMBULATORY_CARE_PROVIDER_SITE_OTHER): Payer: Medicare Other | Admitting: Family Medicine

## 2019-09-25 ENCOUNTER — Other Ambulatory Visit: Payer: Self-pay

## 2019-09-25 ENCOUNTER — Encounter: Payer: Self-pay | Admitting: Family Medicine

## 2019-09-25 VITALS — BP 122/66 | HR 59 | Temp 96.6°F | Ht 70.0 in | Wt 188.1 lb

## 2019-09-25 DIAGNOSIS — I10 Essential (primary) hypertension: Secondary | ICD-10-CM

## 2019-09-25 DIAGNOSIS — E119 Type 2 diabetes mellitus without complications: Secondary | ICD-10-CM | POA: Diagnosis not present

## 2019-09-25 DIAGNOSIS — E1149 Type 2 diabetes mellitus with other diabetic neurological complication: Secondary | ICD-10-CM | POA: Diagnosis not present

## 2019-09-25 DIAGNOSIS — Z23 Encounter for immunization: Secondary | ICD-10-CM

## 2019-09-25 LAB — POCT GLYCOSYLATED HEMOGLOBIN (HGB A1C): Hemoglobin A1C: 6.3 % — AB (ref 4.0–5.6)

## 2019-09-25 MED ORDER — GLIMEPIRIDE 2 MG PO TABS: 1.0000 mg | ORAL_TABLET | Freq: Every day | ORAL | Status: DC

## 2019-09-25 MED ORDER — LOSARTAN POTASSIUM 25 MG PO TABS: 12.5000 mg | ORAL_TABLET | Freq: Every day | ORAL | Status: DC

## 2019-09-25 NOTE — Progress Notes (Signed)
Diabetes:  Using medications without difficulties: yes Hypoglycemic episodes: if prolonged fasting.  Cautions d/w pt.  Hyperglycemic episodes:no Feet problems: neuropathy at baseline.  No changes per patient report.  Blood Sugars averaging: ~135 eye exam within last year: d/w pt.  Up to date per patient report.  [facility].  A1c done at the [facility].    Meds, vitals, and allergies reviewed.   ROS: Per HPI unless specifically indicated in ROS section   GEN: nad, alert and oriented HEENT: ncat NECK: supple w/o LA CV: rrr. PULM: ctab, no inc wob ABD: soft, +bs EXT: no edema SKIN: no acute rash  Diabetic foot exam: Normal inspection No skin breakdown No calluses  Normal DP pulses Dec sensation to light touch and monofilament Nails normal

## 2019-09-25 NOTE — Patient Instructions (Addendum)
Flu shot today.  Thanks for getting that done.  You can get the shingles and pneumonia shot later.  Recheck in about 4 months with A1c at the visit like today.  If you have trouble with low sugars, then stop the glimepiride.  Take care.  Glad to see you.

## 2019-09-27 MED ORDER — ROSUVASTATIN CALCIUM 10 MG PO TABS: ORAL_TABLET | ORAL | 3 refills | Status: DC

## 2019-09-27 MED ORDER — PREGABALIN 75 MG PO CAPS: 75.0000 mg | ORAL_CAPSULE | Freq: Two times a day (BID) | ORAL | 1 refills | Status: DC

## 2019-09-27 MED ORDER — ETODOLAC 500 MG PO TABS: 500.0000 mg | ORAL_TABLET | Freq: Two times a day (BID) | ORAL | 3 refills | Status: DC

## 2019-09-27 MED ORDER — VARDENAFIL HCL 10 MG PO TABS: 10.0000 mg | ORAL_TABLET | Freq: Every day | ORAL | 3 refills | Status: AC | PRN

## 2019-09-27 MED ORDER — ONDANSETRON HCL 4 MG PO TABS: 4.0000 mg | ORAL_TABLET | Freq: Three times a day (TID) | ORAL | 5 refills | Status: DC | PRN

## 2019-09-27 MED ORDER — LOSARTAN POTASSIUM 25 MG PO TABS: 12.5000 mg | ORAL_TABLET | Freq: Every day | ORAL | 3 refills | Status: DC

## 2019-09-27 MED ORDER — OMEPRAZOLE 20 MG PO CPDR: 20.0000 mg | DELAYED_RELEASE_CAPSULE | Freq: Two times a day (BID) | ORAL | 3 refills | Status: DC

## 2019-09-27 MED ORDER — PIOGLITAZONE HCL 30 MG PO TABS: 30.0000 mg | ORAL_TABLET | Freq: Every day | ORAL | 3 refills | Status: DC

## 2019-09-27 MED ORDER — METFORMIN HCL 500 MG PO TABS: 500.0000 mg | ORAL_TABLET | Freq: Two times a day (BID) | ORAL | 3 refills | Status: DC

## 2019-09-27 NOTE — Assessment & Plan Note (Signed)
Recheck in about 4 months with A1c at the visit.  If any more episodes with low sugars, then stop the glimepiride.  Discussed with patient.  He agrees.

## 2019-09-28 ENCOUNTER — Other Ambulatory Visit: Payer: Self-pay

## 2019-09-28 DIAGNOSIS — Z20822 Contact with and (suspected) exposure to covid-19: Secondary | ICD-10-CM

## 2019-09-30 ENCOUNTER — Encounter: Payer: Self-pay | Admitting: Family Medicine

## 2019-09-30 ENCOUNTER — Telehealth: Payer: Self-pay | Admitting: Family Medicine

## 2019-09-30 LAB — NOVEL CORONAVIRUS, NAA: SARS-CoV-2, NAA: DETECTED — AB

## 2019-09-30 NOTE — Telephone Encounter (Signed)
-----   Message from Tonia Ghent, MD sent at 09/30/2019 12:44 PM EDT ----- See below.  Please report and add him to the follow up list.  Thanks.  Brigitte Pulse ----- Message ----- From: Darreld Mclean, MD Sent: 09/30/2019  11:47 AM EDT To: Tonia Ghent, MD  Brigitte Pulse- can you please have your staff report his positive covid to the HD on Monday- TY

## 2019-09-30 NOTE — Telephone Encounter (Signed)
See below . Thanks

## 2019-10-01 NOTE — Telephone Encounter (Signed)
Reported to HD, added to follow up list.

## 2019-10-02 ENCOUNTER — Telehealth: Payer: Self-pay | Admitting: Family Medicine

## 2019-10-02 NOTE — Telephone Encounter (Signed)
Darlene,[facility],received a request for eye exam note for this year.  Patient's last visit was 08/15/18.  Patient didn't have an appointment this year.

## 2019-10-03 NOTE — Telephone Encounter (Signed)
I spoke to patient and he said he'd call and schedule eye appointment.

## 2019-10-03 NOTE — Telephone Encounter (Signed)
Noted.  I thought patient was up-to-date, according to his report.  Please have patient call about yearly eye exam if not already scheduled there.  Thanks.

## 2019-10-08 ENCOUNTER — Telehealth: Payer: Self-pay | Admitting: *Deleted

## 2019-10-08 NOTE — Telephone Encounter (Signed)
Noted.  Thanks.  Glad he is feeling better.

## 2019-10-08 NOTE — Telephone Encounter (Signed)
Checked on patient per + Covid test.  Patient says he is feeling fine now.  He completed his quarantine restrictions on Friday.

## 2019-12-18 DIAGNOSIS — L821 Other seborrheic keratosis: Secondary | ICD-10-CM | POA: Diagnosis not present

## 2019-12-18 DIAGNOSIS — Z85828 Personal history of other malignant neoplasm of skin: Secondary | ICD-10-CM | POA: Diagnosis not present

## 2019-12-18 DIAGNOSIS — L570 Actinic keratosis: Secondary | ICD-10-CM | POA: Diagnosis not present

## 2019-12-18 DIAGNOSIS — L578 Other skin changes due to chronic exposure to nonionizing radiation: Secondary | ICD-10-CM | POA: Diagnosis not present

## 2019-12-18 DIAGNOSIS — D225 Melanocytic nevi of trunk: Secondary | ICD-10-CM | POA: Diagnosis not present

## 2020-01-03 ENCOUNTER — Other Ambulatory Visit: Payer: Self-pay | Admitting: Family Medicine

## 2020-01-08 DIAGNOSIS — H16221 Keratoconjunctivitis sicca, not specified as Sjogren's, right eye: Secondary | ICD-10-CM | POA: Diagnosis not present

## 2020-01-08 LAB — HM DIABETES EYE EXAM

## 2020-01-22 DIAGNOSIS — H16221 Keratoconjunctivitis sicca, not specified as Sjogren's, right eye: Secondary | ICD-10-CM | POA: Diagnosis not present

## 2020-01-23 ENCOUNTER — Encounter: Payer: Self-pay | Admitting: Ophthalmology

## 2020-01-31 ENCOUNTER — Encounter: Payer: Self-pay | Admitting: Family Medicine

## 2020-01-31 ENCOUNTER — Ambulatory Visit (INDEPENDENT_AMBULATORY_CARE_PROVIDER_SITE_OTHER): Payer: Medicare Other | Admitting: Family Medicine

## 2020-01-31 ENCOUNTER — Other Ambulatory Visit: Payer: Self-pay

## 2020-01-31 VITALS — BP 142/60 | HR 61 | Temp 96.5°F | Ht 70.0 in | Wt 190.5 lb

## 2020-01-31 DIAGNOSIS — E119 Type 2 diabetes mellitus without complications: Secondary | ICD-10-CM | POA: Diagnosis not present

## 2020-01-31 DIAGNOSIS — E1149 Type 2 diabetes mellitus with other diabetic neurological complication: Secondary | ICD-10-CM

## 2020-01-31 LAB — POCT GLYCOSYLATED HEMOGLOBIN (HGB A1C): Hemoglobin A1C: 6 % — AB (ref 4.0–5.6)

## 2020-01-31 NOTE — Progress Notes (Signed)
This visit occurred during the SARS-CoV-2 public health emergency.  Safety protocols were in place, including screening questions prior to the visit, additional usage of staff PPE, and extensive cleaning of exam room while observing appropriate contact time as indicated for disinfecting solutions.  Diabetes:  Using medications without difficulties: yes Hypoglycemic episodes: rarely, mild, cautions d/w pt.  Down to 60s, if prolonged fasting.   Hyperglycemic episodes:no Feet problems: at baseline with neuropathy.  He is putting up with that at baseline.  Blood Sugars averaging: usually 120-135 eye exam within last year:  yes He is working on diet and exercise.   A1c 6, d/w pt at OV, improved.    He has some occ nausea that gets better with zofran. Unclear if related to metformin, d/w pt.  He may need to taper metformin in the future.  No blood in stool.  No vomiting.    He had 1st covid vaccine, pfizer, 01/11/20 and 2nd dose tomorrow.   Meds, vitals, and allergies reviewed.  ROS: Per HPI unless specifically indicated in ROS section   GEN: nad, alert and oriented HEENT: ncat NECK: supple w/o LA CV: rrr. PULM: ctab, no inc wob ABD: soft, +bs EXT: no edema SKIN: no acute rash  Diabetic foot exam: Normal inspection No skin breakdown No calluses  Normal DP pulses Dec but not absent sensation to light touch and monofilament Nails normal

## 2020-01-31 NOTE — Patient Instructions (Addendum)
If you have low sugars, then stop glimepiride.   Recheck in about 4 months with labs prior to a yearly visit.  Take care.  Glad to see you. Thanks for your effort.

## 2020-02-01 ENCOUNTER — Telehealth: Payer: Self-pay | Admitting: Family Medicine

## 2020-02-01 NOTE — Telephone Encounter (Signed)
Patient dropped off Handicapped Placard Form to be filled out.  Form is in rx tower.

## 2020-02-01 NOTE — Telephone Encounter (Signed)
Patient asked for form to be mailed to him when completed.  I verified patient's address.

## 2020-02-03 NOTE — Telephone Encounter (Signed)
Done. Thanks.

## 2020-02-03 NOTE — Assessment & Plan Note (Signed)
A1c 6, d/w pt at OV, improved.    He has some occ nausea that gets better with zofran. Unclear if related to metformin, d/w pt.  He may need to taper metformin in the future.  No blood in stool.  No vomiting.    If any low sugars, then stop glimepiride in the meantime. Recheck in about 4 months with labs prior to a yearly visit.  He agrees.

## 2020-02-04 ENCOUNTER — Encounter: Payer: Self-pay | Admitting: Family Medicine

## 2020-02-04 NOTE — Telephone Encounter (Signed)
Form mailed to patient per patient request

## 2020-04-23 DIAGNOSIS — M25562 Pain in left knee: Secondary | ICD-10-CM | POA: Diagnosis not present

## 2020-05-06 ENCOUNTER — Other Ambulatory Visit: Payer: Self-pay | Admitting: Family Medicine

## 2020-05-06 DIAGNOSIS — E1149 Type 2 diabetes mellitus with other diabetic neurological complication: Secondary | ICD-10-CM

## 2020-05-06 DIAGNOSIS — Z125 Encounter for screening for malignant neoplasm of prostate: Secondary | ICD-10-CM

## 2020-05-13 ENCOUNTER — Other Ambulatory Visit: Payer: Self-pay

## 2020-05-13 ENCOUNTER — Ambulatory Visit (INDEPENDENT_AMBULATORY_CARE_PROVIDER_SITE_OTHER): Payer: Medicare Other

## 2020-05-13 ENCOUNTER — Other Ambulatory Visit (INDEPENDENT_AMBULATORY_CARE_PROVIDER_SITE_OTHER): Payer: Medicare Other

## 2020-05-13 DIAGNOSIS — Z125 Encounter for screening for malignant neoplasm of prostate: Secondary | ICD-10-CM | POA: Diagnosis not present

## 2020-05-13 DIAGNOSIS — E1149 Type 2 diabetes mellitus with other diabetic neurological complication: Secondary | ICD-10-CM

## 2020-05-13 DIAGNOSIS — Z0000 Encounter for general adult medical examination without abnormal findings: Secondary | ICD-10-CM

## 2020-05-13 LAB — LIPID PANEL
Cholesterol: 166 mg/dL (ref 0–200)
HDL: 58.2 mg/dL (ref >39.00)
LDL Cholesterol: 100 mg/dL — ABNORMAL HIGH (ref 0–99)
NonHDL: 107.37
Total CHOL/HDL Ratio: 3
Triglycerides: 39 mg/dL (ref 0.0–149.0)
VLDL: 7.8 mg/dL (ref 0.0–40.0)

## 2020-05-13 LAB — COMPREHENSIVE METABOLIC PANEL
ALT: 26 U/L (ref 0–53)
AST: 26 U/L (ref 0–37)
Albumin: 4.7 g/dL (ref 3.5–5.2)
Alkaline Phosphatase: 44 U/L (ref 39–117)
BUN: 18 mg/dL (ref 6–23)
CO2: 31 mEq/L (ref 19–32)
Calcium: 9.6 mg/dL (ref 8.4–10.5)
Chloride: 102 mEq/L (ref 96–112)
Creatinine, Ser: 0.91 mg/dL (ref 0.40–1.50)
GFR: 83.42 mL/min (ref >60.00)
Glucose, Bld: 134 mg/dL — ABNORMAL HIGH (ref 70–99)
Potassium: 4.7 mEq/L (ref 3.5–5.1)
Sodium: 138 mEq/L (ref 135–145)
Total Bilirubin: 1.2 mg/dL (ref 0.2–1.2)
Total Protein: 7.3 g/dL (ref 6.0–8.3)

## 2020-05-13 LAB — HEMOGLOBIN A1C: Hgb A1c MFr Bld: 7.6 % — ABNORMAL HIGH (ref 4.6–6.5)

## 2020-05-13 LAB — PSA, MEDICARE: PSA: 0.34 ng/ml (ref 0.10–4.00)

## 2020-05-13 NOTE — Progress Notes (Signed)
PCP notes:  Health Maintenance:  pneumococcal 23- due Shingrix- due Colonoscopy- due on 05/15/2020   Abnormal Screenings: none   Patient concerns: Discuss treatment options for neuropathy   Nurse concerns: none   Next PCP appt: 05/15/2020 @ 9:45 am

## 2020-05-13 NOTE — Progress Notes (Signed)
Subjective:   Ian Johnson is a 64 y.o. male who presents for Medicare Annual/Subsequent preventive examination.  Review of Systems: N/A   I connected with the patient today by telephone and verified that I am speaking with the correct person using two identifiers. Location patient: home Location nurse: work Persons participating in the virtual visit: patient, Marine scientist.   I discussed the limitations, risks, security and privacy concerns of performing an evaluation and management service by telephone and the availability of in person appointments. I also discussed with the patient that there may be a patient responsible charge related to this service. The patient expressed understanding and verbally consented to this telephonic visit.    Interactive audio and video telecommunications were attempted between this nurse and patient, however failed, due to patient having technical difficulties OR patient did not have access to video capability.  We continued and completed visit with audio only.     Cardiac Risk Factors include: advanced age (>32men, >25 women);male gender;diabetes mellitus;hypertension;dyslipidemia     Objective:    Vitals: There were no vitals taken for this visit.  There is no height or weight on file to calculate BMI.  Advanced Directives 05/13/2020 05/04/2019 06/22/2016 06/22/2016 11/06/2013 01/15/2013  Does Patient Have a Medical Advance Directive? No No No No Patient does not have advance directive;Patient would not like information Patient does not have advance directive  Would patient like information on creating a medical advance directive? No - Patient declined No - Patient declined Yes - Scientist, clinical (histocompatibility and immunogenetics) given Yes - Scientist, clinical (histocompatibility and immunogenetics) given - -    Tobacco Social History   Tobacco Use  Smoking Status Never Smoker  Smokeless Tobacco Never Used     Counseling given: Not Answered   Clinical Intake:  Pre-visit preparation completed: Yes  Pain :  0-10 Pain Score: 5  Pain Type: Chronic pain Pain Location: Leg Pain Orientation: Left, Right Pain Descriptors / Indicators: Aching Pain Onset: More than a month ago Pain Frequency: Constant     Nutritional Risks: Nausea/ vomitting/ diarrhea(nausea from medications) Diabetes: Yes CBG done?: No Did pt. bring in CBG monitor from home?: No  How often do you need to have someone help you when you read instructions, pamphlets, or other written materials from your doctor or pharmacy?: 1 - Never What is the last grade level you completed in school?: 12th  Interpreter Needed?: No  Information entered by :: CJohnson, LPN  Past Medical History:  Diagnosis Date  . Adhesive capsulitis of right shoulder 11/09/2013  . Arthritis   . Backache, unspecified   . 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 Complex tear of medial meniscus of left knee as current injury 01/19/2013  . Diabetes mellitus without mention of complication   . Esophageal reflux   . Nonspecific elevation of levels of transaminase or lactic acid dehydrogenase (LDH)   . Other and unspecified hyperlipidemia   . Peripheral neuropathy   . Personal history of urinary calculi   . Unspecified essential hypertension    no meds now  . Wears glasses    Past Surgical History:  Procedure Laterality Date  . CHONDROPLASTY Left 01/19/2013   Procedure: CHONDROPLASTY;  Surgeon: Johnny Bridge, MD;  Location: [facility];  Service: Orthopedics;  Laterality: Left;  ARTHROSCOPY KNEE WITH DEBRIDEMENT/SHAVING (CHONDROPLASTY)  . COLONOSCOPY    . EYE SURGERY  [phone]   multiple lt eye reconstruction surg -glass eye  . KNEE ARTHROSCOPY W/ MENISCAL REPAIR Right  2016  . KNEE ARTHROSCOPY WITH MEDIAL MENISECTOMY Left 01/19/2013   Procedure: KNEE ARTHROSCOPY WITH MEDIAL MENISECTOMY;  Surgeon: Johnny Bridge, MD;  Location: [facility];  Service: Orthopedics;  Laterality: Left;  LEFT KNEE SCOPE WITH DEBRIDEMENT  AND partial MEDIAL MENISECTOMY  . LUMBAR DISC SURGERY  1990   l4 and l5  . SHOULDER ARTHROSCOPY WITH ROTATOR CUFF REPAIR AND SUBACROMIAL DECOMPRESSION Right 11/09/2013   Procedure: RIGHT SHOULDER ARTHROSCOPY WITH DEBRIDEMENT OF PARTIAL ROTATOR CUFF TEAR AND SUBACROMIAL DECOMPRESSION ;  Surgeon: Johnny Bridge, MD;  Location: [facility];  Service: Orthopedics;  Laterality: Right;  . UPPER GI ENDOSCOPY     Family History  Problem Relation Age of Onset  . Coronary artery disease Mother   . Coronary artery disease Father   . Peripheral vascular disease Father   . Prostate cancer Father   . Colon cancer Neg Hx    Social History   Socioeconomic History  . Marital status: Married    Spouse name: Not on file  . Number of children: 2  . Years of education: 63  . Highest education level: Not on file  Occupational History  . Occupation: Clinical research associate  Tobacco Use  . Smoking status: Never Smoker  . Smokeless tobacco: Never Used  Substance and Sexual Activity  . Alcohol use: No    Alcohol/week: 0.0 standard drinks  . Drug use: No  . Sexual activity: Yes    Partners: Female  Other Topics Concern  . Not on file  Social History Narrative   HSG. married 7 years - divorced; remarried 1993. 1 biological child and 2 step kids.    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    Social Determinants of Health   Financial Resource Strain: Low Risk   . Difficulty of Paying Living Expenses: Not hard at all  Food Insecurity: No Food Insecurity  . Worried About Charity fundraiser in the Last Year: Never true  . Ran Out of Food in the Last Year: Never true  Transportation Needs: No Transportation Needs  . Lack of Transportation (Medical): No  . Lack of Transportation (Non-Medical): No  Physical Activity: Insufficiently Active  . Days of Exercise per Week: 3 days  . Minutes of Exercise per Session: 40 min  Stress: No Stress Concern Present   . Feeling of Stress : Not at all  Social Connections:   . Frequency of Communication with Friends and Family:   . Frequency of Social Gatherings with Friends and Family:   . Attends Religious Services:   . Active Member of Clubs or Organizations:   . Attends Archivist Meetings:   Marland Kitchen Marital Status:     Outpatient Encounter Medications as of 05/13/2020  Medication Sig  . Alpha Lipoic Acid 200 MG CAPS Take 200 mg by mouth daily.  . Ascorbic Acid (VITAMIN C) 500 MG CAPS Take 1 each by mouth daily.  Marland Kitchen aspirin 81 MG EC tablet Take 81 mg by mouth daily.    . Coenzyme Q10 (CO Q-10) 100 MG CAPS Take 200 mg by mouth daily.    Marland Kitchen etodolac (LODINE) 500 MG tablet Take 1 tablet (500 mg total) by mouth 2 (two) times daily.  . fish oil-omega-3 fatty acids 1000 MG capsule Take 2 g by mouth daily. 1200 mg daily.  . Ginkgo Biloba 60 MG CAPS Take 1 each by mouth daily.  Marland Kitchen glimepiride (AMARYL) 2  MG tablet Take 0.5 tablets (1 mg total) by mouth daily.  Marland Kitchen losartan (COZAAR) 25 MG tablet Take 0.5 tablets (12.5 mg total) by mouth daily.  . metFORMIN (GLUCOPHAGE) 500 MG tablet Take 1 tablet (500 mg total) by mouth 2 (two) times daily with a meal.  . Multiple Vitamin (MULTIVITAMIN) tablet Take 1 tablet by mouth daily.    Marland Kitchen omeprazole (PRILOSEC) 20 MG capsule Take 1 capsule (20 mg total) by mouth 2 (two) times daily.  . ondansetron (ZOFRAN) 4 MG tablet Take 1 tablet (4 mg total) by mouth every 8 (eight) hours as needed for nausea or vomiting.  Glory Rosebush VERIO test strip USE 1 STRIP TO CHECK GLUCOSE TWICE DAILY AS NEEDED  . pioglitazone (ACTOS) 30 MG tablet Take 1 tablet (30 mg total) by mouth daily.  . pregabalin (LYRICA) 75 MG capsule Take 1 capsule (75 mg total) by mouth 2 (two) times daily.  . rosuvastatin (CRESTOR) 10 MG tablet Take 1 tab 5 times a week.  . vardenafil (LEVITRA) 10 MG tablet Take 1 tablet (10 mg total) by mouth daily as needed for erectile dysfunction.   No facility-administered  encounter medications on file as of 05/13/2020.    Activities of Daily Living In your present state of health, do you have any difficulty performing the following activities: 05/13/2020  Hearing? N  Vision? N  Difficulty concentrating or making decisions? N  Walking or climbing stairs? N  Dressing or bathing? N  Doing errands, shopping? N  Preparing Food and eating ? N  Using the Toilet? N  In the past six months, have you accidently leaked urine? N  Do you have problems with loss of bowel control? N  Managing your Medications? N  Managing your Finances? N  Housekeeping or managing your Housekeeping? N  Some recent data might be hidden    Patient Care Team: Tonia Ghent, MD as PCP - General (Family Medicine) Lyla Glassing, MD as Referring Physician (Ophthalmology) Marchia Bond, MD as Consulting Physician (Orthopedic Surgery) Jarome Matin, MD as Consulting Physician (Dermatology) Solon Augusta, MD as Attending Physician (Ophthalmology) Laurence Spates, MD (Inactive) as Consulting Physician (Gastroenterology)   Assessment:   This is a routine wellness examination for Ian Johnson.  Exercise Activities and Dietary recommendations Current Exercise Habits: Home exercise routine, Type of exercise: strength training/weights, Time (Minutes): 45, Frequency (Times/Week): 3, Weekly Exercise (Minutes/Week): 135, Intensity: Moderate, Exercise limited by: None identified  Goals    . Increase physical activity     Starting 05/04/19, I will continue to exercise for at least 60 min 3 days per week.     . Patient Stated     05/13/2020, I will continue to exercise 3 days a week for about 45 minutes.        Fall Risk Fall Risk  05/13/2020 05/04/2019 06/28/2017 06/22/2016 06/22/2016  Falls in the past year? 0 0 No No No  Number falls in past yr: 0 - - - -  Injury with Fall? 0 - - - -  Risk for fall due to : Medication side effect - - - -  Follow up Falls evaluation completed;Falls prevention  discussed - - - -   Is the patient's home free of loose throw rugs in walkways, pet beds, electrical cords, etc?   yes      Grab bars in the bathroom? no      Handrails on the stairs?   yes      Adequate lighting?   yes  Timed Get Up and Go Performed: N/A  Depression Screen PHQ 2/9 Scores 05/13/2020 05/04/2019 07/05/2018 06/28/2017  PHQ - 2 Score 0 0 0 0  PHQ- 9 Score 0 0 - -    Cognitive Function MMSE - Mini Mental State Exam 05/13/2020 05/04/2019 06/22/2016  Orientation to time 5 5 5   Orientation to Place 5 5 5   Registration 3 3 3   Attention/ Calculation 5 0 0  Recall 3 3 3   Language- name 2 objects - 0 0  Language- repeat 1 1 1   Language- follow 3 step command - 0 3  Language- read & follow direction - 0 0  Write a sentence - 0 0  Copy design - 0 0  Total score - 17 20  Mini Cog  Mini-Cog screen was completed. Maximum score is 22. A value of 0 denotes this part of the MMSE was not completed or the patient failed this part of the Mini-Cog screening.       Immunization History  Administered Date(s) Administered  . Fluad Quad(high Dose 65+) 09/25/2019  . Influenza Split 10/15/2011, 10/09/2012  . Influenza Whole 09/28/2002, 12/05/2007, 10/22/2008, 10/15/2010  . Influenza,inj,Quad PF,6+ Mos 10/23/2013, 10/21/2014, 10/20/2015, 10/26/2016, 10/20/2017, 10/10/2018  . PFIZER SARS-COV-2 Vaccination 01/11/2020  . Pneumococcal Conjugate-13 11/13/2013  . Pneumococcal Polysaccharide-23 01/08/2014  . Td 06/01/2006  . Tdap 11/24/2017  . Zoster 11/26/2014    Qualifies for Shingles Vaccine: Yes   Screening Tests Health Maintenance  Topic Date Due  . PNA vac Low Risk Adult (2 of 2 - PPSV23) 08/09/2019  . COVID-19 Vaccine (2 - Pfizer 2-dose series) 02/01/2020  . COLONOSCOPY  05/15/2020  . INFLUENZA VACCINE  07/06/2020  . HEMOGLOBIN A1C  07/30/2020  . OPHTHALMOLOGY EXAM  01/07/2021  . FOOT EXAM  01/30/2021  . TETANUS/TDAP  11/25/2027  . Hepatitis C Screening  Completed  . HIV  Screening  Completed   Cancer Screenings: Lung: Low Dose CT Chest recommended if Age 84-80 years, 30 pack-year currently smoking OR have quit w/in 15 years. Patient does not qualify. Colorectal: completed 05/15/2010  Additional Screenings:  Hepatitis C Screening: 06/17/2016      Plan:   Patient will continue to exercise 3 days a week for about 45 minutes.   I have personally reviewed and noted the following in the patient's chart:   . Medical and social history . Use of alcohol, tobacco or illicit drugs  . Current medications and supplements . Functional ability and status . Nutritional status . Physical activity . Advanced directives . List of other physicians . Hospitalizations, surgeries, and ER visits in previous 12 months . Vitals . Screenings to include cognitive, depression, and falls . Referrals and appointments  In addition, I have reviewed and discussed with patient certain preventive protocols, quality metrics, and best practice recommendations. A written personalized care plan for preventive services as well as general preventive health recommendations were provided to patient.     Andrez Grime, LPN  06/06/6202

## 2020-05-13 NOTE — Patient Instructions (Signed)
[name] , Thank you for taking time to come for your Medicare Wellness Visit. I appreciate your ongoing commitment to your health goals. Please review the following plan we discussed and let me know if I can assist you in the future.   Screening recommendations/referrals: Colonoscopy: Up to date, completed 05/15/10 due in 2 days Recommended yearly ophthalmology/optometry visit for glaucoma screening and checkup Recommended yearly dental visit for hygiene and checkup  Vaccinations: Influenza vaccine: Up to date, completed 09/25/2019 Pneumococcal vaccine: due Tdap vaccine: Up to date, completed 11/24/2017 Shingles vaccine: discussed    Advanced directives: Advance directive discussed with you today. Even though you declined this today please call our office should you change your mind and we can give you the proper paperwork for you to fill out.   Conditions/risks identified: diabetes, hypertension, hyperlipidemia  Next appointment: 05/15/2020 @ 9:45 am   Preventive Care 65 Years and Older, Male Preventive care refers to lifestyle choices and visits with your health care provider that can promote health and wellness. What does preventive care include?  A yearly physical exam. This is also called an annual well check.  Dental exams once or twice a year.  Routine eye exams. Ask your health care provider how often you should have your eyes checked.  Personal lifestyle choices, including:  Daily care of your teeth and gums.  Regular physical activity.  Eating a healthy diet.  Avoiding tobacco and drug use.  Limiting alcohol use.  Practicing safe sex.  Taking low doses of aspirin every day.  Taking vitamin and mineral supplements as recommended by your health care provider. What happens during an annual well check? The services and screenings done by your health care provider during your annual well check will depend on your age, overall health, lifestyle risk factors, and  family history of disease. Counseling  Your health care provider may ask you questions about your:  Alcohol use.  Tobacco use.  Drug use.  Emotional well-being.  Home and relationship well-being.  Sexual activity.  Eating habits.  History of falls.  Memory and ability to understand (cognition).  Work and work Statistician. Screening  You may have the following tests or measurements:  Height, weight, and BMI.  Blood pressure.  Lipid and cholesterol levels. These may be checked every 5 years, or more frequently if you are over 12 years old.  Skin check.  Lung cancer screening. You may have this screening every year starting at age 7 if you have a 30-pack-year history of smoking and currently smoke or have quit within the past 15 years.  Fecal occult blood test (FOBT) of the stool. You may have this test every year starting at age 67.  Flexible sigmoidoscopy or colonoscopy. You may have a sigmoidoscopy every 5 years or a colonoscopy every 10 years starting at age 61.  Prostate cancer screening. Recommendations will vary depending on your family history and other risks.  Hepatitis C blood test.  Hepatitis B blood test.  Sexually transmitted disease (STD) testing.  Diabetes screening. This is done by checking your blood sugar (glucose) after you have not eaten for a while (fasting). You may have this done every 1-3 years.  Abdominal aortic aneurysm (AAA) screening. You may need this if you are a current or former smoker.  Osteoporosis. You may be screened starting at age 45 if you are at high risk. Talk with your health care provider about your test results, treatment options, and if necessary, the need for more tests.  Vaccines  Your health care provider may recommend certain vaccines, such as:  Influenza vaccine. This is recommended every year.  Tetanus, diphtheria, and acellular pertussis (Tdap, Td) vaccine. You may need a Td booster every 10 years.  Zoster  vaccine. You may need this after age 24.  Pneumococcal 13-valent conjugate (PCV13) vaccine. One dose is recommended after age 47.  Pneumococcal polysaccharide (PPSV23) vaccine. One dose is recommended after age 37. Talk to your health care provider about which screenings and vaccines you need and how often you need them. This information is not intended to replace advice given to you by your health care provider. Make sure you discuss any questions you have with your health care provider. Document Released: 12/19/2015 Document Revised: 08/11/2016 Document Reviewed: 09/23/2015 Elsevier Interactive Patient Education  2017 [place] Prevention in the Home Falls can cause injuries. They can happen to people of all ages. There are many things you can do to make your home safe and to help prevent falls. What can I do on the outside of my home?  Regularly fix the edges of walkways and driveways and fix any cracks.  Remove anything that might make you trip as you walk through a door, such as a raised step or threshold.  Trim any bushes or trees on the path to your home.  Use bright outdoor lighting.  Clear any walking paths of anything that might make someone trip, such as rocks or tools.  Regularly check to see if handrails are loose or broken. Make sure that both sides of any steps have handrails.  Any raised decks and porches should have guardrails on the edges.  Have any leaves, snow, or ice cleared regularly.  Use sand or salt on walking paths during winter.  Clean up any spills in your garage right away. This includes oil or grease spills. What can I do in the bathroom?  Use night lights.  Install grab bars by the toilet and in the tub and shower. Do not use towel bars as grab bars.  Use non-skid mats or decals in the tub or shower.  If you need to sit down in the shower, use a plastic, non-slip stool.  Keep the floor dry. Clean up any water that spills on the  floor as soon as it happens.  Remove soap buildup in the tub or shower regularly.  Attach bath mats securely with double-sided non-slip rug tape.  Do not have throw rugs and other things on the floor that can make you trip. What can I do in the bedroom?  Use night lights.  Make sure that you have a light by your bed that is easy to reach.  Do not use any sheets or blankets that are too big for your bed. They should not hang down onto the floor.  Have a firm chair that has side arms. You can use this for support while you get dressed.  Do not have throw rugs and other things on the floor that can make you trip. What can I do in the kitchen?  Clean up any spills right away.  Avoid walking on wet floors.  Keep items that you use a lot in easy-to-reach places.  If you need to reach something above you, use a strong step stool that has a grab bar.  Keep electrical cords out of the way.  Do not use floor polish or wax that makes floors slippery. If you must use wax, use non-skid floor wax.  Do not have throw rugs and other things on the floor that can make you trip. What can I do with my stairs?  Do not leave any items on the stairs.  Make sure that there are handrails on both sides of the stairs and use them. Fix handrails that are broken or loose. Make sure that handrails are as long as the stairways.  Check any carpeting to make sure that it is firmly attached to the stairs. Fix any carpet that is loose or worn.  Avoid having throw rugs at the top or bottom of the stairs. If you do have throw rugs, attach them to the floor with carpet tape.  Make sure that you have a light switch at the top of the stairs and the bottom of the stairs. If you do not have them, ask someone to add them for you. What else can I do to help prevent falls?  Wear shoes that:  Do not have high heels.  Have rubber bottoms.  Are comfortable and fit you well.  Are closed at the toe. Do not wear  sandals.  If you use a stepladder:  Make sure that it is fully opened. Do not climb a closed stepladder.  Make sure that both sides of the stepladder are locked into place.  Ask someone to hold it for you, if possible.  Clearly mark and make sure that you can see:  Any grab bars or handrails.  First and last steps.  Where the edge of each step is.  Use tools that help you move around (mobility aids) if they are needed. These include:  Canes.  Walkers.  Scooters.  Crutches.  Turn on the lights when you go into a dark area. Replace any light bulbs as soon as they burn out.  Set up your furniture so you have a clear path. Avoid moving your furniture around.  If any of your floors are uneven, fix them.  If there are any pets around you, be aware of where they are.  Review your medicines with your doctor. Some medicines can make you feel dizzy. This can increase your chance of falling. Ask your doctor what other things that you can do to help prevent falls. This information is not intended to replace advice given to you by your health care provider. Make sure you discuss any questions you have with your health care provider. Document Released: 09/18/2009 Document Revised: 04/29/2016 Document Reviewed: 12/27/2014 Elsevier Interactive Patient Education  2017 [employer].

## 2020-05-15 ENCOUNTER — Encounter: Payer: Self-pay | Admitting: Family Medicine

## 2020-05-15 ENCOUNTER — Other Ambulatory Visit: Payer: Self-pay

## 2020-05-15 ENCOUNTER — Ambulatory Visit (INDEPENDENT_AMBULATORY_CARE_PROVIDER_SITE_OTHER): Payer: Medicare Other | Admitting: Family Medicine

## 2020-05-15 VITALS — BP 130/58 | HR 59 | Temp 97.7°F | Ht 70.0 in | Wt 182.4 lb

## 2020-05-15 DIAGNOSIS — Z23 Encounter for immunization: Secondary | ICD-10-CM | POA: Diagnosis not present

## 2020-05-15 DIAGNOSIS — Z1211 Encounter for screening for malignant neoplasm of colon: Secondary | ICD-10-CM

## 2020-05-15 DIAGNOSIS — E785 Hyperlipidemia, unspecified: Secondary | ICD-10-CM

## 2020-05-15 DIAGNOSIS — E1149 Type 2 diabetes mellitus with other diabetic neurological complication: Secondary | ICD-10-CM

## 2020-05-15 DIAGNOSIS — Z0000 Encounter for general adult medical examination without abnormal findings: Secondary | ICD-10-CM

## 2020-05-15 DIAGNOSIS — Z7189 Other specified counseling: Secondary | ICD-10-CM

## 2020-05-15 DIAGNOSIS — M25569 Pain in unspecified knee: Secondary | ICD-10-CM

## 2020-05-15 MED ORDER — ROSUVASTATIN CALCIUM 10 MG PO TABS: ORAL_TABLET | ORAL | Status: DC

## 2020-05-15 MED ORDER — PREGABALIN 75 MG PO CAPS: 75.0000 mg | ORAL_CAPSULE | Freq: Two times a day (BID) | ORAL | 1 refills | Status: DC

## 2020-05-15 NOTE — Progress Notes (Signed)
This visit occurred during the SARS-CoV-2 public health emergency.  Safety protocols were in place, including screening questions prior to the visit, additional usage of staff PPE, and extensive cleaning of exam room while observing appropriate contact time as indicated for disinfecting solutions.  Flu 2020 Shingrix d/w pt. PNA 2021. Tetanus 2018 covid vaccine 2021 Colonoscopy 2009- d/w pt.   Prostate cancer screening- PSA wnl Advance directive- wife designated if patient were incapacitated. He has AAA screening at external facility 2021.    GI referral placed.  He saw ortho about knee pain, with prev injection done with some improvement.  He is still careful not to "overdo it" with stairs or ladders.  He is going to go back to see ortho again since his knee doesn't feel good enough to return to his prev exercise.    Elevated Cholesterol: Using medications without problems: see below. Muscle aches:  Yes, likely/possibly from statin even with QOD dosing.   Diet compliance: encouraged.   Exercise:encouraged   Discussed treatment options for neuropathy.  Still on lyrica at baseline.  It helps some.  He is still putting up with neuropathy pain at baseline.  He didn't have continued/good relief with gabapentin.  Mult creams didn't help.   He is having more trouble with cramping in his legs.  Noted with sleeping.  Less sx during the day.  He is up frequently during the night due to cramps.  This may be related to statin use.  Pickle juice seems to help.    Diabetes:  Using medications without difficulties: yes except for some GI upset from metformin but this is tolerable.   Hypoglycemic episodes:no Hyperglycemic episodes:no Feet problems: still with neuropathy at baseline.   Blood Sugars averaging: ~120s usually eye exam within last year: yes Labs d/w pt. A1c 7.6.  Higher likely from exercise changes from knee pain and from steroid shot in knee Actos/metformin.    Meds, vitals, and  allergies reviewed.   ROS: Per HPI unless specifically indicated in ROS section   GEN: nad, alert and oriented HEENT: ncat NECK: supple w/o LA CV: rrr. PULM: ctab, no inc wob ABD: soft, +bs EXT: no edema SKIN: no acute rash  Diabetic foot exam: Normal inspection No skin breakdown No calluses  Normal DP pulses Dec sensation to light touch and monofilament Nails normal  At least 30 minutes were devoted to patient care in this encounter (this can potentially include time spent reviewing the patient's file/history, interviewing and examining the patient, counseling/reviewing plan with patient, ordering referrals, ordering tests, reviewing relevant laboratory or x-ray data, and documenting the encounter).

## 2020-05-15 NOTE — Patient Instructions (Addendum)
Stop the crestor totally for now.  When the cramps are better, try to take it once a week.  If tolerated, take it twice a week.  Update me as needed.   Let me know if that doesn't help.   Let me see about options for neuropathy in the meantime.    Check with your insurance to see if they will cover the shingles shot. Pneumonia shot today.   We'll call about seeing GI.   Take care.  Glad to see you.  Recheck in about 4 months.  A1c at the visit.

## 2020-05-18 DIAGNOSIS — M25569 Pain in unspecified knee: Secondary | ICD-10-CM | POA: Insufficient documentation

## 2020-05-18 NOTE — Assessment & Plan Note (Signed)
Flu 2020 Shingrix d/w pt. PNA 2021. Tetanus 2018 covid vaccine 2021 Colonoscopy 2009- d/w pt.   Prostate cancer screening- PSA wnl Advance directive- wife designated if patient were incapacitated. He has AAA screening at external facility 2021.

## 2020-05-18 NOTE — Assessment & Plan Note (Signed)
He is having more trouble with cramping in his legs.  Noted with sleeping.  Less sx during the day.  He is up frequently during the night due to cramps.  This may be related to statin use.  Pickle juice seems to help.  He is going to stop statin and see if the cramps get better.  If so he can try restarting statin 1 day a week and then may be increased to twice a week.  He will update me as needed.  See after visit summary.  He agrees with plan.

## 2020-05-18 NOTE — Assessment & Plan Note (Signed)
Advance directive- wife designated if patient were incapacitated.  

## 2020-05-18 NOTE — Assessment & Plan Note (Signed)
He saw ortho about knee pain, with prev injection done with some improvement.  He is still careful not to "overdo it" with stairs or ladders.  He is going to go back to see ortho again since his knee doesn't feel good enough to return to his prev exercise.

## 2020-05-18 NOTE — Assessment & Plan Note (Signed)
Discussed treatment options for neuropathy.  Still on lyrica at baseline.  It helps some.  He is still putting up with neuropathy pain at baseline.  He didn't have continued/good relief with gabapentin.  Mult creams didn't help.   I want to consider options and will be in touch with the patient.  No change in meds otherwise at this point.  Labs discussed with patient.  He agrees.  Continue work on diet and exercise as best he can.

## 2020-05-21 DIAGNOSIS — M25562 Pain in left knee: Secondary | ICD-10-CM | POA: Diagnosis not present

## 2020-06-16 ENCOUNTER — Telehealth: Payer: Self-pay | Admitting: Family Medicine

## 2020-06-16 DIAGNOSIS — G629 Polyneuropathy, unspecified: Secondary | ICD-10-CM

## 2020-06-16 MED ORDER — VENLAFAXINE HCL ER 37.5 MG PO CP24
37.5000 mg | ORAL_CAPSULE | Freq: Every day | ORAL | 1 refills | Status: DC
Start: 2020-06-16 — End: 2020-09-25

## 2020-06-16 NOTE — Telephone Encounter (Signed)
Please check with patient.  I have been considering options in the meantime about neuropathy.  It is reasonable to try taking Effexor 37.5 mg once a day.  This should not interact with his other medications.  It is typically used as an antidepressant but in some patients it does have an effect on pain, especially with neuropathy.  In these cases, it can get used even if the patient does not have depression.  It is the safest option I can think of at this point that might do some good.  Also want to recheck a B12 level the next time he has labs drawn.  I put in the order.    If he does not tolerate venlafaxine or if he does not see any effect from it then let me know.  I sent the lowest dose possible so it may be that he tolerates it but does not have an effect until he increase his dose later on.  Thanks.

## 2020-06-17 NOTE — Telephone Encounter (Signed)
Pt left v/m that received notification from [address] that pt had new med to pick up. Pt called [facility] to see what the med was for and [facility] advised pt med was for depression; pt said he does not take nor need med for depression and request cb.  Noted in 05/15/20 visit that Dr Damita Dunnings was considering options for neuropathy. Unable to reach pt by phone and sending note to [facility] in basket for pt to get another cb later.

## 2020-06-17 NOTE — Telephone Encounter (Signed)
Patient returned phone call. I advised him of Dr. Josefine Class comments and recommendations and he verbalized understanding. Patient states he will keep [place] posted on how he does with the Venlafaxine.  Thanks!

## 2020-06-17 NOTE — Telephone Encounter (Signed)
Still unable to reach pt by phone and requesting pt to cb via v/m.

## 2020-06-18 DIAGNOSIS — M25562 Pain in left knee: Secondary | ICD-10-CM | POA: Diagnosis not present

## 2020-06-21 DIAGNOSIS — M25562 Pain in left knee: Secondary | ICD-10-CM | POA: Diagnosis not present

## 2020-06-27 DIAGNOSIS — M25562 Pain in left knee: Secondary | ICD-10-CM | POA: Diagnosis not present

## 2020-06-27 DIAGNOSIS — S83211D Bucket-handle tear of medial meniscus, current injury, right knee, subsequent encounter: Secondary | ICD-10-CM | POA: Diagnosis not present

## 2020-07-30 DIAGNOSIS — M1712 Unilateral primary osteoarthritis, left knee: Secondary | ICD-10-CM | POA: Diagnosis not present

## 2020-08-04 ENCOUNTER — Other Ambulatory Visit: Payer: Self-pay | Admitting: Family Medicine

## 2020-08-04 DIAGNOSIS — K219 Gastro-esophageal reflux disease without esophagitis: Secondary | ICD-10-CM | POA: Diagnosis not present

## 2020-08-04 DIAGNOSIS — Z79899 Other long term (current) drug therapy: Secondary | ICD-10-CM | POA: Diagnosis not present

## 2020-08-06 DIAGNOSIS — M1712 Unilateral primary osteoarthritis, left knee: Secondary | ICD-10-CM | POA: Diagnosis not present

## 2020-08-13 DIAGNOSIS — M1712 Unilateral primary osteoarthritis, left knee: Secondary | ICD-10-CM | POA: Diagnosis not present

## 2020-09-02 DIAGNOSIS — Z1159 Encounter for screening for other viral diseases: Secondary | ICD-10-CM | POA: Diagnosis not present

## 2020-09-05 DIAGNOSIS — K219 Gastro-esophageal reflux disease without esophagitis: Secondary | ICD-10-CM | POA: Diagnosis not present

## 2020-09-05 DIAGNOSIS — Z1211 Encounter for screening for malignant neoplasm of colon: Secondary | ICD-10-CM | POA: Diagnosis not present

## 2020-09-05 LAB — HM COLONOSCOPY

## 2020-09-16 ENCOUNTER — Ambulatory Visit: Payer: Medicare Other | Admitting: Family Medicine

## 2020-09-25 ENCOUNTER — Encounter: Payer: Self-pay | Admitting: Family Medicine

## 2020-09-25 ENCOUNTER — Ambulatory Visit (INDEPENDENT_AMBULATORY_CARE_PROVIDER_SITE_OTHER): Payer: Medicare Other | Admitting: Family Medicine

## 2020-09-25 ENCOUNTER — Other Ambulatory Visit: Payer: Self-pay

## 2020-09-25 VITALS — BP 134/58 | HR 51 | Temp 96.8°F | Ht 70.0 in | Wt 179.2 lb

## 2020-09-25 DIAGNOSIS — E1149 Type 2 diabetes mellitus with other diabetic neurological complication: Secondary | ICD-10-CM | POA: Diagnosis not present

## 2020-09-25 DIAGNOSIS — Z23 Encounter for immunization: Secondary | ICD-10-CM

## 2020-09-25 DIAGNOSIS — R634 Abnormal weight loss: Secondary | ICD-10-CM | POA: Diagnosis not present

## 2020-09-25 DIAGNOSIS — E119 Type 2 diabetes mellitus without complications: Secondary | ICD-10-CM | POA: Diagnosis not present

## 2020-09-25 DIAGNOSIS — I10 Essential (primary) hypertension: Secondary | ICD-10-CM

## 2020-09-25 DIAGNOSIS — M25569 Pain in unspecified knee: Secondary | ICD-10-CM

## 2020-09-25 LAB — POCT GLYCOSYLATED HEMOGLOBIN (HGB A1C): Hemoglobin A1C: 6 % — AB (ref 4.0–5.6)

## 2020-09-25 LAB — CBC WITH DIFFERENTIAL/PLATELET
Basophils Absolute: 0 10*3/uL (ref 0.0–0.1)
Basophils Relative: 0.3 % (ref 0.0–3.0)
Eosinophils Absolute: 0.1 10*3/uL (ref 0.0–0.7)
Eosinophils Relative: 1.8 % (ref 0.0–5.0)
HCT: 40.5 % (ref 39.0–52.0)
Hemoglobin: 13.6 g/dL (ref 13.0–17.0)
Lymphocytes Relative: 38.6 % (ref 12.0–46.0)
Lymphs Abs: 1.4 10*3/uL (ref 0.7–4.0)
MCHC: 33.5 g/dL (ref 30.0–36.0)
MCV: 93.9 fl (ref 78.0–100.0)
Monocytes Absolute: 0.5 10*3/uL (ref 0.1–1.0)
Monocytes Relative: 13.7 % — ABNORMAL HIGH (ref 3.0–12.0)
Neutro Abs: 1.7 10*3/uL (ref 1.4–7.7)
Neutrophils Relative %: 45.6 % (ref 43.0–77.0)
Platelets: 193 10*3/uL (ref 150.0–400.0)
RBC: 4.32 Mil/uL (ref 4.22–5.81)
RDW: 13.2 % (ref 11.5–15.5)
WBC: 3.6 10*3/uL — ABNORMAL LOW (ref 4.0–10.5)

## 2020-09-25 LAB — TSH: TSH: 1.74 u[IU]/mL (ref 0.35–4.50)

## 2020-09-25 MED ORDER — PREGABALIN 75 MG PO CAPS
75.0000 mg | ORAL_CAPSULE | Freq: Two times a day (BID) | ORAL | 1 refills | Status: DC
Start: 2020-09-25 — End: 2021-05-26

## 2020-09-25 MED ORDER — OMEPRAZOLE 40 MG PO CPDR: 40.0000 mg | DELAYED_RELEASE_CAPSULE | Freq: Two times a day (BID) | ORAL | Status: DC

## 2020-09-25 MED ORDER — PIOGLITAZONE HCL 30 MG PO TABS
30.0000 mg | ORAL_TABLET | Freq: Every day | ORAL | 3 refills | Status: DC
Start: 2020-09-25 — End: 2021-10-08

## 2020-09-25 MED ORDER — ROSUVASTATIN CALCIUM 10 MG PO TABS: ORAL_TABLET | ORAL | Status: DC

## 2020-09-25 MED ORDER — METFORMIN HCL 500 MG PO TABS: 500.0000 mg | ORAL_TABLET | Freq: Two times a day (BID) | ORAL | 3 refills | Status: DC

## 2020-09-25 MED ORDER — LOSARTAN POTASSIUM 25 MG PO TABS: 12.5000 mg | ORAL_TABLET | Freq: Every day | ORAL | 3 refills | Status: DC

## 2020-09-25 NOTE — Progress Notes (Signed)
This visit occurred during the SARS-CoV-2 public health emergency.  Safety protocols were in place, including screening questions prior to the visit, additional usage of staff PPE, and extensive cleaning of exam room while observing appropriate contact time as indicated for disinfecting solutions.  Diabetes:  Using medications without difficulties:yes Hypoglycemic episodes: no Hyperglycemic episodes: no Feet problems: still on lyrica at baseline.   Blood Sugars averaging: 150s in the AMs eye exam within last year: yes A1c 6.  D/w pt at OV. He had intolerance to crestor 3x/week.  Cramping was worse on med.  He cut back to twice a week.  We talked about once a week dosing.    Weight is lower.  He has cut back some on his diet.  No FCNVAD.  No night sweats.  He doesn't feel sick or unwell.  He had GI f/u re: colonoscopy (with Dr. Cristina Gong with [facility])  He had knee injection per ortho.  Using a knee brace and icing as needed.  He is putting up with his situation until he can finish renovating his house.    He had sig itching on venlafaxine; it got better with cessation.  Med/allergy list updated.    Meds, vitals, and allergies reviewed.  ROS: Per HPI unless specifically indicated in ROS section   GEN: nad, alert and oriented HEENT: ncat NECK: supple w/o LA CV: rrr. PULM: ctab, no inc wob ABD: soft, +bs EXT: no edema SKIN: no acute rash

## 2020-09-25 NOTE — Patient Instructions (Addendum)
Try crestor once a week or 1/2 tab twice a week.  Thanks for your effort.   Go to the lab on the way out.   If you have mychart we'll likely use that to update you.    Flu shot today.  Take care.  Glad to see you.  Plan on recheck in about 3-4 months.  We can do labs at the visit.

## 2020-09-26 LAB — COMPREHENSIVE METABOLIC PANEL
ALT: 24 U/L (ref 0–53)
AST: 24 U/L (ref 0–37)
Albumin: 4.3 g/dL (ref 3.5–5.2)
Alkaline Phosphatase: 39 U/L (ref 39–117)
BUN: 14 mg/dL (ref 6–23)
CO2: 32 mEq/L (ref 19–32)
Calcium: 9.5 mg/dL (ref 8.4–10.5)
Chloride: 102 mEq/L (ref 96–112)
Creatinine, Ser: 0.89 mg/dL (ref 0.40–1.50)
GFR: 95 mL/min (ref >60.00)
Glucose, Bld: 118 mg/dL — ABNORMAL HIGH (ref 70–99)
Potassium: 4.8 mEq/L (ref 3.5–5.1)
Sodium: 140 mEq/L (ref 135–145)
Total Bilirubin: 0.9 mg/dL (ref 0.2–1.2)
Total Protein: 6.8 g/dL (ref 6.0–8.3)

## 2020-09-28 DIAGNOSIS — R634 Abnormal weight loss: Secondary | ICD-10-CM | POA: Insufficient documentation

## 2020-09-28 NOTE — Assessment & Plan Note (Signed)
A1c 6.  D/w pt at OV. He had intolerance to crestor 3x/week.  Cramping was worse on med.  He cut back to twice a week.  We talked about once a week dosing.  See after visit summary.  Recheck periodically.  He will update me as needed.

## 2020-09-28 NOTE — Assessment & Plan Note (Signed)
  Weight is lower.  He has cut back some on his diet.  No FCNVAD.  No night sweats.  He doesn't feel sick or unwell.  He had GI f/u re: colonoscopy (with Dr. Cristina Gong with [facility])  His weight loss could be due to dietary changes but reasonable to recheck labs in the meantime.  See notes on labs.

## 2020-09-28 NOTE — Assessment & Plan Note (Signed)
He had knee injection per ortho.  Using a knee brace and icing as needed.  He is putting up with his situation until he can finish renovating his house.    Continue as is for now with knee brace and icing.

## 2020-09-29 ENCOUNTER — Other Ambulatory Visit: Payer: Self-pay | Admitting: Family Medicine

## 2020-09-30 NOTE — Telephone Encounter (Signed)
Electronic refill request. Etodolac Last office visit:   09/25/2020 Last Filled:    180 tablet 3 09/27/2019

## 2020-10-01 NOTE — Telephone Encounter (Signed)
Sent. Thanks.   

## 2020-10-02 ENCOUNTER — Encounter: Payer: Self-pay | Admitting: Family Medicine

## 2020-12-23 DIAGNOSIS — L812 Freckles: Secondary | ICD-10-CM | POA: Diagnosis not present

## 2020-12-23 DIAGNOSIS — C44212 Basal cell carcinoma of skin of right ear and external auricular canal: Secondary | ICD-10-CM | POA: Diagnosis not present

## 2020-12-23 DIAGNOSIS — D2272 Melanocytic nevi of left lower limb, including hip: Secondary | ICD-10-CM | POA: Diagnosis not present

## 2020-12-23 DIAGNOSIS — D2271 Melanocytic nevi of right lower limb, including hip: Secondary | ICD-10-CM | POA: Diagnosis not present

## 2020-12-23 DIAGNOSIS — C4441 Basal cell carcinoma of skin of scalp and neck: Secondary | ICD-10-CM | POA: Diagnosis not present

## 2020-12-23 DIAGNOSIS — Z85828 Personal history of other malignant neoplasm of skin: Secondary | ICD-10-CM | POA: Diagnosis not present

## 2020-12-23 DIAGNOSIS — D225 Melanocytic nevi of trunk: Secondary | ICD-10-CM | POA: Diagnosis not present

## 2020-12-23 DIAGNOSIS — D485 Neoplasm of uncertain behavior of skin: Secondary | ICD-10-CM | POA: Diagnosis not present

## 2020-12-23 DIAGNOSIS — L570 Actinic keratosis: Secondary | ICD-10-CM | POA: Diagnosis not present

## 2020-12-23 DIAGNOSIS — L821 Other seborrheic keratosis: Secondary | ICD-10-CM | POA: Diagnosis not present

## 2021-01-08 DIAGNOSIS — S83242A Other tear of medial meniscus, current injury, left knee, initial encounter: Secondary | ICD-10-CM | POA: Diagnosis not present

## 2021-01-08 DIAGNOSIS — M948X6 Other specified disorders of cartilage, lower leg: Secondary | ICD-10-CM | POA: Diagnosis not present

## 2021-01-08 DIAGNOSIS — M7502 Adhesive capsulitis of left shoulder: Secondary | ICD-10-CM | POA: Diagnosis not present

## 2021-01-08 DIAGNOSIS — M19012 Primary osteoarthritis, left shoulder: Secondary | ICD-10-CM | POA: Diagnosis not present

## 2021-01-08 DIAGNOSIS — S83232A Complex tear of medial meniscus, current injury, left knee, initial encounter: Secondary | ICD-10-CM | POA: Diagnosis not present

## 2021-01-21 DIAGNOSIS — M7502 Adhesive capsulitis of left shoulder: Secondary | ICD-10-CM | POA: Diagnosis not present

## 2021-01-21 DIAGNOSIS — M19012 Primary osteoarthritis, left shoulder: Secondary | ICD-10-CM | POA: Diagnosis not present

## 2021-01-22 ENCOUNTER — Ambulatory Visit (INDEPENDENT_AMBULATORY_CARE_PROVIDER_SITE_OTHER): Payer: Medicare Other | Admitting: Family Medicine

## 2021-01-22 ENCOUNTER — Other Ambulatory Visit: Payer: Self-pay

## 2021-01-22 ENCOUNTER — Encounter: Payer: Self-pay | Admitting: Family Medicine

## 2021-01-22 VITALS — BP 132/60 | HR 69 | Temp 97.3°F | Ht 70.0 in | Wt 179.0 lb

## 2021-01-22 DIAGNOSIS — E1149 Type 2 diabetes mellitus with other diabetic neurological complication: Secondary | ICD-10-CM

## 2021-01-22 DIAGNOSIS — E119 Type 2 diabetes mellitus without complications: Secondary | ICD-10-CM | POA: Diagnosis not present

## 2021-01-22 LAB — POCT GLYCOSYLATED HEMOGLOBIN (HGB A1C): Hemoglobin A1C: 6.2 % — AB (ref 4.0–5.6)

## 2021-01-22 MED ORDER — ROSUVASTATIN CALCIUM 10 MG PO TABS
ORAL_TABLET | ORAL | Status: DC
Start: 2021-01-22 — End: 2021-10-08

## 2021-01-22 NOTE — Progress Notes (Signed)
This visit occurred during the SARS-CoV-2 public health emergency.  Safety protocols were in place, including screening questions prior to the visit, additional usage of staff PPE, and extensive cleaning of exam room while observing appropriate contact time as indicated for disinfecting solutions.  Diabetes:  Using medications without difficulties: glimepiride, actos and metformin.   Hypoglycemic episodes: no Hyperglycemic episodes: no, cautions d/w pt.  He is careful at baseline.   Feet problems: neuropathy at baseline "hanging in there" Blood Sugars averaging: usually ~120-130s eye exam within last year: due, d/w pt.   A1c 6.2, d/w pt.   We talked about statin use.  He couldn't tolerate twice weekly dosing.  He is going to take break from medication and then try 1/2 tab weekly.    He had L knee scope 2 weeks ago.  That helped with knee pain.    Meds, vitals, and allergies reviewed.   ROS: Per HPI unless specifically indicated in ROS section   GEN: nad, alert and oriented HEENT: ncat NECK: supple w/o LA CV: rrr. PULM: ctab, no inc wob ABD: soft, +bs EXT: no edema SKIN: well perfused.

## 2021-01-22 NOTE — Patient Instructions (Addendum)
Plan on recheck in about 4 months with labs ahead of a yearly visit.  Take care.  Glad to see you. Update me as needed.

## 2021-01-25 NOTE — Assessment & Plan Note (Signed)
Continue glimepiride, actos and metformin.   A1c 6.2, d/w pt.   We talked about statin use.  He couldn't tolerate twice weekly dosing.  He is going to take break from medication and then try 1/2 tab weekly.   Recheck periodically. Continue work on diet and exercise. He agrees.

## 2021-01-26 ENCOUNTER — Ambulatory Visit: Payer: Medicare Other | Admitting: Family Medicine

## 2021-01-27 ENCOUNTER — Other Ambulatory Visit: Payer: Self-pay | Admitting: Family Medicine

## 2021-01-27 ENCOUNTER — Telehealth: Payer: Self-pay

## 2021-01-27 NOTE — Telephone Encounter (Signed)
Pharmacy requests refill on: Etodolac 500 mg   LAST REFILL: 10/01/2020 (Q-180, R-1) LAST OV: 01/22/2021 NEXT OV: 05/26/2021 PHARMACY: [place], [place]   Earliest Fill Date: 04/01/2021

## 2021-01-27 NOTE — Telephone Encounter (Signed)
Can you check on this please?

## 2021-01-27 NOTE — Telephone Encounter (Signed)
-----  Message from Tonia Ghent, MD sent at 01/25/2021  9:35 AM EST ----- Please check in the record. He is a reliable patient and said he was sent a urine test kit. He thought it came from [place] here at the clinic. I do not see any order for this and I do not recall doing anything like this. I do not know if it came from an insurance company or if it came from some other third-party. If you find anything about this, please let me know. I told him I did not think it came from [place]. Thanks.  Brigitte Pulse

## 2021-01-28 NOTE — Telephone Encounter (Signed)
Pt returned call. Advised test is probably from insurance. Pt said he would rather just have test done when PCP thinks it should be done. He can't even figure out what test it is or how to do it. He is sure PCP will order what will need to be done. Advised if he would like to bring test in this nurse could look at it. Pt said he is not concerned about it. Advised if he changed his mind to contact office and if he received any further testing kits to contact office. Pt appreciative and verbalized understanding.

## 2021-01-28 NOTE — Telephone Encounter (Signed)
Discussed with contact at [facility] who reports they used to send out urine tests but are not currently but are looking to start again in the future. She believes it is probably the [employer] company that has sent this test.   LVM

## 2021-01-28 NOTE — Telephone Encounter (Signed)
Noted.  Thanks.  I appreciate the help of all involved.   

## 2021-02-18 DIAGNOSIS — S83242D Other tear of medial meniscus, current injury, left knee, subsequent encounter: Secondary | ICD-10-CM | POA: Diagnosis not present

## 2021-04-14 ENCOUNTER — Telehealth: Payer: Self-pay | Admitting: Family Medicine

## 2021-04-14 NOTE — Telephone Encounter (Signed)
LVM for pt to rtn my call to r/s appt with nha. 

## 2021-04-15 ENCOUNTER — Telehealth: Payer: Self-pay | Admitting: Family Medicine

## 2021-04-15 NOTE — Telephone Encounter (Signed)
Made in error

## 2021-04-21 ENCOUNTER — Other Ambulatory Visit: Payer: Self-pay | Admitting: Family Medicine

## 2021-05-14 ENCOUNTER — Other Ambulatory Visit: Payer: Self-pay | Admitting: Family Medicine

## 2021-05-14 ENCOUNTER — Ambulatory Visit: Payer: Medicare Other

## 2021-05-14 ENCOUNTER — Telehealth: Payer: Self-pay | Admitting: Family Medicine

## 2021-05-14 DIAGNOSIS — G629 Polyneuropathy, unspecified: Secondary | ICD-10-CM

## 2021-05-14 DIAGNOSIS — E1149 Type 2 diabetes mellitus with other diabetic neurological complication: Secondary | ICD-10-CM

## 2021-05-14 DIAGNOSIS — Z125 Encounter for screening for malignant neoplasm of prostate: Secondary | ICD-10-CM

## 2021-05-14 NOTE — Progress Notes (Signed)
  Chronic Care Management   Outreach Note  05/14/2021 Name: Ian Johnson MRN: [id number] DOB: 1956/07/06  Referred by: Tonia Ghent, MD Reason for referral : No chief complaint on file.   An unsuccessful telephone outreach was attempted today. The patient was referred to the pharmacist for assistance with care management and care coordination.   Follow Up Plan:   Tatjana Dellinger Upstream Scheduler

## 2021-05-15 ENCOUNTER — Other Ambulatory Visit: Payer: Self-pay

## 2021-05-15 ENCOUNTER — Other Ambulatory Visit (INDEPENDENT_AMBULATORY_CARE_PROVIDER_SITE_OTHER): Payer: Medicare Other

## 2021-05-15 DIAGNOSIS — Z125 Encounter for screening for malignant neoplasm of prostate: Secondary | ICD-10-CM

## 2021-05-15 DIAGNOSIS — E1149 Type 2 diabetes mellitus with other diabetic neurological complication: Secondary | ICD-10-CM

## 2021-05-15 DIAGNOSIS — G629 Polyneuropathy, unspecified: Secondary | ICD-10-CM | POA: Diagnosis not present

## 2021-05-15 LAB — COMPREHENSIVE METABOLIC PANEL
ALT: 27 U/L (ref 0–53)
AST: 24 U/L (ref 0–37)
Albumin: 4.6 g/dL (ref 3.5–5.2)
Alkaline Phosphatase: 43 U/L (ref 39–117)
BUN: 22 mg/dL (ref 6–23)
CO2: 29 mEq/L (ref 19–32)
Calcium: 9.3 mg/dL (ref 8.4–10.5)
Chloride: 102 mEq/L (ref 96–112)
Creatinine, Ser: 0.93 mg/dL (ref 0.40–1.50)
GFR: 85.41 mL/min (ref >60.00)
Glucose, Bld: 133 mg/dL — ABNORMAL HIGH (ref 70–99)
Potassium: 4.6 mEq/L (ref 3.5–5.1)
Sodium: 139 mEq/L (ref 135–145)
Total Bilirubin: 0.9 mg/dL (ref 0.2–1.2)
Total Protein: 7.4 g/dL (ref 6.0–8.3)

## 2021-05-15 LAB — LIPID PANEL
Cholesterol: 259 mg/dL — ABNORMAL HIGH (ref 0–200)
HDL: 54.7 mg/dL (ref >39.00)
LDL Cholesterol: 185 mg/dL — ABNORMAL HIGH (ref 0–99)
NonHDL: 204.72
Total CHOL/HDL Ratio: 5
Triglycerides: 100 mg/dL (ref 0.0–149.0)
VLDL: 20 mg/dL (ref 0.0–40.0)

## 2021-05-15 LAB — CBC WITH DIFFERENTIAL/PLATELET
Basophils Absolute: 0 10*3/uL (ref 0.0–0.1)
Basophils Relative: 0.6 % (ref 0.0–3.0)
Eosinophils Absolute: 0.1 10*3/uL (ref 0.0–0.7)
Eosinophils Relative: 2.5 % (ref 0.0–5.0)
HCT: 40.2 % (ref 39.0–52.0)
Hemoglobin: 13.7 g/dL (ref 13.0–17.0)
Lymphocytes Relative: 47 % — ABNORMAL HIGH (ref 12.0–46.0)
Lymphs Abs: 2 10*3/uL (ref 0.7–4.0)
MCHC: 34.1 g/dL (ref 30.0–36.0)
MCV: 92.7 fl (ref 78.0–100.0)
Monocytes Absolute: 0.5 10*3/uL (ref 0.1–1.0)
Monocytes Relative: 12.1 % — ABNORMAL HIGH (ref 3.0–12.0)
Neutro Abs: 1.6 10*3/uL (ref 1.4–7.7)
Neutrophils Relative %: 37.8 % — ABNORMAL LOW (ref 43.0–77.0)
Platelets: 188 10*3/uL (ref 150.0–400.0)
RBC: 4.34 Mil/uL (ref 4.22–5.81)
RDW: 13 % (ref 11.5–15.5)
WBC: 4.3 10*3/uL (ref 4.0–10.5)

## 2021-05-15 LAB — HEMOGLOBIN A1C: Hgb A1c MFr Bld: 6.3 % (ref 4.6–6.5)

## 2021-05-15 LAB — PSA, MEDICARE: PSA: 0.28 ng/ml (ref 0.10–4.00)

## 2021-05-15 LAB — VITAMIN B12: Vitamin B-12: 535 pg/mL (ref 211–911)

## 2021-05-15 LAB — TSH: TSH: 3.09 u[IU]/mL (ref 0.35–4.50)

## 2021-05-21 ENCOUNTER — Telehealth: Payer: Self-pay | Admitting: Family Medicine

## 2021-05-21 NOTE — Chronic Care Management (AMB) (Signed)
  Chronic Care Management   Note  05/21/2021 Name: WILBURN KEIR MRN: [id number] DOB: [birth year]  Rashee THORVALD ORSINO is a 65 y.o. year old male who is a primary care patient of Tonia Ghent, MD. I reached out to [name] by phone today in response to a referral sent by Mr. Sam Vear Clock PCP, Tonia Ghent, MD.   Mr. Sunde was given information about Chronic Care Management services today including:  CCM service includes personalized support from designated clinical staff supervised by his physician, including individualized plan of care and coordination with other care providers 24/7 contact phone numbers for assistance for urgent and routine care needs. Service will only be billed when office clinical staff spend 20 minutes or more in a month to coordinate care. Only one practitioner may furnish and bill the service in a calendar month. The patient may stop CCM services at any time (effective at the end of the month) by phone call to the office staff.   Patient agreed to services and verbal consent obtained.   Follow up plan:   Tatjana Secretary/administrator

## 2021-05-26 ENCOUNTER — Ambulatory Visit: Payer: Medicare Other | Admitting: Family Medicine

## 2021-05-26 ENCOUNTER — Other Ambulatory Visit: Payer: Self-pay | Admitting: Family Medicine

## 2021-05-26 NOTE — Telephone Encounter (Signed)
Refill request for Lyrica 75 mg caps; printed rx by mistake; please send thru EMR if approved.  LOV - 01/22/21 Next OV - 05/29/21 Last refill - 09/25/20 #180/1

## 2021-05-27 MED ORDER — PREGABALIN 75 MG PO CAPS: 75.0000 mg | ORAL_CAPSULE | Freq: Two times a day (BID) | ORAL | 1 refills | Status: DC

## 2021-05-27 NOTE — Telephone Encounter (Signed)
Sent. Thanks.   

## 2021-05-27 NOTE — Addendum Note (Signed)
Addended by: Tonia Ghent on: 05/27/2021 02:03 PM   Modules accepted: Orders

## 2021-05-28 ENCOUNTER — Telehealth: Payer: Self-pay | Admitting: Pharmacist

## 2021-05-28 DIAGNOSIS — Z9229 Personal history of other drug therapy: Secondary | ICD-10-CM

## 2021-05-28 NOTE — Progress Notes (Signed)
[facility])                                            [place] Team                                        Statin Quality Measure Assessment    05/28/2021  ARAF CLUGSTON 1956/10/31 [id number]   Per review of chart and payor information, patient has a diagnosis of diabetes but is not currently filling a statin prescription.  This places patient into the SUPD (Statin Use In Patients with Diabetes) measure for CMS.   Spoke with patient over the phone. HIPAA identifiers were obtained.  He said he was still taking Rosuvastatin 10 mg 1/2 tablet weekly.  Rosuvastatin has not been filled this year. Patient may have a stock of medication from his dose being decreased.  One 90 day fill of a stain during this calendar year will close the measure.   PCP appointment--05/29/21  The 10-year ASCVD risk score Mikey Bussing DC Brooke Bonito., et al., 2013) is: 33%   Values used to calculate the score:     Age: 65 years     Sex: Male     Is Non-Hispanic African American: No     Diabetic: Yes     Tobacco smoker: No     Systolic Blood Pressure: 001 mmHg     Is BP treated: Yes     HDL Cholesterol: 54.7 mg/dL     Total Cholesterol: 259 mg/dL 05/15/2021     Component Value Date/Time   CHOL 259 (H) 05/15/2021 0846   TRIG 100.0 05/15/2021 0846   HDL 54.70 05/15/2021 0846   CHOLHDL 5 05/15/2021 0846   VLDL 20.0 05/15/2021 0846   LDLCALC 185 (H) 05/15/2021 0846   LDLDIRECT 142.9 06/21/2011 0908    Please consider ONE of the following recommendations:  Initiate high intensity statin Atorvastatin 40mg  once daily, #90, 3 refills   Rosuvastatin 20mg  once daily, #90, 3 refills    Initiate moderate intensity          statin with reduced frequency if prior          statin intolerance 1x weekly, #13, 3 refills   2x weekly, #26, 3 refills   3x weekly, #39, 3 refills    Code for past statin intolerance or  other exclusions (required annually)  Provider  Requirements: Associate code during an office visit or telehealth encounter  Drug Induced Myopathy G72.0   Myopathy, unspecified G72.9   Myositis, unspecified M60.9   Rhabdomyolysis V49.44   Alcoholic fatty liver H67.5   Cirrhosis of liver K74.69   Prediabetes R73.03   PCOS E28.2   Toxic liver disease, unspecified K71.9   Adverse effect of antihyperlipidemic and antiarteriosclerotic drugs, initial encounter [place], PharmD, [place] Pharmacist [phone]

## 2021-05-29 ENCOUNTER — Ambulatory Visit: Payer: Medicare Other | Admitting: Family Medicine

## 2021-06-05 ENCOUNTER — Encounter: Payer: Self-pay | Admitting: Family Medicine

## 2021-06-05 ENCOUNTER — Ambulatory Visit: Payer: Medicare Other

## 2021-06-05 ENCOUNTER — Ambulatory Visit (INDEPENDENT_AMBULATORY_CARE_PROVIDER_SITE_OTHER): Payer: Medicare Other | Admitting: Family Medicine

## 2021-06-05 ENCOUNTER — Other Ambulatory Visit: Payer: Self-pay

## 2021-06-05 DIAGNOSIS — E785 Hyperlipidemia, unspecified: Secondary | ICD-10-CM | POA: Diagnosis not present

## 2021-06-05 DIAGNOSIS — E1149 Type 2 diabetes mellitus with other diabetic neurological complication: Secondary | ICD-10-CM

## 2021-06-05 MED ORDER — CYCLOSPORINE 0.05 % OP EMUL: 1.0000 [drp] | Freq: Every day | OPHTHALMIC | 0 refills | Status: AC

## 2021-06-05 MED ORDER — ONDANSETRON HCL 4 MG PO TABS: 4.0000 mg | ORAL_TABLET | Freq: Three times a day (TID) | ORAL | 5 refills | Status: DC | PRN

## 2021-06-05 NOTE — Patient Instructions (Addendum)
Crestor- try 1/2 tab weekly as a trial.  Let me know how that goes.   Recheck in about 4 months with A1c at the visit.  Take care.  Glad to see you.

## 2021-06-05 NOTE — Progress Notes (Signed)
This visit occurred during the SARS-CoV-2 public health emergency.  Safety protocols were in place, including screening questions prior to the visit, additional usage of staff PPE, and extensive cleaning of exam room while observing appropriate contact time as indicated for disinfecting solutions.  Flu encouraged Shingrix d/w pt.   PNA up to date Tetanus 2018 Covid vaccine d/w pt.   Colonoscopy 2021 Prostate cancer screening- PSA wnl Advance directive- wife designated if patient were incapacitated.  Diabetes:  Using medications without difficulties: yes Hypoglycemic episodes:no Hyperglycemic episodes: no Feet problems:neuropathy at baseline.  Stinging pain, still on lyrica.  More pain at night.  He can tolerate as is.  Blood Sugars averaging: usually ~120 eye exam within last year: d/w pt.   A1c 6.3.   Labs d/w pt.    D/w pt about aspirin.  Would continue for now.  No bleeding.    He needed refill on restasis- he had trouble getting refill per eye clinic- short term rx sent.  I asked him to follow-up with eye clinic.  Elevated Cholesterol: He was having knee cramps after knee surgery.  He stopped statin and cramps got better.   Lipids elevated d/w pt.   Had been off statin in the meantime.   D/w pt about trying 1/2 tab weekly as a trial.  He can update me as needed.  D/w pt about lipid clinic referral if he can't tolerate intermittent statin.    Meds, vitals, and allergies reviewed.   ROS: Per HPI unless specifically indicated in ROS section   GEN: nad, alert and oriented HEENT: ncat NECK: supple w/o LA CV: rrr. PULM: ctab, no inc wob ABD: soft, +bs EXT: no edema SKIN: no acute rash  Diabetic foot exam: Normal inspection No skin breakdown No calluses  Normal DP pulses Dec sensation to light touch and monofilament Nails normal

## 2021-06-08 NOTE — Assessment & Plan Note (Signed)
Lipids elevated d/w pt.   Had been off statin in the meantime.   D/w pt about trying 1/2 tab weekly as a trial.  He can update me as needed.  D/w pt about lipid clinic referral if he can't tolerate intermittent statin.

## 2021-06-08 NOTE — Assessment & Plan Note (Signed)
A1c controlled at 6.3.  Continue Lyrica as is for neuropathy.  He can put up with his level of pain as is.  Continue Actos glimepiride and recheck periodically.  He agrees with plan.

## 2021-06-16 ENCOUNTER — Telehealth: Payer: Self-pay

## 2021-06-16 NOTE — Progress Notes (Addendum)
Chronic Care Management Pharmacy Assistant   Name: Ian Johnson  MRN: [id number] DOB: 02-14-1956  Reason for Encounter: Initial Questions and Chart Review for 06/23/21 CCM Appointment   Recent office visits:  06/05/21- PCP - Refilled restasis short term as patient unable to get from eye clinic. Advised patient to start back on crestor 1/2 tablet weekly as a trial. Follow up in 4 months. 01/22/21- PCP- Ordered A1C. Enouraged patient to start back on crestor 0.5 tablet once a week. Follow up in 4 months  Recent consult visits:  02/18/21- Orthopedics- No information available. 01/22/21- Orthopedics- No information available. 01/08/21- Orthopedics-  Procedure. No other information available.  12/23/20- Dermatology- No information available.  Medications: Outpatient Encounter Medications as of 06/16/2021  Medication Sig   Alpha Lipoic Acid 200 MG CAPS Take 200 mg by mouth daily.   Ascorbic Acid (VITAMIN C) 500 MG CAPS Take 1 each by mouth daily.   aspirin 81 MG EC tablet Take 81 mg by mouth daily.   Coenzyme Q10 (CO Q-10) 100 MG CAPS Take 200 mg by mouth daily.   cycloSPORINE (RESTASIS) 0.05 % ophthalmic emulsion Place 1 drop into the right eye daily.   etodolac (LODINE) 500 MG tablet Take 1 tablet by mouth twice daily with food   fish oil-omega-3 fatty acids 1000 MG capsule Take 2 g by mouth daily. 1200 mg daily.   Ginkgo Biloba 60 MG CAPS Take 1 each by mouth daily.   glimepiride (AMARYL) 2 MG tablet Take 1 tablet by mouth once daily   losartan (COZAAR) 25 MG tablet Take 0.5 tablets (12.5 mg total) by mouth daily.   metFORMIN (GLUCOPHAGE) 500 MG tablet Take 1 tablet (500 mg total) by mouth 2 (two) times daily with a meal.   Multiple Vitamin (MULTIVITAMIN) tablet Take 1 tablet by mouth daily.   omeprazole (PRILOSEC) 40 MG capsule Take 1 capsule (40 mg total) by mouth 2 (two) times daily.   ondansetron (ZOFRAN) 4 MG tablet Take 1 tablet (4 mg total) by mouth every 8 (eight) hours as needed  for nausea or vomiting.   ONETOUCH VERIO test strip USE 1 STRIP TO CHECK GLUCOSE TWICE DAILY AS NEEDED   pioglitazone (ACTOS) 30 MG tablet Take 1 tablet (30 mg total) by mouth daily.   pregabalin (LYRICA) 75 MG capsule Take 1 capsule (75 mg total) by mouth 2 (two) times daily.   rosuvastatin (CRESTOR) 10 MG tablet Take 0.5 tab once a week if tolerated.   vardenafil (LEVITRA) 10 MG tablet Take 1 tablet (10 mg total) by mouth daily as needed for erectile dysfunction.   No facility-administered encounter medications on file as of 06/16/2021.    Lab Results  Component Value Date/Time   HGBA1C 6.3 05/15/2021 08:46 AM   HGBA1C 6.2 (A) 01/22/2021 10:40 AM   HGBA1C 6.0 (A) 09/25/2020 09:24 AM   HGBA1C 7.6 (H) 05/13/2020 08:59 AM   MICROALBUR 0.2 06/15/2012 04:45 PM     BP Readings from Last 3 Encounters:  06/05/21 120/62  01/22/21 132/60  09/25/20 (!) 134/58   Attempted contact with Ian Johnson 3 times on 06/16/21, 06/17/21, 06/18/21. Unsuccessful outreach.   Ian Johnson was contacted to request he have all medications, supplements and any blood glucose and blood pressure readings available for review with Debbora Dus, Pharm. D, at his telephone visit on 06/23/21 at 3:30. Unable to reach patient after 3 attempts.    Star Rating Drugs:  Medication:  Last Fill: Day Supply Rosuvastatin  10 mg 04/23/20 - Patient not tolerating. Starting back on 0.5 tablet weekly. Losartan 25 mg 06/03/21 90 Metformin 500 mg 05/02/21 90 Actos 30 mg  05/26/21 90 Glimepiride 2 mg 05/26/21 90   Care Gaps: Next annual wellness visit: 06/26/21 If Diabetic: Last eye exam / retinopathy screening: 01/08/20 Last diabetic foot exam: 06/05/21    Debbora Dus, CPP notified  Margaretmary Dys, [place] [phone]   I have reviewed the care management and care coordination activities outlined in this encounter and I am certifying that I agree with the content of this note. No further  action required.  Debbora Dus, PharmD Clinical Pharmacist [facility] at [facility] [phone]

## 2021-06-23 ENCOUNTER — Telehealth: Payer: Medicare Other

## 2021-06-23 ENCOUNTER — Telehealth: Payer: Self-pay

## 2021-06-23 NOTE — Telephone Encounter (Signed)
Patient requested to cancel CCM appointment today at 3:30 PM due to his wife's surgery. He will contact me to reschedule at later date.  Debbora Dus, PharmD Clinical Pharmacist [facility] at [facility] [phone]

## 2021-06-23 NOTE — Progress Notes (Deleted)
Chronic Care Management Pharmacy Note  06/23/2021 Name:  Ian Johnson MRN:  [id number] DOB:  September 26, 1956  Summary: ***  Recommendations/Changes made from today's visit: ***  Plan: ***   Subjective: Ian Johnson is an 65 y.o. year old male who is a primary patient of Damita Dunnings, Elveria Rising, MD.  The CCM team was consulted for assistance with disease management and care coordination needs.    {CCMTELEPHONEFACETOFACE:21091510} for {CCMINITIALFOLLOWUPCHOICE:21091511} in response to provider referral for pharmacy case management and/or care coordination services.   Consent to Services:  {CCMCONSENTOPTIONS:25074}  Patient Care Team: Tonia Ghent, MD as PCP - General (Family Medicine) Lyla Glassing, MD as Referring Physician (Ophthalmology) Marchia Bond, MD as Consulting Physician (Orthopedic Surgery) Jarome Matin, MD as Consulting Physician (Dermatology) Solon Augusta, MD as Attending Physician (Ophthalmology) Laurence Spates, MD (Inactive) as Consulting Physician (Gastroenterology) Debbora Dus, [facility] as Pharmacist (Pharmacist)  Recent office visits: ***  Recent consult visits: [facility] visits: {Hospital DC Yes/No:25215}   Objective:  Lab Results  Component Value Date   CREATININE 0.93 05/15/2021   BUN 22 05/15/2021   GFR 85.41 05/15/2021   GFRNONAA >90 01/17/2013   GFRAA >90 01/17/2013   NA 139 05/15/2021   K 4.6 05/15/2021   CALCIUM 9.3 05/15/2021   CO2 29 05/15/2021   GLUCOSE 133 (H) 05/15/2021    Lab Results  Component Value Date/Time   HGBA1C 6.3 05/15/2021 08:46 AM   HGBA1C 6.2 (A) 01/22/2021 10:40 AM   HGBA1C 6.0 (A) 09/25/2020 09:24 AM   HGBA1C 7.6 (H) 05/13/2020 08:59 AM   GFR 85.41 05/15/2021 08:46 AM   GFR 95.00 09/25/2020 10:12 AM   MICROALBUR 0.2 06/15/2012 04:45 PM    Last diabetic Eye exam:  Lab Results  Component Value Date/Time   HMDIABEYEEXA No Retinopathy 01/08/2020 12:00 AM    Last diabetic Foot exam: No results  found for: HMDIABFOOTEX   Lab Results  Component Value Date   CHOL 259 (H) 05/15/2021   HDL 54.70 05/15/2021   LDLCALC 185 (H) 05/15/2021   LDLDIRECT 142.9 06/21/2011   TRIG 100.0 05/15/2021   CHOLHDL 5 05/15/2021    Hepatic Function Latest Ref Rng & Units 05/15/2021 09/25/2020 05/13/2020  Total Protein 6.0 - 8.3 g/dL 7.4 6.8 7.3  Albumin 3.5 - 5.2 g/dL 4.6 4.3 4.7  AST 0 - 37 U/L 24 24 26   ALT 0 - 53 U/L 27 24 26   Alk Phosphatase 39 - 117 U/L 43 39 44  Total Bilirubin 0.2 - 1.2 mg/dL 0.9 0.9 1.2  Bilirubin, Direct 0.0 - 0.3 mg/dL - - -    Lab Results  Component Value Date/Time   TSH 3.09 05/15/2021 08:46 AM   TSH 1.74 09/25/2020 10:12 AM    CBC Latest Ref Rng & Units 05/15/2021 09/25/2020 11/09/2013  WBC 4.0 - 10.5 K/uL 4.3 3.6(L) -  Hemoglobin 13.0 - 17.0 g/dL 13.7 13.6 14.6  Hematocrit 39.0 - 52.0 % 40.2 40.5 -  Platelets 150.0 - 400.0 K/uL 188.0 193.0 -    No results found for: VD25OH  Clinical ASCVD: {YES/NO:21197} The 10-year ASCVD risk score Mikey Bussing DC Jr., et al., 2013) is: 28.6%   Values used to calculate the score:     Age: 39 years     Sex: Male     Is Non-Hispanic African American: No     Diabetic: Yes     Tobacco smoker: No     Systolic Blood Pressure: 622 mmHg     Is  BP treated: Yes     HDL Cholesterol: 54.7 mg/dL     Total Cholesterol: 259 mg/dL    Depression screen [facility] 2/9 05/13/2020 05/04/2019 07/05/2018  Decreased Interest 0 0 0  Down, Depressed, Hopeless 0 0 0  PHQ - 2 Score 0 0 0  Altered sleeping 0 0 -  Tired, decreased energy 0 0 -  Change in appetite 0 0 -  Feeling bad or failure about yourself  0 0 -  Trouble concentrating 0 0 -  Moving slowly or fidgety/restless 0 0 -  Suicidal thoughts 0 0 -  PHQ-9 Score 0 0 -  Difficult doing work/chores Not difficult at all Not difficult at all -     ***Other: (CHADS2VASc if Afib, MMRC or CAT for COPD, ACT, DEXA)  Social History   Tobacco Use  Smoking Status Never  Smokeless Tobacco Never   BP  Readings from Last 3 Encounters:  06/05/21 120/62  01/22/21 132/60  09/25/20 (!) 134/58   Pulse Readings from Last 3 Encounters:  06/05/21 (!) 59  01/22/21 69  09/25/20 (!) 51   Wt Readings from Last 3 Encounters:  06/05/21 187 lb (84.8 kg)  01/22/21 179 lb (81.2 kg)  09/25/20 179 lb 4 oz (81.3 kg)   BMI Readings from Last 3 Encounters:  06/05/21 26.83 kg/m  01/22/21 25.68 kg/m  09/25/20 25.72 kg/m    Assessment/Interventions: Review of patient past medical history, allergies, medications, health status, including review of consultants reports, laboratory and other test data, was performed as part of comprehensive evaluation and provision of chronic care management services.   SDOH:  (Social Determinants of Health) assessments and interventions performed: {yes/no:20286}  SDOH Screenings   Alcohol Screen: Not on file  Depression (PHQ2-9): Not on file  Financial Resource Strain: Not on file  Food Insecurity: Not on file  Housing: Not on file  Physical Activity: Not on file  Social Connections: Not on file  Stress: Not on file  Tobacco Use: Low Risk    Smoking Tobacco Use: Never   Smokeless Tobacco Use: Never  Transportation Needs: Not on file    CCM Care Plan  Allergies  Allergen Reactions   Codeine     REACTION: Nausea   Crestor [Rosuvastatin]     myalgia   Duloxetine Other (See Comments)    Intolerant per record review   Gabapentin Other (See Comments)    Effect for pain control wore off, became ineffective.    Phenergan [Promethazine Hcl] Other (See Comments)    Sedation caution at 49m.  Was able to tolerate 12.543m     Venlafaxine     itching    Medications Reviewed Today     Reviewed by IsFilbert BertholdCMA (Certified Medical Assistant) on 06/05/21 at 1539  Med List Status: <None>   Medication Order Taking? Sig Documenting Provider Last Dose Status Informant  Alpha Lipoic Acid 200 MG CAPS [id number] Take 200 mg by mouth daily. [provider] Taking Active   Ascorbic Acid (VITAMIN C) 500 MG CAPS [id number] Take 1 each by mouth daily. [provider] Taking Active   aspirin 81 MG EC tablet [id number] Take 81 mg by mouth daily. [provider] Taking Active   Coenzyme Q10 (CO Q-10) 100 MG CAPS [id number] Take 200 mg by mouth daily. [provider] Taking Active   etodolac (LODINE) 500 MG tablet [id number] Take 1 tablet by mouth twice daily with food DuTonia GhentMD  Taking Active   fish oil-omega-3 fatty acids 1000 MG capsule [id number] Yes Take 2 g by mouth daily. 1200 mg daily. [provider] Taking Active   Ginkgo Biloba 60 MG CAPS [id number] Yes Take 1 each by mouth daily. [provider] Taking Active   glimepiride (AMARYL) 2 MG tablet [id number] Yes Take 1 tablet by mouth once daily Tonia Ghent, MD Taking Active   losartan (COZAAR) 25 MG tablet [id number] Yes Take 0.5 tablets (12.5 mg total) by mouth daily. Tonia Ghent, MD Taking Active   metFORMIN (GLUCOPHAGE) 500 MG tablet [id number] Yes Take 1 tablet (500 mg total) by mouth 2 (two) times daily with a meal. Tonia Ghent, MD Taking Active   Multiple Vitamin (MULTIVITAMIN) tablet [id number] Yes Take 1 tablet by mouth daily. [provider] Taking Active   omeprazole (PRILOSEC) 40 MG capsule [id number] Yes Take 1 capsule (40 mg total) by mouth 2 (two) times daily. Tonia Ghent, MD Taking Active   ondansetron [facility]) 4 MG tablet [id number] Yes Take 1 tablet (4 mg total) by mouth every 8 (eight) hours as needed for nausea or vomiting. Tonia Ghent, MD Taking Active   [facility] VERIO test strip [id number] Yes USE 1 STRIP TO CHECK GLUCOSE TWICE DAILY AS NEEDED Tonia Ghent, MD Taking Active   pioglitazone (ACTOS) 30 MG tablet [id number] Yes Take 1 tablet (30 mg total) by mouth daily. Tonia Ghent, MD Taking Active   pregabalin (LYRICA) 75 MG capsule [id number] Yes Take 1 capsule (75 mg total)  by mouth 2 (two) times daily. Tonia Ghent, MD Taking Active   rosuvastatin (CRESTOR) 10 MG tablet [id number] Yes Take 0.5 tab once a week if tolerated. Tonia Ghent, MD Taking Active   vardenafil (LEVITRA) 10 MG tablet [id number] Yes Take 1 tablet (10 mg total) by mouth daily as needed for erectile dysfunction. Tonia Ghent, MD Taking Active             Patient Active Problem List   Diagnosis Date Noted   Weight loss 09/28/2020   Knee pain 05/18/2020   Healthcare maintenance 05/09/2019   FH: prostate cancer 07/05/2018   Nasal congestion 10/27/2016   Advance care planning 06/23/2016   Medicare annual wellness visit, subsequent 06/19/2015   ED (erectile dysfunction) 06/19/2014   Basal cell carcinoma of skin 04/08/2014   Adhesive capsulitis of right shoulder 11/09/2013   Leg cramps 06/18/2012   FOOT PAIN, BILATERAL 02/12/2011   Diabetes mellitus type 2 with neurological manifestations ([place]) 06/20/2009   Essential hypertension 11/18/2008   HLD (hyperlipidemia) 09/22/2007   GERD 09/22/2007   BACK PAIN 09/22/2007   NEPHROLITHIASIS, HX OF 09/22/2007    Immunization History  Administered Date(s) Administered   Fluad Quad(high Dose 65+) 09/25/2019, 09/25/2020   Influenza Split 10/15/2011, 10/09/2012   Influenza Whole 09/28/2002, 12/05/2007, 10/22/2008, 10/15/2010   Influenza,inj,Quad PF,6+ Mos 10/23/2013, 10/21/2014, 10/20/2015, 10/26/2016, 10/20/2017, 10/10/2018   PFIZER(Purple Top)SARS-COV-2 Vaccination 01/11/2020, 02/01/2020, 10/16/2020   Pneumococcal Conjugate-13 11/13/2013   Pneumococcal Polysaccharide-23 01/08/2014, 05/15/2020   Td 06/01/2006   Tdap 11/24/2017   Zoster Recombinat (Shingrix) 04/24/2021   Zoster, Live 11/26/2014    Conditions to be addressed/monitored:  {USCCMDZASSESSMENTOPTIONS:23563}  There are no care plans that you recently modified to display for this patient.    Medication Assistance:  {MEDASSISTANCEINFO:25044}  Compliance/Adherence/Medication fill history: Care Gaps: ***  Star-Rating Drugs: ***  Patient's preferred pharmacy is:  [facility] [address] [place]  [place] [address] Phone: [phone] Fax: [phone]  [place], [facility] [place] [address] Phone: [phone] Fax: [phone]  [place], [place] [address] [place] [address] Phone: [phone] Fax: [phone]  Uses pill box? {Yes or If no, why not?:20788} Pt endorses ***% compliance  We discussed: {Pharmacy options:24294} Patient decided to: {US Pharmacy Plan:23885}  Care Plan and Follow Up Patient Decision:  {FOLLOWUP:24991}  Plan: {CM FOLLOW UP PLAN:25073}  ***

## 2021-06-26 ENCOUNTER — Telehealth: Payer: Self-pay

## 2021-06-26 ENCOUNTER — Ambulatory Visit: Payer: Medicare Other

## 2021-06-26 NOTE — Telephone Encounter (Signed)
Called patient 4 times trying to complete AWV. Called [phone], number attached to appointment and I believe this is a fax machine. Called number listed in chart, [phone] and went straight to voicemail. Left message notifying patient I called and to reschedule at his convenience. Appointment cancelled.

## 2021-08-17 ENCOUNTER — Other Ambulatory Visit: Payer: Self-pay | Admitting: Family Medicine

## 2021-08-27 ENCOUNTER — Ambulatory Visit: Payer: Medicare Other

## 2021-09-14 ENCOUNTER — Telehealth: Payer: Self-pay | Admitting: Family Medicine

## 2021-09-14 NOTE — Telephone Encounter (Signed)
LVM for pt to r/s appt with [facility] on 09/23/21.

## 2021-09-22 ENCOUNTER — Telehealth: Payer: Self-pay | Admitting: Family Medicine

## 2021-09-22 DIAGNOSIS — I10 Essential (primary) hypertension: Secondary | ICD-10-CM

## 2021-09-23 ENCOUNTER — Ambulatory Visit: Payer: Medicare Other

## 2021-09-23 MED ORDER — OMEPRAZOLE 40 MG PO CPDR: 40.0000 mg | DELAYED_RELEASE_CAPSULE | Freq: Two times a day (BID) | ORAL | 1 refills | Status: DC

## 2021-09-23 NOTE — Addendum Note (Signed)
Addended by: Sherrilee Gilles B on: 09/23/2021 12:45 PM   Modules accepted: Orders

## 2021-09-23 NOTE — Telephone Encounter (Signed)
  Encourage patient to contact the pharmacy for refills or they can request refills through [place]:  Please schedule appointment if longer than 1 year  NEXT APPOINTMENT DATE:  MEDICATION:omeprazole (PRILOSEC) 40 MG capsule  Is the patient out of medication?   [place], [place] - [address]   Let patient know to contact pharmacy at the end of the day to make sure medication is ready.  Please notify patient to allow 48-72 hours to process  CLINICAL FILLS OUT ALL BELOW:   LAST REFILL:  QTY:  REFILL DATE:    OTHER COMMENTS:    Okay for refill?  Please advise

## 2021-09-23 NOTE — Telephone Encounter (Signed)
RX sent

## 2021-09-30 DIAGNOSIS — M25512 Pain in left shoulder: Secondary | ICD-10-CM | POA: Diagnosis not present

## 2021-10-07 ENCOUNTER — Encounter: Payer: Self-pay | Admitting: Pharmacist

## 2021-10-07 DIAGNOSIS — Z79899 Other long term (current) drug therapy: Secondary | ICD-10-CM

## 2021-10-07 NOTE — Progress Notes (Signed)
[facility])                                            [place] Team    10/07/2021  VINCENT STREATER [birth year] [id number]  Per review of chart and payor information, patient has a diagnosis of diabetes but is not currently filling a statin prescription.  This places patient into the SUPD (Statin Use In Patients with Diabetes) measure for CMS.    Spoke with patient over the phone. HIPAA identifiers were obtained.  He said he was still taking Rosuvastatin 10 mg 1/2 tablet weekly.  Rosuvastatin has not been filled this year. Patient may have a stock of medication from his dose being decreased.  One 90 day fill of a stain during this calendar year will close the measure.    PCP appointment--05/29/21   The 10-year ASCVD risk score Mikey Bussing DC Brooke Bonito., et al., 2013) is: 33%   Values used to calculate the score:     Age: 65 years     Sex: Male     Is Non-Hispanic African American: No     Diabetic: Yes     Tobacco smoker: No     Systolic Blood Pressure: 421 mmHg     Is BP treated: Yes     HDL Cholesterol: 54.7 mg/dL     Total Cholesterol: 259 mg/dL 05/15/2021   Labs (Brief)          Component Value Date/Time    CHOL 259 (H) 05/15/2021 0846    TRIG 100.0 05/15/2021 0846    HDL 54.70 05/15/2021 0846    CHOLHDL 5 05/15/2021 0846    VLDL 20.0 05/15/2021 0846    LDLCALC 185 (H) 05/15/2021 0846    LDLDIRECT 142.9 06/21/2011 0908        Please consider ONE of the following recommendations:      Initiate high intensity statin Atorvastatin 40mg  once daily, #90, 3 refills   Rosuvastatin 20mg  once daily, #90, 3 refills     Initiate moderate intensity          statin with reduced frequency if prior          statin intolerance 1x weekly, #13, 3 refills   2x weekly, #26, 3 refills   3x weekly, #39, 3 refills     Code for past statin intolerance or  other exclusions (required annually)   Provider Requirements: Associate code  during an office visit or telehealth encounter  Drug Induced Myopathy G72.0   Myopathy, unspecified G72.9   Myositis, unspecified M60.9   Rhabdomyolysis I31.28   Alcoholic fatty liver F18.8   Cirrhosis of liver K74.69   Prediabetes R73.03   PCOS E28.2   Toxic liver disease, unspecified K71.9   Adverse effect of antihyperlipidemic and antiarteriosclerotic drugs, initial encounter [facility], PharmD, [place] Pharmacist [phone]

## 2021-10-08 ENCOUNTER — Other Ambulatory Visit: Payer: Self-pay

## 2021-10-08 ENCOUNTER — Encounter: Payer: Self-pay | Admitting: Family Medicine

## 2021-10-08 ENCOUNTER — Ambulatory Visit (INDEPENDENT_AMBULATORY_CARE_PROVIDER_SITE_OTHER): Payer: Medicare Other | Admitting: Family Medicine

## 2021-10-08 VITALS — BP 122/62 | HR 59 | Temp 97.7°F | Ht 70.0 in | Wt 186.0 lb

## 2021-10-08 DIAGNOSIS — E785 Hyperlipidemia, unspecified: Secondary | ICD-10-CM

## 2021-10-08 DIAGNOSIS — E1149 Type 2 diabetes mellitus with other diabetic neurological complication: Secondary | ICD-10-CM

## 2021-10-08 DIAGNOSIS — I10 Essential (primary) hypertension: Secondary | ICD-10-CM

## 2021-10-08 DIAGNOSIS — K219 Gastro-esophageal reflux disease without esophagitis: Secondary | ICD-10-CM

## 2021-10-08 LAB — LIPID PANEL
Cholesterol: 204 mg/dL — ABNORMAL HIGH (ref 0–200)
HDL: 58.1 mg/dL (ref >39.00)
LDL Cholesterol: 129 mg/dL — ABNORMAL HIGH (ref 0–99)
NonHDL: 145.41
Total CHOL/HDL Ratio: 4
Triglycerides: 80 mg/dL (ref 0.0–149.0)
VLDL: 16 mg/dL (ref 0.0–40.0)

## 2021-10-08 LAB — POCT GLYCOSYLATED HEMOGLOBIN (HGB A1C): Hemoglobin A1C: 6.1 % — AB (ref 4.0–5.6)

## 2021-10-08 MED ORDER — ROSUVASTATIN CALCIUM 10 MG PO TABS: ORAL_TABLET | ORAL | Status: DC

## 2021-10-08 MED ORDER — ETODOLAC 500 MG PO TABS: 500.0000 mg | ORAL_TABLET | Freq: Two times a day (BID) | ORAL | 3 refills | Status: DC

## 2021-10-08 MED ORDER — GLIMEPIRIDE 2 MG PO TABS: 2.0000 mg | ORAL_TABLET | Freq: Every day | ORAL | 3 refills | Status: DC

## 2021-10-08 MED ORDER — PREGABALIN 75 MG PO CAPS: 75.0000 mg | ORAL_CAPSULE | Freq: Two times a day (BID) | ORAL | 1 refills | Status: DC

## 2021-10-08 MED ORDER — METFORMIN HCL 500 MG PO TABS: 500.0000 mg | ORAL_TABLET | Freq: Two times a day (BID) | ORAL | 3 refills | Status: DC

## 2021-10-08 MED ORDER — PIOGLITAZONE HCL 30 MG PO TABS: 30.0000 mg | ORAL_TABLET | Freq: Every day | ORAL | 3 refills | Status: DC

## 2021-10-08 MED ORDER — ONDANSETRON HCL 4 MG PO TABS: 4.0000 mg | ORAL_TABLET | Freq: Three times a day (TID) | ORAL | 5 refills | Status: DC | PRN

## 2021-10-08 MED ORDER — OMEPRAZOLE 40 MG PO CPDR: 40.0000 mg | DELAYED_RELEASE_CAPSULE | Freq: Two times a day (BID) | ORAL | 1 refills | Status: DC

## 2021-10-08 NOTE — Patient Instructions (Addendum)
Go to the lab on the way out.   If you have mychart we'll likely use that to update you.   See if you can add 1/2 tab of crestor weekly.   Take care.  Glad to see you. Plan on recheck in about 4 months.  A1c/labs at the visit.

## 2021-10-08 NOTE — Progress Notes (Signed)
This visit occurred during the SARS-CoV-2 public health emergency.  Safety protocols were in place, including screening questions prior to the visit, additional usage of staff PPE, and extensive cleaning of exam room while observing appropriate contact time as indicated for disinfecting solutions.  Diabetes:  Using medications without difficulties: yes except for occ GI upset- that is manageable.   Hypoglycemic episodes: no Hyperglycemic episodes: no Feet problems: still with baseline foot pain. On lyrica at baseline.   Blood Sugars averaging: higher in the AM if snacking prior to bed.  120-130 vs 140-150 eye exam within last year: due, d/w pt.   A1c d/w pt at OV.  He is taking crestor 1 tab a week.  He quit it after knee surgery then tried to restart med.  Gradually inc'd to 1 tab a week.    He cut back on coffee and limited trigger foods for GERD- that helped some.  Still on PPI BID.  S/p endoscopy.  He'll update me as needed, if not better with continued trigger avoidance.  No blood in stool.  No hematemesis.    Meds, vitals, and allergies reviewed.  ROS: Per HPI unless specifically indicated in ROS section   GEN: nad, alert and oriented HEENT: ncat NECK: supple w/o LA CV: rrr. PULM: ctab, no inc wob ABD: soft, +bs EXT: no edema SKIN: well perfused.

## 2021-10-11 NOTE — Assessment & Plan Note (Signed)
Plan on recheck in about 4 months.  A1c/labs at the visit.   Continue glimepiride metformin and Actos in the meantime.

## 2021-10-11 NOTE — Assessment & Plan Note (Signed)
Improved with trigger avoidance.  He can continue Prilosec and update me as needed.

## 2021-10-11 NOTE — Assessment & Plan Note (Signed)
See notes on labs.  Discussed with patient about trying to add on 1/2 tablet of Crestor per week.  See after visit summary.

## 2021-10-22 DIAGNOSIS — M25512 Pain in left shoulder: Secondary | ICD-10-CM | POA: Diagnosis not present

## 2021-11-04 DIAGNOSIS — M25512 Pain in left shoulder: Secondary | ICD-10-CM | POA: Diagnosis not present

## 2021-11-16 ENCOUNTER — Other Ambulatory Visit: Payer: Self-pay | Admitting: Family Medicine

## 2021-11-22 ENCOUNTER — Other Ambulatory Visit: Payer: Self-pay | Admitting: Family Medicine

## 2021-12-03 DIAGNOSIS — M7542 Impingement syndrome of left shoulder: Secondary | ICD-10-CM | POA: Diagnosis not present

## 2021-12-03 DIAGNOSIS — M7552 Bursitis of left shoulder: Secondary | ICD-10-CM | POA: Diagnosis not present

## 2021-12-03 DIAGNOSIS — M67814 Other specified disorders of tendon, left shoulder: Secondary | ICD-10-CM | POA: Diagnosis not present

## 2021-12-03 DIAGNOSIS — G8918 Other acute postprocedural pain: Secondary | ICD-10-CM | POA: Diagnosis not present

## 2021-12-03 DIAGNOSIS — M24112 Other articular cartilage disorders, left shoulder: Secondary | ICD-10-CM | POA: Diagnosis not present

## 2021-12-03 DIAGNOSIS — S43432A Superior glenoid labrum lesion of left shoulder, initial encounter: Secondary | ICD-10-CM | POA: Diagnosis not present

## 2021-12-03 DIAGNOSIS — M75102 Unspecified rotator cuff tear or rupture of left shoulder, not specified as traumatic: Secondary | ICD-10-CM | POA: Diagnosis not present

## 2021-12-03 DIAGNOSIS — M7522 Bicipital tendinitis, left shoulder: Secondary | ICD-10-CM | POA: Diagnosis not present

## 2021-12-03 DIAGNOSIS — M75122 Complete rotator cuff tear or rupture of left shoulder, not specified as traumatic: Secondary | ICD-10-CM | POA: Diagnosis not present

## 2021-12-15 DIAGNOSIS — E119 Type 2 diabetes mellitus without complications: Secondary | ICD-10-CM | POA: Diagnosis not present

## 2021-12-15 LAB — HM DIABETES EYE EXAM

## 2021-12-16 DIAGNOSIS — M75102 Unspecified rotator cuff tear or rupture of left shoulder, not specified as traumatic: Secondary | ICD-10-CM | POA: Diagnosis not present

## 2022-01-19 DIAGNOSIS — S46012D Strain of muscle(s) and tendon(s) of the rotator cuff of left shoulder, subsequent encounter: Secondary | ICD-10-CM | POA: Diagnosis not present

## 2022-01-19 DIAGNOSIS — M25612 Stiffness of left shoulder, not elsewhere classified: Secondary | ICD-10-CM | POA: Diagnosis not present

## 2022-01-19 DIAGNOSIS — M25512 Pain in left shoulder: Secondary | ICD-10-CM | POA: Diagnosis not present

## 2022-01-19 DIAGNOSIS — M6281 Muscle weakness (generalized): Secondary | ICD-10-CM | POA: Diagnosis not present

## 2022-01-19 DIAGNOSIS — S46112D Strain of muscle, fascia and tendon of long head of biceps, left arm, subsequent encounter: Secondary | ICD-10-CM | POA: Diagnosis not present

## 2022-01-21 DIAGNOSIS — M25512 Pain in left shoulder: Secondary | ICD-10-CM | POA: Diagnosis not present

## 2022-01-21 DIAGNOSIS — S46112D Strain of muscle, fascia and tendon of long head of biceps, left arm, subsequent encounter: Secondary | ICD-10-CM | POA: Diagnosis not present

## 2022-01-21 DIAGNOSIS — M6281 Muscle weakness (generalized): Secondary | ICD-10-CM | POA: Diagnosis not present

## 2022-01-21 DIAGNOSIS — S46012D Strain of muscle(s) and tendon(s) of the rotator cuff of left shoulder, subsequent encounter: Secondary | ICD-10-CM | POA: Diagnosis not present

## 2022-01-21 DIAGNOSIS — M25612 Stiffness of left shoulder, not elsewhere classified: Secondary | ICD-10-CM | POA: Diagnosis not present

## 2022-01-27 DIAGNOSIS — M25612 Stiffness of left shoulder, not elsewhere classified: Secondary | ICD-10-CM | POA: Diagnosis not present

## 2022-01-27 DIAGNOSIS — M6281 Muscle weakness (generalized): Secondary | ICD-10-CM | POA: Diagnosis not present

## 2022-01-27 DIAGNOSIS — S46112D Strain of muscle, fascia and tendon of long head of biceps, left arm, subsequent encounter: Secondary | ICD-10-CM | POA: Diagnosis not present

## 2022-01-27 DIAGNOSIS — M25512 Pain in left shoulder: Secondary | ICD-10-CM | POA: Diagnosis not present

## 2022-01-27 DIAGNOSIS — S46012D Strain of muscle(s) and tendon(s) of the rotator cuff of left shoulder, subsequent encounter: Secondary | ICD-10-CM | POA: Diagnosis not present

## 2022-01-29 DIAGNOSIS — M25512 Pain in left shoulder: Secondary | ICD-10-CM | POA: Diagnosis not present

## 2022-01-29 DIAGNOSIS — S46012D Strain of muscle(s) and tendon(s) of the rotator cuff of left shoulder, subsequent encounter: Secondary | ICD-10-CM | POA: Diagnosis not present

## 2022-01-29 DIAGNOSIS — M6281 Muscle weakness (generalized): Secondary | ICD-10-CM | POA: Diagnosis not present

## 2022-01-29 DIAGNOSIS — S46112D Strain of muscle, fascia and tendon of long head of biceps, left arm, subsequent encounter: Secondary | ICD-10-CM | POA: Diagnosis not present

## 2022-01-29 DIAGNOSIS — M25612 Stiffness of left shoulder, not elsewhere classified: Secondary | ICD-10-CM | POA: Diagnosis not present

## 2022-02-01 DIAGNOSIS — M6281 Muscle weakness (generalized): Secondary | ICD-10-CM | POA: Diagnosis not present

## 2022-02-01 DIAGNOSIS — M25512 Pain in left shoulder: Secondary | ICD-10-CM | POA: Diagnosis not present

## 2022-02-01 DIAGNOSIS — M25612 Stiffness of left shoulder, not elsewhere classified: Secondary | ICD-10-CM | POA: Diagnosis not present

## 2022-02-01 DIAGNOSIS — S46112D Strain of muscle, fascia and tendon of long head of biceps, left arm, subsequent encounter: Secondary | ICD-10-CM | POA: Diagnosis not present

## 2022-02-01 DIAGNOSIS — S46012D Strain of muscle(s) and tendon(s) of the rotator cuff of left shoulder, subsequent encounter: Secondary | ICD-10-CM | POA: Diagnosis not present

## 2022-02-08 ENCOUNTER — Encounter: Payer: Self-pay | Admitting: Pharmacist

## 2022-02-08 DIAGNOSIS — M25512 Pain in left shoulder: Secondary | ICD-10-CM | POA: Diagnosis not present

## 2022-02-08 DIAGNOSIS — S46012D Strain of muscle(s) and tendon(s) of the rotator cuff of left shoulder, subsequent encounter: Secondary | ICD-10-CM | POA: Diagnosis not present

## 2022-02-08 DIAGNOSIS — S46112D Strain of muscle, fascia and tendon of long head of biceps, left arm, subsequent encounter: Secondary | ICD-10-CM | POA: Diagnosis not present

## 2022-02-08 DIAGNOSIS — M6281 Muscle weakness (generalized): Secondary | ICD-10-CM | POA: Diagnosis not present

## 2022-02-08 DIAGNOSIS — M25612 Stiffness of left shoulder, not elsewhere classified: Secondary | ICD-10-CM | POA: Diagnosis not present

## 2022-02-08 NOTE — Progress Notes (Signed)
[facility])  ?                                          [facility] Quality Pharmacy Team  ?                                      Statin Quality Measure Assessment ?  ? ?02/08/2022 ? ?Keyler Damita Lack ?[birth year] ?[id number] ? ? ?Per review of chart and payor information, patient has a diagnosis of diabetes but is not currently filling a statin prescription.  This places patient into the SUPD (Statin Use In Patients with Diabetes) measure for CMS.   ? ?Mr. Dosanjh is prescribed Rosuvastatin 10 mg, however has not filed a prescription for the medication since 2021. Prescription was changed from once by mouth daily to weekly in 2022.  ? ?The 10-year ASCVD risk score (Arnett DK, et al., 2019) is: 26.5% ?  Values used to calculate the score: ?    Age: 66 years ?    Sex: Male ?    Is Non-Hispanic African American: No ?    Diabetic: Yes ?    Tobacco smoker: No ?    Systolic Blood Pressure: 867 mmHg ?    Is BP treated: Yes ?    HDL Cholesterol: 58.1 mg/dL ?    Total Cholesterol: 204 mg/dL ?10/08/2021 ? ?   ?Component Value Date/Time  ? CHOL 204 (H) 10/08/2021 1246  ? TRIG 80.0 10/08/2021 1246  ? HDL 58.10 10/08/2021 1246  ? CHOLHDL 4 10/08/2021 1246  ? VLDL 16.0 10/08/2021 1246  ? LDLCALC 129 (H) 10/08/2021 1246  ? LDLDIRECT 142.9 06/21/2011 0908  ? ? ?Please consider the following recommendation:  ?Assess patient tolerance with statin regimen and send new prescription to match how patient is to continue taking rosuvastatin.  ? ?Recommendation for prescribing an alternative frequency statin dose ?1x weekly, #13, 3 refills  ?2x weekly, #26, 3 refills  ?3x weekly, #39, 3 refills  ? ? ?Loretha Brasil, PharmD ?[facility] ?Clinical Pharmacist ?Office: [phone] ? ? ? ? ?

## 2022-02-09 ENCOUNTER — Encounter: Payer: Self-pay | Admitting: Family Medicine

## 2022-02-09 ENCOUNTER — Other Ambulatory Visit: Payer: Self-pay

## 2022-02-09 ENCOUNTER — Ambulatory Visit (INDEPENDENT_AMBULATORY_CARE_PROVIDER_SITE_OTHER): Payer: Medicare Other | Admitting: Family Medicine

## 2022-02-09 VITALS — BP 156/68 | HR 68 | Temp 97.7°F | Ht 70.0 in | Wt 190.0 lb

## 2022-02-09 DIAGNOSIS — E1149 Type 2 diabetes mellitus with other diabetic neurological complication: Secondary | ICD-10-CM | POA: Diagnosis not present

## 2022-02-09 DIAGNOSIS — E785 Hyperlipidemia, unspecified: Secondary | ICD-10-CM | POA: Diagnosis not present

## 2022-02-09 DIAGNOSIS — N529 Male erectile dysfunction, unspecified: Secondary | ICD-10-CM | POA: Diagnosis not present

## 2022-02-09 LAB — TESTOSTERONE: Testosterone: 167.96 ng/dL — ABNORMAL LOW (ref 300.00–890.00)

## 2022-02-09 LAB — HEMOGLOBIN A1C: Hgb A1c MFr Bld: 6.8 % — ABNORMAL HIGH (ref 4.6–6.5)

## 2022-02-09 MED ORDER — ROSUVASTATIN CALCIUM 10 MG PO TABS: ORAL_TABLET | ORAL | Status: DC

## 2022-02-09 NOTE — Patient Instructions (Addendum)
Go to the lab on the way out.   If you have mychart we'll likely use that to update you.    ?Plan on yearly visit this summer, in about 4 months.  Labs ahead of time if possible.   ?Take care.  Glad to see you. ?

## 2022-02-09 NOTE — Progress Notes (Signed)
This visit occurred during the SARS-CoV-2 public health emergency.  Safety protocols were in place, including screening questions prior to the visit, additional usage of staff PPE, and extensive cleaning of exam room while observing appropriate contact time as indicated for disinfecting solutions. ? ?Diabetes:  ?Using medications without difficulties: yes ?Hypoglycemic episodes:no ?Hyperglycemic episodes:no ?Feet problems: neuropathy at baseline.  He has been careful about keeping L 1st nail (lateral edge) from getting ingrown.   ?Blood Sugars averaging: usually ~120-140 ?eye exam within last year: yes ?A1c pending.  ? ?He was asking about checking his testosterone.  He had HA with levtira and sildenafil.  ED d/w pt, meds helped only a little.   ? ?He had outside imaging re: L shoulder with surgery done end of 2023.  In PT and his shoulder is gradually getting better.   ? ?He was able to gradually inc his crestor to 1.5 tabs in a week, total dose.   ? ?Meds, vitals, and allergies reviewed.  ? ?ROS: Per HPI unless specifically indicated in ROS section  ? ?GEN: nad, alert and oriented ?HEENT: ncat ?NECK: supple w/o LA ?CV: rrr. ?PULM: ctab, no inc wob ?ABD: soft, +bs ?EXT: no edema ?SKIN: well perfused.  ? ?Diabetic foot exam: ?Normal inspection ?No skin breakdown ?No calluses  ?Normal DP pulses ?Normal sensation to light touch and monofilament ?Nails normal- not ingrown.   ?He had B dropped MT heads.   ?

## 2022-02-10 ENCOUNTER — Other Ambulatory Visit: Payer: Self-pay | Admitting: Family Medicine

## 2022-02-10 DIAGNOSIS — E291 Testicular hypofunction: Secondary | ICD-10-CM

## 2022-02-10 DIAGNOSIS — N529 Male erectile dysfunction, unspecified: Secondary | ICD-10-CM

## 2022-02-10 NOTE — Assessment & Plan Note (Signed)
? ?  He was asking about checking his testosterone.  He had HA with levtira and sildenafil.  ED d/w pt, meds helped only a little.  Check T level, see notes on labs.  ?

## 2022-02-10 NOTE — Assessment & Plan Note (Signed)
See notes on labs. Continue lyrica. Continue meds below.   ?Recheck periodically.  Foot care d/w pt.  Doesn't need nail removal now.   ? ?Current Outpatient Medications on File Prior to Visit  ?Medication Sig Dispense Refill  ?? Alpha Lipoic Acid 200 MG CAPS Take 600 mg by mouth daily.    ?? Ascorbic Acid (VITAMIN C) 500 MG CAPS Take 1 each by mouth daily.    ?? aspirin 81 MG EC tablet Take 81 mg by mouth daily.    ?? Coenzyme Q10 (CO Q-10) 100 MG CAPS Take 200 mg by mouth daily.    ?? cycloSPORINE (RESTASIS) 0.05 % ophthalmic emulsion Place 1 drop into the right eye daily. 1.5 mL 0  ?? etodolac (LODINE) 500 MG tablet Take 1 tablet (500 mg total) by mouth 2 (two) times daily with a meal. 180 tablet 3  ?? fish oil-omega-3 fatty acids 1000 MG capsule Take 2 g by mouth daily. 1200 mg daily.    ?? Ginkgo Biloba 60 MG CAPS Take 1 each by mouth daily.    ?? glimepiride (AMARYL) 2 MG tablet Take 1 tablet (2 mg total) by mouth daily. 90 tablet 3  ?? losartan (COZAAR) 25 MG tablet Take 1/2 (one-half) tablet by mouth once daily 45 tablet 2  ?? metFORMIN (GLUCOPHAGE) 500 MG tablet Take 1 tablet (500 mg total) by mouth 2 (two) times daily with a meal. 180 tablet 3  ?? Multiple Vitamin (MULTIVITAMIN) tablet Take 1 tablet by mouth daily.    ?? omeprazole (PRILOSEC) 40 MG capsule Take 1 capsule (40 mg total) by mouth 2 (two) times daily. 90 capsule 1  ?? ondansetron (ZOFRAN) 4 MG tablet Take 1 tablet (4 mg total) by mouth every 8 (eight) hours as needed for nausea or vomiting. 30 tablet 5  ?? ONETOUCH VERIO test strip USE 1 STRIP TO CHECK GLUCOSE TWICE DAILY AS NEEDED 100 each 2  ?? pioglitazone (ACTOS) 30 MG tablet Take 1 tablet by mouth once daily 90 tablet 0  ?? pregabalin (LYRICA) 75 MG capsule Take 1 capsule (75 mg total) by mouth 2 (two) times daily. 180 capsule 1  ?? vardenafil (LEVITRA) 10 MG tablet Take 1 tablet (10 mg total) by mouth daily as needed for erectile dysfunction. 18 tablet 3  ? ?No current  facility-administered medications on file prior to visit.  ? ? ?

## 2022-02-11 DIAGNOSIS — M6281 Muscle weakness (generalized): Secondary | ICD-10-CM | POA: Diagnosis not present

## 2022-02-11 DIAGNOSIS — M25512 Pain in left shoulder: Secondary | ICD-10-CM | POA: Diagnosis not present

## 2022-02-11 DIAGNOSIS — S46112D Strain of muscle, fascia and tendon of long head of biceps, left arm, subsequent encounter: Secondary | ICD-10-CM | POA: Diagnosis not present

## 2022-02-11 DIAGNOSIS — M25612 Stiffness of left shoulder, not elsewhere classified: Secondary | ICD-10-CM | POA: Diagnosis not present

## 2022-02-11 DIAGNOSIS — S46012D Strain of muscle(s) and tendon(s) of the rotator cuff of left shoulder, subsequent encounter: Secondary | ICD-10-CM | POA: Diagnosis not present

## 2022-02-12 DIAGNOSIS — M25512 Pain in left shoulder: Secondary | ICD-10-CM | POA: Diagnosis not present

## 2022-02-15 ENCOUNTER — Other Ambulatory Visit (INDEPENDENT_AMBULATORY_CARE_PROVIDER_SITE_OTHER): Payer: Medicare Other

## 2022-02-15 ENCOUNTER — Other Ambulatory Visit: Payer: Self-pay

## 2022-02-15 DIAGNOSIS — E291 Testicular hypofunction: Secondary | ICD-10-CM | POA: Diagnosis not present

## 2022-02-15 DIAGNOSIS — M6281 Muscle weakness (generalized): Secondary | ICD-10-CM | POA: Diagnosis not present

## 2022-02-15 DIAGNOSIS — M25512 Pain in left shoulder: Secondary | ICD-10-CM | POA: Diagnosis not present

## 2022-02-15 DIAGNOSIS — N529 Male erectile dysfunction, unspecified: Secondary | ICD-10-CM

## 2022-02-15 DIAGNOSIS — S46112D Strain of muscle, fascia and tendon of long head of biceps, left arm, subsequent encounter: Secondary | ICD-10-CM | POA: Diagnosis not present

## 2022-02-15 DIAGNOSIS — M25612 Stiffness of left shoulder, not elsewhere classified: Secondary | ICD-10-CM | POA: Diagnosis not present

## 2022-02-15 DIAGNOSIS — S46012D Strain of muscle(s) and tendon(s) of the rotator cuff of left shoulder, subsequent encounter: Secondary | ICD-10-CM | POA: Diagnosis not present

## 2022-02-15 LAB — TESTOSTERONE: Testosterone: 224.79 ng/dL — ABNORMAL LOW (ref 300.00–890.00)

## 2022-02-15 LAB — LUTEINIZING HORMONE: LH: 3.57 m[IU]/mL (ref 1.50–9.30)

## 2022-02-17 ENCOUNTER — Other Ambulatory Visit: Payer: Self-pay

## 2022-02-17 DIAGNOSIS — S46112D Strain of muscle, fascia and tendon of long head of biceps, left arm, subsequent encounter: Secondary | ICD-10-CM | POA: Diagnosis not present

## 2022-02-17 DIAGNOSIS — S46012D Strain of muscle(s) and tendon(s) of the rotator cuff of left shoulder, subsequent encounter: Secondary | ICD-10-CM | POA: Diagnosis not present

## 2022-02-17 DIAGNOSIS — M25512 Pain in left shoulder: Secondary | ICD-10-CM | POA: Diagnosis not present

## 2022-02-17 DIAGNOSIS — M25612 Stiffness of left shoulder, not elsewhere classified: Secondary | ICD-10-CM | POA: Diagnosis not present

## 2022-02-17 DIAGNOSIS — M6281 Muscle weakness (generalized): Secondary | ICD-10-CM | POA: Diagnosis not present

## 2022-02-17 MED ORDER — PIOGLITAZONE HCL 30 MG PO TABS: 30.0000 mg | ORAL_TABLET | Freq: Every day | ORAL | 1 refills | Status: DC

## 2022-02-24 ENCOUNTER — Other Ambulatory Visit: Payer: Self-pay | Admitting: Family Medicine

## 2022-02-24 DIAGNOSIS — M25512 Pain in left shoulder: Secondary | ICD-10-CM | POA: Diagnosis not present

## 2022-02-24 DIAGNOSIS — S46112D Strain of muscle, fascia and tendon of long head of biceps, left arm, subsequent encounter: Secondary | ICD-10-CM | POA: Diagnosis not present

## 2022-02-24 DIAGNOSIS — M25612 Stiffness of left shoulder, not elsewhere classified: Secondary | ICD-10-CM | POA: Diagnosis not present

## 2022-02-24 DIAGNOSIS — M6281 Muscle weakness (generalized): Secondary | ICD-10-CM | POA: Diagnosis not present

## 2022-02-24 DIAGNOSIS — E291 Testicular hypofunction: Secondary | ICD-10-CM

## 2022-02-24 DIAGNOSIS — S46012D Strain of muscle(s) and tendon(s) of the rotator cuff of left shoulder, subsequent encounter: Secondary | ICD-10-CM | POA: Diagnosis not present

## 2022-03-04 ENCOUNTER — Encounter: Payer: Self-pay | Admitting: Urology

## 2022-03-04 ENCOUNTER — Ambulatory Visit: Payer: Medicare Other | Admitting: Urology

## 2022-03-04 VITALS — BP 181/74 | HR 55 | Ht 70.0 in | Wt 190.0 lb

## 2022-03-04 DIAGNOSIS — E291 Testicular hypofunction: Secondary | ICD-10-CM

## 2022-03-04 MED ORDER — TESTOSTERONE 20.25 MG/ACT (1.62%) TD GEL: TRANSDERMAL | 1 refills | Status: DC

## 2022-03-04 NOTE — Progress Notes (Signed)
? ?03/04/2022 ?1:02 PM  ? ?Ian Johnson ?[birth year] ?[id number] ? ?Referring provider: Tonia Ghent, MD ?[address] ?[place],  [place] [address] ? ?Chief Complaint  ?Patient presents with  ? Hypogonadism  ? ? ?HPI: ?Ian Johnson is a 66 y.o. male referred for evaluation of hypogonadism. ? ?He asked his PCP to check testosterone level due to difficulty achieving and maintaining an erection ?He has tried a PDE 5 inhibitor which was effective initially however the efficacy has decreased ?He also notes increased tiredness, fatigue and decreased libido ?Symptoms have been present for several years but worse the past 3-4 years ?No bothersome LUTS ?Denies gross hematuria or flank/abdominal/pelvic pain ?No history of sleep apnea ?Testosterone levels 02/09/2022 168 ng/dL and 225 ng/dL on 02/15/2022; LH low normal 3.57; PSA 05/2021 was 0.28 ? ? ?PMH: ?Past Medical History:  ?Diagnosis Date  ? Adhesive capsulitis of right shoulder 11/09/2013  ? Arthritis   ? Backache, unspecified   ? Blind left eye 1973  ? accident -glass eye  ? Complex tear of medial meniscus of left knee as current injury 01/19/2013  ? Diabetes mellitus without mention of complication   ? Esophageal reflux   ? Nonspecific elevation of levels of transaminase or lactic acid dehydrogenase (LDH)   ? Other and unspecified hyperlipidemia   ? Peripheral neuropathy   ? Personal history of urinary calculi   ? Unspecified essential hypertension   ? no meds now  ? Wears glasses   ? ? ?Surgical History: ?Past Surgical History:  ?Procedure Laterality Date  ? CHONDROPLASTY Left 01/19/2013  ? Procedure: CHONDROPLASTY;  Surgeon: Johnny Bridge, MD;  Location: [place];  Service: Orthopedics;  Laterality: Left;  ARTHROSCOPY KNEE WITH DEBRIDEMENT/SHAVING (CHONDROPLASTY)  ? COLONOSCOPY    ? EYE SURGERY  [phone]  ? multiple lt eye reconstruction surg -glass eye  ? KNEE ARTHROSCOPY W/ MENISCAL REPAIR Right   ? 2016  ? KNEE ARTHROSCOPY WITH MEDIAL  MENISECTOMY Left 01/19/2013  ? Procedure: KNEE ARTHROSCOPY WITH MEDIAL MENISECTOMY;  Surgeon: Johnny Bridge, MD;  Location: [facility];  Service: Orthopedics;  Laterality: Left;  LEFT KNEE SCOPE WITH DEBRIDEMENT AND partial MEDIAL MENISECTOMY  ? [place] SURGERY  12/06/1988  ? l4 and l5  ? SHOULDER ARTHROSCOPY WITH ROTATOR CUFF REPAIR Left   ? SHOULDER ARTHROSCOPY WITH ROTATOR CUFF REPAIR AND SUBACROMIAL DECOMPRESSION Right 11/09/2013  ? Procedure: RIGHT SHOULDER ARTHROSCOPY WITH DEBRIDEMENT OF PARTIAL ROTATOR CUFF TEAR AND SUBACROMIAL DECOMPRESSION ;  Surgeon: Johnny Bridge, MD;  Location: [facility];  Service: Orthopedics;  Laterality: Right;  ? UPPER GI ENDOSCOPY    ? ? ?Home Medications:  ?Allergies as of 03/04/2022   ? ?   Reactions  ? Codeine   ? REACTION: Nausea  ? Crestor [rosuvastatin]   ? myalgia  ? Duloxetine Other (See Comments)  ? Intolerant per record review  ? Gabapentin Other (See Comments)  ? Effect for pain control wore off, became ineffective.   ? Phenergan [promethazine Hcl] Other (See Comments)  ? Sedation caution at '25mg'$ .  Was able to tolerate 12.'5mg'$ .    ? Venlafaxine   ? itching  ? ?  ? ?  ?Medication List  ?  ? ?  ? Accurate as of March 04, 2022  1:02 PM. If you have any questions, ask your nurse or doctor.  ?  ?  ? ?  ? ?Alpha Lipoic Acid 200 MG Caps ?Take 600 mg  by mouth daily. ?  ?aspirin 81 MG EC tablet ?Take 81 mg by mouth daily. ?  ?Co Q-10 100 MG Caps ?Take 200 mg by mouth daily. ?  ?cycloSPORINE 0.05 % ophthalmic emulsion ?Commonly known as: Restasis ?Place 1 drop into the right eye daily. ?  ?etodolac 500 MG tablet ?Commonly known as: LODINE ?Take 1 tablet (500 mg total) by mouth 2 (two) times daily with a meal. ?  ?fish oil-omega-3 fatty acids 1000 MG capsule ?Take 2 g by mouth daily. 1200 mg daily. ?  ?Ginkgo Biloba 60 MG Caps ?Take 1 each by mouth daily. ?  ?glimepiride 2 MG tablet ?Commonly known as: AMARYL ?Take 1 tablet (2 mg total) by mouth  daily. ?  ?losartan 25 MG tablet ?Commonly known as: COZAAR ?Take 1/2 (one-half) tablet by mouth once daily ?  ?metFORMIN 500 MG tablet ?Commonly known as: GLUCOPHAGE ?Take 1 tablet (500 mg total) by mouth 2 (two) times daily with a meal. ?  ?multivitamin tablet ?Take 1 tablet by mouth daily. ?  ?omeprazole 40 MG capsule ?Commonly known as: PRILOSEC ?Take 1 capsule (40 mg total) by mouth 2 (two) times daily. ?  ?ondansetron 4 MG tablet ?Commonly known as: Zofran ?Take 1 tablet (4 mg total) by mouth every 8 (eight) hours as needed for nausea or vomiting. ?  ?OneTouch Verio test strip ?Generic drug: glucose blood ?USE 1 STRIP TO CHECK GLUCOSE TWICE DAILY AS NEEDED ?  ?pioglitazone 30 MG tablet ?Commonly known as: ACTOS ?Take 1 tablet (30 mg total) by mouth daily. ?  ?pregabalin 75 MG capsule ?Commonly known as: LYRICA ?Take 1 capsule (75 mg total) by mouth 2 (two) times daily. ?  ?rosuvastatin 10 MG tablet ?Commonly known as: CRESTOR ?Take 1.5 tabs total weekly ?  ?vardenafil 10 MG tablet ?Commonly known as: LEVITRA ?Take 1 tablet (10 mg total) by mouth daily as needed for erectile dysfunction. ?  ?Vitamin C 500 MG Caps ?Take 1 each by mouth daily. ?  ? ?  ? ? ?Allergies:  ?Allergies  ?Allergen Reactions  ? Codeine   ?  REACTION: Nausea  ? Crestor [Rosuvastatin]   ?  myalgia  ? Duloxetine Other (See Comments)  ?  Intolerant per record review  ? Gabapentin Other (See Comments)  ?  Effect for pain control wore off, became ineffective.   ? Phenergan [Promethazine Hcl] Other (See Comments)  ?  Sedation caution at '25mg'$ .  Was able to tolerate 12.'5mg'$ .    ? Venlafaxine   ?  itching  ? ? ?Family History: ?Family History  ?Problem Relation Age of Onset  ? Coronary artery disease Mother   ? Coronary artery disease Father   ? Peripheral vascular disease Father   ? Prostate cancer Father   ? Colon cancer Neg Hx   ? ? ?Social History:  reports that he has never smoked. He has never used smokeless tobacco. He reports that he does  not drink alcohol and does not use drugs. ? ? ?Physical Exam: ?BP (!) 181/74   Pulse (!) 55   Ht '5\' 10"'$  (1.778 m)   Wt 190 lb (86.2 kg)   BMI 27.26 kg/m?   ?Constitutional:  Alert and oriented, No acute distress. ?HEENT: [place] AT, moist mucus membranes.  Trachea midline, no masses. ?Cardiovascular: No clubbing, cyanosis, or edema. ?Respiratory: Normal respiratory effort, no increased work of breathing. ?GI: Abdomen is soft, nontender, nondistended, no abdominal masses ?GU: Phallus uncircumcised without lesions, testes descended bilaterally out masses or tenderness.  Estimated volume ~ 15 cc.  Prostate 35 g, smooth without nodules ?Psychiatric: Normal mood and affect. ? ? ?Assessment & Plan:   ? ?1.  Hypogonadism ?Bothersome symptoms and he does desire a trial of TRT ?We discussed the most common delivery methods of topical gels and intramuscular injections.  Other options were discussed including Testopel, oral testosterone and testosterone patches ?We discussed various forms of testosterone placement including topical preparations, intramuscular injections, subcutaneous injections, subcutaneous pellet implantation and oral testosterone.  Pros and cons of each form were discussed.  The risk of transference of topical testosterone. ?I had an extensive discussion regarding testosterone replacement therapy including the following: Treatment may result in improvements in erectile function, low sex drive, anemia, bone mineral density, lean body mass, and depressive symptoms; evidence is inconclusive whether testosterone therapy improves cognitive function, measures of diabetes, energy, fatigue, lipid profiles, and quality of life measures; there is no conclusive evidence linking testosterone therapy to the development of prostate cancer; there is no definitive evidence linking testosterone therapy to a higher incidence of venothrombolic events; at the present time it cannot be stated definitively whether testosterone  therapy increases or decreases the risk of cardiovascular events including myocardial infarction and stroke. ?Potential side effects were discussed including erythrocytosis, gynecomastia.  The need for regular m

## 2022-03-16 ENCOUNTER — Other Ambulatory Visit: Payer: Self-pay | Admitting: Family Medicine

## 2022-04-07 DIAGNOSIS — M25512 Pain in left shoulder: Secondary | ICD-10-CM | POA: Diagnosis not present

## 2022-04-08 ENCOUNTER — Ambulatory Visit: Payer: Medicare Other | Admitting: Family Medicine

## 2022-04-20 DIAGNOSIS — C44212 Basal cell carcinoma of skin of right ear and external auricular canal: Secondary | ICD-10-CM | POA: Diagnosis not present

## 2022-04-20 DIAGNOSIS — C4441 Basal cell carcinoma of skin of scalp and neck: Secondary | ICD-10-CM | POA: Diagnosis not present

## 2022-04-27 ENCOUNTER — Other Ambulatory Visit: Payer: Self-pay | Admitting: Urology

## 2022-04-28 ENCOUNTER — Telehealth: Payer: Self-pay | Admitting: *Deleted

## 2022-04-28 DIAGNOSIS — E291 Testicular hypofunction: Secondary | ICD-10-CM

## 2022-04-28 NOTE — Telephone Encounter (Signed)
Notified patient as instructed, patient pleased. Discussed follow-up appointments, patient agrees  

## 2022-04-28 NOTE — Telephone Encounter (Signed)
-----   Message from Abbie Sons, MD sent at 04/27/2022  5:28 PM EDT ----- Regarding: Lab visit Started testosterone gel 6 weeks ago.  Needs a lab visit for testosterone level.  I refilled his med for 1 month only until we get the results

## 2022-05-06 ENCOUNTER — Other Ambulatory Visit: Payer: Medicare Other

## 2022-05-06 DIAGNOSIS — E291 Testicular hypofunction: Secondary | ICD-10-CM

## 2022-05-07 LAB — TESTOSTERONE: Testosterone: 120 ng/dL — ABNORMAL LOW (ref 264–916)

## 2022-05-07 NOTE — Telephone Encounter (Signed)
Testosterone level was low at 120.  Did he get started on testosterone gel and if so how much is he applying?

## 2022-05-26 DIAGNOSIS — C44212 Basal cell carcinoma of skin of right ear and external auricular canal: Secondary | ICD-10-CM | POA: Diagnosis not present

## 2022-05-28 ENCOUNTER — Other Ambulatory Visit: Payer: Self-pay | Admitting: Family Medicine

## 2022-06-07 DIAGNOSIS — M25512 Pain in left shoulder: Secondary | ICD-10-CM | POA: Diagnosis not present

## 2022-06-11 ENCOUNTER — Encounter: Payer: Self-pay | Admitting: Family Medicine

## 2022-06-11 ENCOUNTER — Ambulatory Visit (INDEPENDENT_AMBULATORY_CARE_PROVIDER_SITE_OTHER): Payer: Medicare Other | Admitting: Family Medicine

## 2022-06-11 VITALS — BP 140/82 | HR 54 | Temp 97.6°F | Ht 70.0 in | Wt 185.0 lb

## 2022-06-11 DIAGNOSIS — E785 Hyperlipidemia, unspecified: Secondary | ICD-10-CM

## 2022-06-11 DIAGNOSIS — E1149 Type 2 diabetes mellitus with other diabetic neurological complication: Secondary | ICD-10-CM

## 2022-06-11 LAB — POCT GLYCOSYLATED HEMOGLOBIN (HGB A1C): Hemoglobin A1C: 6.4 % — AB (ref 4.0–5.6)

## 2022-06-11 MED ORDER — ROSUVASTATIN CALCIUM 10 MG PO TABS: ORAL_TABLET | ORAL | Status: DC

## 2022-06-11 NOTE — Progress Notes (Unsigned)
Diabetes:  Using medications without difficulties: yes but occ diarrhea noted.   Hypoglycemic episodes:no Hyperglycemic episodes:no Feet problems: neuropathy at baseline.   Blood Sugars averaging: 120-140 prev, no 140-160 in the AM over the last few months.   eye exam within last year: yes He is still putting up with more pain at night.  He is tolerating the situation with lyrica as is.    He was able to tolerate '10mg'$  crestor, 2 tabs per week.  Some nocturnal cramping.  Need to ask pharmacy about PSK options.    Meds, vitals, and allergies reviewed.   ROS: Per HPI unless specifically indicated in ROS section   GEN: nad, alert and oriented HEENT: ncat NECK: supple w/o LA CV: rrr. PULM: ctab, no inc wob ABD: soft, +bs EXT: no edema SKIN: no acute rash

## 2022-06-11 NOTE — Patient Instructions (Addendum)
I'll check with pharmacy about repatha or similar (PSK inhibitor). Take care.  Glad to see you.  Schedule a yearly medicare visit in about 3 months with labs ahead of time.   If your sugars are higher in the meantime then let me know.

## 2022-06-13 NOTE — Assessment & Plan Note (Signed)
Continue statin as is for now but I will check with pharmacy to see if he would be a candidate for PSK inhibitor.  Discussed.  Referral placed.  A1c is reasonable for now.  No change in meds at this point otherwise.  Continue glimepiride metformin and Actos.  Continue Lyrica as is, he is putting up with neuropathy symptoms.  See after visit summary.

## 2022-06-15 ENCOUNTER — Other Ambulatory Visit: Payer: Self-pay | Admitting: Family Medicine

## 2022-06-15 DIAGNOSIS — I10 Essential (primary) hypertension: Secondary | ICD-10-CM

## 2022-06-17 ENCOUNTER — Other Ambulatory Visit: Payer: Self-pay | Admitting: Urology

## 2022-06-18 ENCOUNTER — Telehealth: Payer: Self-pay | Admitting: *Deleted

## 2022-06-18 ENCOUNTER — Telehealth: Payer: Self-pay | Admitting: Family Medicine

## 2022-06-18 DIAGNOSIS — E291 Testicular hypofunction: Secondary | ICD-10-CM

## 2022-06-18 NOTE — Telephone Encounter (Signed)
Talked with patient and he states he has been applying the gel daily. He is almost out of the gel.

## 2022-06-18 NOTE — Progress Notes (Signed)
  Chronic Care Management   Outreach Note  06/18/2022 Name: Ian Johnson MRN: [id number] DOB: [birth year]  Referred by: Tonia Ghent, MD Reason for referral : No chief complaint on file.   An unsuccessful telephone outreach was attempted today. The patient was referred to the pharmacist for assistance with care management and care coordination.   Follow Up Plan:   Tatjana Dellinger Upstream Scheduler

## 2022-06-18 NOTE — Telephone Encounter (Signed)
-----   Message from Abbie Sons, MD sent at 06/17/2022  5:04 PM EDT ----- Regarding: Testosterone Received a refill request for testosterone gel.  His level on 05/06/2022 was low at 120.  Was he applying the gel regularly at the time of this blood draw?

## 2022-06-21 NOTE — Telephone Encounter (Signed)
Pt LMOM says he's still waiting to hear back about his testosterone.

## 2022-06-22 ENCOUNTER — Telehealth: Payer: Self-pay | Admitting: Family Medicine

## 2022-06-22 NOTE — Telephone Encounter (Signed)
Left message for patient to call back and schedule Medicare Annual Wellness Visit (AWV) either virtually or phone   Last AWV ; 05/13/20   I left my direct # [phone]

## 2022-06-22 NOTE — Addendum Note (Signed)
Addended by: Chrystie Nose on: 06/22/2022 03:02 PM   Modules accepted: Orders

## 2022-06-22 NOTE — Telephone Encounter (Signed)
Refill has been sent.  Recommend increasing 2 pumps to each shoulder daily.  Schedule follow-up office visit in 2 months with testosterone, PSA, hematocrit prior

## 2022-06-22 NOTE — Telephone Encounter (Signed)
Notified patient as instructed, patient pleased. Discussed follow-up appointments, patient agrees  

## 2022-06-24 ENCOUNTER — Telehealth: Payer: Self-pay | Admitting: Family Medicine

## 2022-06-24 NOTE — Progress Notes (Signed)
  Chronic Care Management   Outreach Note  06/24/2022 Name: Ian Johnson MRN: [id number] DOB: 04-04-1956  Referred by: Tonia Ghent, MD Reason for referral : No chief complaint on file.   A second unsuccessful telephone outreach was attempted today. The patient was referred to pharmacist for assistance with care management and care coordination.  Follow Up Plan:   Tatjana Dellinger Upstream Scheduler

## 2022-07-02 ENCOUNTER — Telehealth: Payer: Self-pay | Admitting: Family Medicine

## 2022-07-02 NOTE — Progress Notes (Signed)
  Chronic Care Management   Outreach Note  07/02/2022 Name: Ian Johnson MRN: [id number] DOB: May 16, 1956  Referred by: Tonia Ghent, MD Reason for referral : No chief complaint on file.   Third unsuccessful telephone outreach was attempted today. The patient was referred to the pharmacist for assistance with care management and care coordination.   Follow Up Plan:   Tatjana Dellinger Upstream Scheduler

## 2022-07-29 ENCOUNTER — Telehealth: Payer: Self-pay

## 2022-07-29 NOTE — Progress Notes (Addendum)
    Chronic Care Management Pharmacy Assistant   Name: Ian Johnson  MRN: [id number] DOB: 07/04/1956  Reason for Encounter: CCM (Schedule Initial Visit)   Called patient to schedule him for initial telephone appointment with Charlene Brooke. Appointment is for cholesterol to discuss if patient is a candidate for alternative therapies other than statins. Patient has tentatively been scheduled for 08/11/2022 at 9:00 am. Will confirm with patient when I speak with him.   Called; no answer; left voicemail. Will try again.   Charlene Brooke, CPP notified  Marijean Niemann, [place] Clinical Pharmacy Assistant [phone]

## 2022-07-30 NOTE — Progress Notes (Signed)
Called patient to schedule him for initial telephone appointment with Charlene Brooke.  Patient has tentatively been scheduled for 08/11/2022 at 9:00 am.   Called; no answer; left voicemail.    Charlene Brooke, CPP notified   Marijean Niemann, [place] Clinical Pharmacy Assistant [phone]

## 2022-08-06 ENCOUNTER — Telehealth: Payer: Self-pay

## 2022-08-06 ENCOUNTER — Other Ambulatory Visit: Payer: Self-pay | Admitting: Family Medicine

## 2022-08-06 DIAGNOSIS — I10 Essential (primary) hypertension: Secondary | ICD-10-CM

## 2022-08-06 NOTE — Progress Notes (Signed)
Chronic Care Management Pharmacy Assistant   Name: Ian Johnson  MRN: [id number] DOB: 1955-12-22  Reason for Encounter: CCM (Initial Questions)  Recent office visits:  06/11/22 Ian Stain, MD DM A1c 6.4 Change: Rosuvastatin 10 mg 2 tabs vs 1.5 FU in 3 months 02/09/22 Ian Stain, MD DM A1c 6.8 Labs "A1c reasoanble at 6.8, slightly higher than prev but wouldn't change meds.  T level low.  Needs repeat AM level, early AM along with LH level.  I put in the orders.  If still low on recheck then likely needs uro eval." Change: Rosuvastatin 10 mg 1.5 tabs vs.1 tab. FU 4 months  Recent consult visits:  04/20/22 Ian Johnson (Dermatology) Basal cell carcinoma No other information 04/07/22 Ian Johnson (Orthopedic Surgery) Pain in left shoulder No other information 03/04/22 Ian Giovanni, MD (Urology) Hypogonadism Start: Testosterone 20.25 mg  02/12/22 Ian Johnson (Orthopedic Surgery) Pain in left shoulder No other information  Physical Therapy for stiffness of left shoulder: 02/24/22 02/17/22 02/15/22 02/12/22 02/11/2022  Hospital visits:  None in previous 6 months  Medications: Outpatient Encounter Medications as of 08/06/2022  Medication Sig   Alpha Lipoic Acid 200 MG CAPS Take 600 mg by mouth daily.   Ascorbic Acid (VITAMIN C) 500 MG CAPS Take 1 each by mouth daily.   aspirin 81 MG EC tablet Take 81 mg by mouth daily.   Coenzyme Q10 (CO Q-10) 100 MG CAPS Take 200 mg by mouth daily.   cycloSPORINE (RESTASIS) 0.05 % ophthalmic emulsion Place 1 drop into the right eye daily.   etodolac (LODINE) 500 MG tablet Take 1 tablet (500 mg total) by mouth 2 (two) times daily with a meal.   fish oil-omega-3 fatty acids 1000 MG capsule Take 2 g by mouth daily. 1200 mg daily.   Ginkgo Biloba 60 MG CAPS Take 1 each by mouth daily.   glimepiride (AMARYL) 2 MG tablet Take 1 tablet (2 mg total) by mouth daily.   losartan (COZAAR) 25 MG tablet Take 1/2 (one-half) tablet by mouth once daily    metFORMIN (GLUCOPHAGE) 500 MG tablet Take 1 tablet (500 mg total) by mouth 2 (two) times daily with a meal.   Multiple Vitamin (MULTIVITAMIN) tablet Take 1 tablet by mouth daily.   omeprazole (PRILOSEC) 40 MG capsule Take 1 capsule by mouth twice daily   ondansetron (ZOFRAN) 4 MG tablet Take 1 tablet (4 mg total) by mouth every 8 (eight) hours as needed for nausea or vomiting.   ONETOUCH VERIO test strip USE 1 STRIP TO CHECK GLUCOSE TWICE DAILY AS NEEDED   pioglitazone (ACTOS) 30 MG tablet Take 1 tablet (30 mg total) by mouth daily.   pregabalin (LYRICA) 75 MG capsule Take 1 capsule by mouth twice daily   rosuvastatin (CRESTOR) 10 MG tablet 2 tabs total weekly   Testosterone 1.62 % GEL APPLY 2 PUMPS TOPICALLY ONCE DAILY TO EACH SHOULDER (4 PUMPS TOTAL)   vardenafil (LEVITRA) 10 MG tablet Take 1 tablet (10 mg total) by mouth daily as needed for erectile dysfunction.   No facility-administered encounter medications on file as of 08/06/2022.   Lab Results  Component Value Date/Time   HGBA1C 6.4 (A) 06/11/2022 09:04 AM   HGBA1C 6.8 (H) 02/09/2022 10:51 AM   HGBA1C 6.1 (A) 10/08/2021 11:43 AM   HGBA1C 6.3 05/15/2021 08:46 AM   MICROALBUR 0.2 06/15/2012 04:45 PM     BP Readings from Last 3 Encounters:  06/11/22 140/82  03/04/22 (!) 181/74  02/09/22 (!) 156/68  Unsuccessful attempt to reach patient. Left patient message reminding patient of appointment.  Patient contacted to confirm telephone appointment with Ian Johnson, Pharm D, on 08/11/2022 at 9:30.  CCM referral has been placed prior to visit?  Yes   Star Rating Drugs: Medication:  Last Fill: Day Supply Losartan 25 mg 06/15/22 90 Metformin 500 mg 07/11/22 90 Rosuvastatin 10 mg Allergy discontinued 06/11/22  Care Gaps: Annual wellness visit in last year? Yes 09/14/2021 Most Recent BP reading: 140/82 on 06/11/2022  If Diabetic: Most recent A1C reading: 6.4 on 06/11/2022 Last eye exam / retinopathy screening: Up to  date Last diabetic foot exam: Up to date  Ian Johnson, CPP notified  Ian Johnson, [place] Assistant [phone]

## 2022-08-11 ENCOUNTER — Ambulatory Visit: Payer: Medicare Other | Admitting: Pharmacist

## 2022-08-11 ENCOUNTER — Telehealth: Payer: Self-pay

## 2022-08-11 DIAGNOSIS — I10 Essential (primary) hypertension: Secondary | ICD-10-CM

## 2022-08-11 DIAGNOSIS — E785 Hyperlipidemia, unspecified: Secondary | ICD-10-CM

## 2022-08-11 DIAGNOSIS — E1149 Type 2 diabetes mellitus with other diabetic neurological complication: Secondary | ICD-10-CM

## 2022-08-11 MED ORDER — REPATHA SURECLICK 140 MG/ML ~~LOC~~ SOAJ: 140.0000 mg | SUBCUTANEOUS | 3 refills | Status: DC

## 2022-08-11 MED ORDER — ROSUVASTATIN CALCIUM 10 MG PO TABS: ORAL_TABLET | ORAL | 0 refills | Status: DC

## 2022-08-11 NOTE — Telephone Encounter (Signed)
Yes patient agreed to continue rosuvastatin until he gets Repatha - will probably be about a month or so until we are able to get it through PAP.

## 2022-08-11 NOTE — Telephone Encounter (Signed)
Agree about starting repatha.  Thanks.  I signed the order.   I didn't d/c his crestor order yet. I wanted to make sure we were in agreement on that.  Thanks.

## 2022-08-11 NOTE — Addendum Note (Signed)
Addended by: Tonia Ghent on: 08/11/2022 03:05 PM   Modules accepted: Orders

## 2022-08-11 NOTE — Progress Notes (Signed)
Chronic Care Management Pharmacy Note  08/18/2022 Name:  Ian Johnson MRN:  [id number] DOB:  [birth year]  Summary: CCM Initial visit -HLD: reviewed LDL goal < 70 given comorbid DM and high ASCVD risk (34%); recent LDL 129 (10/2021) on rosuvastatin 10 mg twice a week and history of statin intolerance. Reviewed options for lipid control, pt opted for Repatha injections. He should qualify for patient assistance.  Recommendations/Changes made from today's visit: -Recommend Repatha 140 mg injection every 14 days - will pursue PAP through [place] -Repeat lipid panel 6-8 weeks after starting Repatha  Plan: -[place] will follow up [employer] PAP; schedule patient for [place] training with PharmD once he received medications -Pharmacist follow up visit TBD    Subjective: Ian Johnson is an 66 y.o. year old male who is a primary patient of Damita Dunnings, Elveria Rising, MD.  The CCM team was consulted for assistance with disease management and care coordination needs.    Engaged with patient by telephone for initial visit in response to provider referral for pharmacy case management and/or care coordination services.   Consent to Services:  The patient was given the following information about Chronic Care Management services today, agreed to services, and gave verbal consent: 1. CCM service includes personalized support from designated clinical staff supervised by the primary care provider, including individualized plan of care and coordination with other care providers 2. 24/7 contact phone numbers for assistance for urgent and routine care needs. 3. Service will only be billed when office clinical staff spend 20 minutes or more in a month to coordinate care. 4. Only one practitioner may furnish and bill the service in a calendar month. 5.The patient may stop CCM services at any time (effective at the end of the month) by phone call to the office staff. 6. The patient will be responsible for cost  sharing (co-pay) of up to 20% of the service fee (after annual deductible is met). Patient agreed to services and consent obtained.  Patient Care Team: Tonia Ghent, MD as PCP - General (Family Medicine) Lyla Glassing, MD as Referring Physician (Ophthalmology) Marchia Bond, MD as Consulting Physician (Orthopedic Surgery) Jarome Matin, MD as Consulting Physician (Dermatology) Solon Augusta, MD as Attending Physician (Ophthalmology) Laurence Spates, MD (Inactive) as Consulting Physician (Gastroenterology) Estill Cotta, MD (Ophthalmology) Charlton Haws, [facility] as Pharmacist (Pharmacist)  Recent office visits: 06/11/22 Elsie Stain, MD:  DM A1c 6.4 Change: Rosuvastatin 10 mg 2 tabs vs 1.5 FU in 3 months. Referred to pharmacy for PCSK9-i 02/09/22 Elsie Stain, MD DM A1c 6.8 Labs "A1c reasoanble at 6.8, slightly higher than prev but wouldn't change meds.  T level low.  Needs repeat AM level, early AM along with LH level.  I put in the orders.  If still low on recheck then likely needs uro eval." Change: Rosuvastatin 10 mg 1.5 tabs vs.1 tab. FU 4 months  Recent consult visits: 04/20/22 Jarome Matin (Dermatology) Basal cell carcinoma No other information 04/07/22 Marchia Bond (Orthopedic Surgery) Pain in left shoulder No other information 03/04/22 [name], MD (Urology) Hypogonadism Start: Testosterone 20.25 mg  02/12/22 Marchia Bond (Orthopedic Surgery) Pain in left shoulder No other information  Hospital visits: None in previous 6 months   Objective:  Lab Results  Component Value Date   CREATININE 0.93 05/15/2021   BUN 22 05/15/2021   GFR 85.41 05/15/2021   GFRNONAA >90 01/17/2013   GFRAA >90 01/17/2013   NA 139 05/15/2021   K 4.6 05/15/2021  CALCIUM 9.3 05/15/2021   CO2 29 05/15/2021   GLUCOSE 133 (H) 05/15/2021    Lab Results  Component Value Date/Time   HGBA1C 6.4 (A) 06/11/2022 09:04 AM   HGBA1C 6.8 (H) 02/09/2022 10:51 AM   HGBA1C 6.1 (A)  10/08/2021 11:43 AM   HGBA1C 6.3 05/15/2021 08:46 AM   GFR 85.41 05/15/2021 08:46 AM   GFR 95.00 09/25/2020 10:12 AM   MICROALBUR 0.2 06/15/2012 04:45 PM    Last diabetic Eye exam:  Lab Results  Component Value Date/Time   HMDIABEYEEXA No Retinopathy 12/15/2021 12:00 AM    Last diabetic Foot exam: No results found for: "HMDIABFOOTEX"   Lab Results  Component Value Date   CHOL 204 (H) 10/08/2021   HDL 58.10 10/08/2021   LDLCALC 129 (H) 10/08/2021   LDLDIRECT 142.9 06/21/2011   TRIG 80.0 10/08/2021   CHOLHDL 4 10/08/2021       Latest Ref Rng & Units 05/15/2021    8:46 AM 09/25/2020   10:12 AM 05/13/2020    8:59 AM  Hepatic Function  Total Protein 6.0 - 8.3 g/dL 7.4  6.8  7.3   Albumin 3.5 - 5.2 g/dL 4.6  4.3  4.7   AST 0 - 37 U/L 24  24  26    ALT 0 - 53 U/L 27  24  26    Alk Phosphatase 39 - 117 U/L 43  39  44   Total Bilirubin 0.2 - 1.2 mg/dL 0.9  0.9  1.2     Lab Results  Component Value Date/Time   TSH 3.09 05/15/2021 08:46 AM   TSH 1.74 09/25/2020 10:12 AM       Latest Ref Rng & Units 05/15/2021    8:46 AM 09/25/2020   10:12 AM 11/09/2013    7:35 AM  CBC  WBC 4.0 - 10.5 K/uL 4.3  3.6    Hemoglobin 13.0 - 17.0 g/dL 13.7  13.6  14.6   Hematocrit 39.0 - 52.0 % 40.2  40.5    Platelets 150.0 - 400.0 K/uL 188.0  193.0      No results found for: "VD25OH"  Clinical ASCVD: No  The 10-year ASCVD risk score (Arnett DK, et al., 2019) is: 34.8%   Values used to calculate the score:     Age: 66 years     Sex: Male     Is Non-Hispanic African American: No     Diabetic: Yes     Tobacco smoker: No     Systolic Blood Pressure: 762 mmHg     Is BP treated: Yes     HDL Cholesterol: 58.1 mg/dL     Total Cholesterol: 204 mg/dL       10/08/2021   11:42 AM 05/13/2020    9:56 AM 05/04/2019    9:45 AM  Depression screen PHQ 2/9  Decreased Interest 0 0 0  Down, Depressed, Hopeless 0 0 0  PHQ - 2 Score 0 0 0  Altered sleeping  0 0  Tired, decreased energy  0 0  Change in  appetite  0 0  Feeling bad or failure about yourself   0 0  Trouble concentrating  0 0  Moving slowly or fidgety/restless  0 0  Suicidal thoughts  0 0  PHQ-9 Score  0 0  Difficult doing work/chores  Not difficult at all Not difficult at all     Social History   Tobacco Use  Smoking Status Never  Smokeless Tobacco Never   BP Readings from Last 3  Encounters:  06/11/22 140/82  03/04/22 (!) 181/74  02/09/22 (!) 156/68   Pulse Readings from Last 3 Encounters:  06/11/22 (!) 54  03/04/22 (!) 55  02/09/22 68   Wt Readings from Last 3 Encounters:  06/11/22 185 lb (83.9 kg)  03/04/22 190 lb (86.2 kg)  02/09/22 190 lb (86.2 kg)   BMI Readings from Last 3 Encounters:  06/11/22 26.54 kg/m  03/04/22 27.26 kg/m  02/09/22 27.26 kg/m    Assessment/Interventions: Review of patient past medical history, allergies, medications, health status, including review of consultants reports, laboratory and other test data, was performed as part of comprehensive evaluation and provision of chronic care management services.   SDOH:  (Social Determinants of Health) assessments and interventions performed: Yes SDOH Interventions    Flowsheet Row Chronic Care Management from 08/11/2022 in [place] at [place] from 05/13/2020 in [place] at [place] from 05/04/2019 in [place] at [place]  SDOH Interventions     Transportation Interventions Intervention Not Indicated -- --  Depression Interventions/Treatment  -- EPP2-9 Score <4 Follow-up Not Indicated PHQ2-9 Score <4 Follow-up Not Indicated  Financial Strain Interventions Other (Comment)  [Repatha PAP pending] -- --      SDOH Screenings   Food Insecurity: No Food Insecurity (05/13/2020)  Housing: Low Risk  (05/13/2020)  Transportation Needs: No Transportation Needs (08/18/2022)  Alcohol Screen: Low Risk  (05/13/2020)  Depression (PHQ2-9): Low Risk  (10/08/2021)  Financial Resource  Strain: Low Risk  (08/18/2022)  Physical Activity: Insufficiently Active (05/13/2020)  Stress: No Stress Concern Present (05/13/2020)  Tobacco Use: Low Risk  (06/11/2022)    CCM Care Plan  Allergies  Allergen Reactions   Codeine     REACTION: Nausea   Crestor [Rosuvastatin]     myalgia   Duloxetine Other (See Comments)    Intolerant per record review   Gabapentin Other (See Comments)    Effect for pain control wore off, became ineffective.    Phenergan [Promethazine Hcl] Other (See Comments)    Sedation caution at 27m.  Was able to tolerate 12.55m     Venlafaxine     itching    Medications Reviewed Today     Reviewed by DuTonia GhentMD (Physician) on 06/11/22 at [phone] List Status: <None>   Medication Order Taking? Sig Documenting Provider Last Dose Status Informant  Alpha Lipoic Acid 200 MG CAPS [id number] Take 600 mg by mouth daily. [provider] Taking Active   Ascorbic Acid (VITAMIN C) 500 MG CAPS [id number] Take 1 each by mouth daily. [provider] Taking Active   aspirin 81 MG EC tablet [id number] Take 81 mg by mouth daily. [provider] Taking Active   Coenzyme Q10 (CO Q-10) 100 MG CAPS [id number] Take 200 mg by mouth daily. [provider] Taking Active   cycloSPORINE (RESTASIS) 0.05 % ophthalmic emulsion [id number] Place 1 drop into the right eye daily. DuTonia GhentMD Taking Active   etodolac (LODINE) 500 MG tablet [id number] Take 1 tablet (500 mg total) by mouth 2 (two) times daily with a meal. DuTonia GhentMD Taking Active   fish oil-omega-3 fatty acids 1000 MG capsule [id number] Take 2 g by mouth daily. 1200 mg daily. [provider] Taking Active   Ginkgo Biloba 60 MG CAPS [id number] Take 1 each by mouth daily. [provider] Taking Active   glimepiride (AMARYL) 2 MG tablet [id number]  Yes Take 1 tablet (2 mg total) by mouth daily. Tonia Ghent, MD Taking Active    losartan (COZAAR) 25 MG tablet [id number] Yes Take 1/2 (one-half) tablet by mouth once daily Tonia Ghent, MD Taking Active   metFORMIN (GLUCOPHAGE) 500 MG tablet [id number] Yes Take 1 tablet (500 mg total) by mouth 2 (two) times daily with a meal. Tonia Ghent, MD Taking Active   Multiple Vitamin (MULTIVITAMIN) tablet [id number] Yes Take 1 tablet by mouth daily. [provider] Taking Active   omeprazole (PRILOSEC) 40 MG capsule [id number] Yes Take 1 capsule by mouth twice daily Tonia Ghent, MD Taking Active   ondansetron [facility]) 4 MG tablet [id number] Yes Take 1 tablet (4 mg total) by mouth every 8 (eight) hours as needed for nausea or vomiting. Tonia Ghent, MD Taking Active   [facility] VERIO test strip [id number] Yes USE 1 STRIP TO CHECK GLUCOSE TWICE DAILY AS NEEDED Tonia Ghent, MD Taking Active   pioglitazone (ACTOS) 30 MG tablet [id number] Yes Take 1 tablet (30 mg total) by mouth daily. Tonia Ghent, MD Taking Active   pregabalin [facility]) 75 MG capsule [id number] Yes Take 1 capsule by mouth twice daily Tonia Ghent, MD Taking Active   rosuvastatin (CRESTOR) 10 MG tablet [id number] Yes 2 tabs total weekly Tonia Ghent, MD Taking Active   Testosterone 1.62 % GEL [id number] Yes APPLY 1 PUMP TOPICALLY TO EACH SHOULDER ONCE DAILY Stoioff, Ronda Fairly, MD Taking Active   vardenafil (LEVITRA) 10 MG tablet [id number] Yes Take 1 tablet (10 mg total) by mouth daily as needed for erectile dysfunction. Tonia Ghent, MD Taking Active             Patient Active Problem List   Diagnosis Date Noted   Weight loss 09/28/2020   Knee pain 05/18/2020   Healthcare maintenance 05/09/2019   FH: prostate cancer 07/05/2018   Nasal congestion 10/27/2016   Advance care planning 06/23/2016   Medicare annual wellness visit, subsequent 06/19/2015   ED (erectile dysfunction) 06/19/2014   Basal cell carcinoma of skin 04/08/2014   Adhesive capsulitis of right shoulder 11/09/2013    Leg cramps 06/18/2012   FOOT PAIN, BILATERAL 02/12/2011   Diabetes mellitus type 2 with neurological manifestations ([place]) 06/20/2009   Essential hypertension 11/18/2008   HLD (hyperlipidemia) 09/22/2007   GERD 09/22/2007   BACK PAIN 09/22/2007   NEPHROLITHIASIS, HX OF 09/22/2007    Immunization History  Administered Date(s) Administered   Fluad Quad(high Dose 65+) 09/25/2019, 09/25/2020   Influenza Split 10/15/2011, 10/09/2012   Influenza Whole 09/28/2002, 12/05/2007, 10/22/2008, 10/15/2010   Influenza,inj,Quad PF,6+ Mos 10/23/2013, 10/21/2014, 10/20/2015, 10/26/2016, 10/20/2017, 10/10/2018   Influenza-Unspecified 09/16/2021   PFIZER(Purple Top)SARS-COV-2 Vaccination 01/11/2020, 02/01/2020, 10/16/2020, 09/16/2021   Pneumococcal Conjugate-13 11/13/2013   Pneumococcal Polysaccharide-23 01/08/2014, 05/15/2020   Td 06/01/2006   Tdap 11/24/2017   Zoster Recombinat (Shingrix) 04/24/2021, 06/24/2021   Zoster, Live 11/26/2014    Conditions to be addressed/monitored:  Hypertension, Hyperlipidemia, and Diabetes  Care Plan : [place]  Updates made by Charlton Haws, [place] since 08/18/2022 12:00 AM     Problem: Hypertension, Hyperlipidemia, and Diabetes   Priority: High     Long-Range Goal: Disease mgmt   Start Date: 08/11/2022  Expected End Date: 02/16/2023  This Visit's Progress: On track  Priority: High  Note:   Current Barriers:  Suboptimal therapeutic regimen for HLD  Pharmacist Clinical Goal(s):  Patient will adhere to plan to optimize therapeutic regimen  for HLD as evidenced by report of adherence to recommended medication management changes through collaboration with PharmD and provider.   Interventions: 1:1 collaboration with Tonia Ghent, MD regarding development and update of comprehensive plan of care as evidenced by provider attestation and co-signature Inter-disciplinary care team collaboration (see longitudinal plan of care) Comprehensive  medication review performed; medication list updated in electronic medical record  Hypertension (BP goal <130/80) -Controlled - BP at goal  -Denies hypotensive/hypertensive symptoms -Current treatment: Losartan 25 mg -1/2 tab daily -Appropriate, Effective, Safe, Accessible -Medications previously tried: n/a  -Educated on BP goals and benefits of medications for prevention of heart attack, stroke and kidney damage; -Recommended to continue current medication  Hyperlipidemia: (LDL goal < 70) -Uncontrolled - LDL 129 (10/2021), previously 185 (05/2021) -ASCVD risk 34.8% (high) -Long history of leg cramps (pre-dating statins); cramps are worse with physical activity -Current treatment: Rosuvastatin 10 mg 2x weekly - Appropriate, Query Effective Coenzyme Q10 100 mg - Appropriate, Effective, Safe, Accessible Fish oil 1000 mg - Appropriate, Effective, Safe, Accessible Aspirin 81 mg daily -Medications previously tried: pravastatin 40 mg, simvastatin, atorvastatin, vytorin -Educated on Cholesterol goals;  - reviewed goal < 70 given comorbid DM and high ASCVD risk > 20% -Reviewed history of statin intolerance, options for HLD including PCSK9-inhibitors; pt is amenable to PCKS9-I however cost will be an issue, he is already paying high copays for etodolac and testosterone, cannot afford a 3rd high copay -Reviewed cost-saving options including [place] (currently closed) or patient assistance; in review of patient's income he should qualify for Repatha program -Recommend to start Repatha 140 mg -inject every 14 days. PA submitted and approved (Key: [id number])  Diabetes (A1c goal <7%) -Controlled - A1c 6.4% (06/2022) -Pt reports GI upset once a week - nausea, diarrhea. Usually occurs first thing in AM and lasts throughout the day. He takes metformin with AM and PM meals, stomach upset does not seem to occur in temporal relation to metformin doses -Current home glucose readings: n/a -Current  medications: Glimepiride 2 mg daily - Appropriate, Effective, Safe, Accessible Metformin 500 mg BID w/ meals - Appropriate, Effective, Safe, Accessible Pioglitazone 30 mg daily -Appropriate, Effective, Safe, Accessible Testing supplies -Medications previously tried: n/a  -Educated on A1c and blood sugar goals; -Recommended to continue current medication  Pain (Goal: manage pain) -Controlled - pt has been on etodolac for years, it recently was changed to a Tier 3 and has been expensive -Hx leg cramps, back pain, foot pain, knee pain. Previously saw ortho for knee injections -Current treatment  Etodolac 500 mg - Appropriate, Effective, Query Safe Pregabalin 75 mg - Appropriate, Effective, Safe, Accessible Alpha lipoic acid 200 mg -Medications previously tried: n/a  -Recommended to continue current medication  GERD (Goal: minimize symptoms of reflux ) -Controlled -Current treatment  Omeprazole 40 mg daily - Appropriate, Effective, Safe, Accessible -Medications previously tried: none reported  -Hx of Bleeds/ulcers: No -Recommended to continue current medication  Patient Goals/Self-Care Activities Patient will:  - take medications as prescribed as evidenced by patient report and record review focus on medication adherence by routine       Medication Assistance:  [place] PAP pending. Expected approval 08/25/22  Compliance/Adherence/Medication fill history: Care Gaps: None  Star-Rating Drugs: Glimepiride - PDC 100% Losartan - PDC 100% Pioglitazone - PDC 98% Rosuvastatin - 0%; LF 03/2020 #60  Medication Access: Within the past 30 days, how often has patient missed a dose of medication? 0 Is a pillbox or other method used  to improve adherence? No  Factors that may affect medication adherence? no barriers identified Are meds synced by current pharmacy? No  Are meds delivered by current pharmacy? No  Does patient experience delays in picking up medications due to  transportation concerns? No   [facility] Services Reviewed: Is patient disadvantaged to use [facility]?: No  Current Rx insurance plan: [facility] Name and location of Current pharmacy:  [facility] [place], [place] - [address] [place] [address] Phone: [phone] Fax: [phone]  [facility] services reviewed with patient today?: No  Patient requests to transfer care to [facility] Pharmacy?: No  Reason patient declined to change pharmacies: Not mentioned at this visit   Care Plan and Follow Up Patient Decision:  Patient agrees to Care Plan and Follow-up.  Plan: Telephone follow up appointment with care management team member scheduled for:  1 month  Charlene Brooke, PharmD, [facility] Clinical Pharmacist [facility] at [facility] [phone]

## 2022-08-11 NOTE — Addendum Note (Signed)
Addended by: Charlton Haws on: 08/11/2022 01:20 PM   Modules accepted: Orders

## 2022-08-11 NOTE — Progress Notes (Signed)
    Chronic Care Management Pharmacy Assistant   Name: Ian Johnson  MRN: [id number] DOB: 1956/07/06  Reason for Encounter: CCM (Repatha PAP 2023)   [place] patient assistance forms with [employer] has been completed and uploaded.  Charlene Brooke, CPP notified  Marijean Niemann, [place] Clinical Pharmacy Assistant [phone]

## 2022-08-11 NOTE — Addendum Note (Signed)
Addended by: Tonia Ghent on: 08/11/2022 04:34 PM   Modules accepted: Orders

## 2022-08-11 NOTE — Telephone Encounter (Addendum)
Discussed lipid control with patient. Reviewed LDL goal < 70 given comorbid DM and high ASCVD risk (34%); recent LDL 129 (10/2021) on rosuvastatin 10 mg twice a week and history of statin intolerance. Reviewed options for lipid control, pt opted for Repatha injections. He should qualify for patient assistance. Submitted insurance PA and it has been approved.  Last lipids Lab Results  Component Value Date   CHOL 204 (H) 10/08/2021   HDL 58.10 10/08/2021   LDLCALC 129 (H) 10/08/2021   LDLDIRECT 142.9 06/21/2011   TRIG 80.0 10/08/2021   CHOLHDL 4 10/08/2021    Recommendations -Start Repatha 140 mg injection every 14 days - will pursue PAP through [employer] -Repeat lipid panel 6-8 week after starting Repatha (he likely won't start until October when PAP is approved/shipped)

## 2022-08-11 NOTE — Telephone Encounter (Signed)
Noted, many thanks.  I updated his med list to stop Crestor once Repatha starts.

## 2022-08-18 NOTE — Patient Instructions (Signed)
Visit Information  Phone number for Pharmacist: [phone]   Goals Addressed   None     Care Plan : [place]  Updates made by Charlton Haws, RPH since 08/18/2022 12:00 AM     Problem: Hypertension, Hyperlipidemia, and Diabetes   Priority: High     Long-Range Goal: Disease mgmt   Start Date: 08/11/2022  Expected End Date: 02/16/2023  This Visit's Progress: On track  Priority: High  Note:   Current Barriers:  Suboptimal therapeutic regimen for HLD  Pharmacist Clinical Goal(s):  Patient will adhere to plan to optimize therapeutic regimen for HLD as evidenced by report of adherence to recommended medication management changes through collaboration with PharmD and provider.   Interventions: 1:1 collaboration with Tonia Ghent, MD regarding development and update of comprehensive plan of care as evidenced by provider attestation and co-signature Inter-disciplinary care team collaboration (see longitudinal plan of care) Comprehensive medication review performed; medication list updated in electronic medical record  Hypertension (BP goal <130/80) -Controlled - BP at goal  -Denies hypotensive/hypertensive symptoms -Current treatment: Losartan 25 mg -1/2 tab daily -Appropriate, Effective, Safe, Accessible -Medications previously tried: n/a  -Educated on BP goals and benefits of medications for prevention of heart attack, stroke and kidney damage; -Recommended to continue current medication  Hyperlipidemia: (LDL goal < 70) -Uncontrolled - LDL 129 (10/2021), previously 185 (05/2021) -ASCVD risk 34.8% (high) -Long history of leg cramps (pre-dating statins); cramps are worse with physical activity -Current treatment: Rosuvastatin 10 mg 2x weekly - Appropriate, Query Effective Coenzyme Q10 100 mg - Appropriate, Effective, Safe, Accessible Fish oil 1000 mg - Appropriate, Effective, Safe, Accessible Aspirin 81 mg daily -Medications previously tried:  pravastatin 40 mg, simvastatin, atorvastatin, vytorin -Educated on Cholesterol goals;  - reviewed goal < 70 given comorbid DM and high ASCVD risk > 20% -Reviewed history of statin intolerance, options for HLD including PCSK9-inhibitors; pt is amenable to PCKS9-I however cost will be an issue, he is already paying high copays for etodolac and testosterone, cannot afford a 3rd high copay -Reviewed cost-saving options including [facility] (currently closed) or patient assistance; in review of patient's income he should qualify for Repatha program -Recommend to start Repatha 140 mg -inject every 14 days. PA submitted and approved (Key: [id number])  Diabetes (A1c goal <7%) -Controlled - A1c 6.4% (06/2022) -Pt reports GI upset once a week - nausea, diarrhea. Usually occurs first thing in AM and lasts throughout the day. He takes metformin with AM and PM meals, stomach upset does not seem to occur in temporal relation to metformin doses -Current home glucose readings: n/a -Current medications: Glimepiride 2 mg daily - Appropriate, Effective, Safe, Accessible Metformin 500 mg BID w/ meals - Appropriate, Effective, Safe, Accessible Pioglitazone 30 mg daily -Appropriate, Effective, Safe, Accessible Testing supplies -Medications previously tried: n/a  -Educated on A1c and blood sugar goals; -Recommended to continue current medication  Pain (Goal: manage pain) -Controlled - pt has been on etodolac for years, it recently was changed to a Tier 3 and has been expensive -Hx leg cramps, back pain, foot pain, knee pain. Previously saw ortho for knee injections -Current treatment  Etodolac 500 mg - Appropriate, Effective, Query Safe Pregabalin 75 mg - Appropriate, Effective, Safe, Accessible Alpha lipoic acid 200 mg -Medications previously tried: n/a  -Recommended to continue current medication  GERD (Goal: minimize symptoms of reflux ) -Controlled -Current treatment  Omeprazole 40 mg daily -  Appropriate, Effective, Safe, Accessible -Medications previously tried: none reported  -Hx  of Bleeds/ulcers: No -Recommended to continue current medication  Patient Goals/Self-Care Activities Patient will:  - take medications as prescribed as evidenced by patient report and record review focus on medication adherence by routine       Patient verbalizes understanding of instructions and care plan provided today and agrees to view in [place]. Active MyChart status and patient understanding of how to access instructions and care plan via MyChart confirmed with patient.    Telephone follow up appointment with pharmacy team member scheduled for: 3 months  Charlene Brooke, PharmD, [facility] Clinical Pharmacist Day Primary Care at [facility] [phone]

## 2022-08-24 ENCOUNTER — Other Ambulatory Visit: Payer: Medicare Other

## 2022-08-27 ENCOUNTER — Ambulatory Visit: Payer: Medicare Other | Admitting: Urology

## 2022-08-30 ENCOUNTER — Other Ambulatory Visit: Payer: Medicare Other

## 2022-09-01 ENCOUNTER — Ambulatory Visit: Payer: Medicare Other | Admitting: Urology

## 2022-09-02 ENCOUNTER — Other Ambulatory Visit: Payer: Medicare Other

## 2022-09-02 DIAGNOSIS — E291 Testicular hypofunction: Secondary | ICD-10-CM

## 2022-09-03 LAB — TESTOSTERONE: Testosterone: 469 ng/dL (ref 264–916)

## 2022-09-03 LAB — PSA: Prostate Specific Ag, Serum: 0.7 ng/mL (ref 0.0–4.0)

## 2022-09-03 LAB — HEMATOCRIT: Hematocrit: 38.5 % (ref 37.5–51.0)

## 2022-09-06 ENCOUNTER — Encounter: Payer: Self-pay | Admitting: Urology

## 2022-09-06 ENCOUNTER — Ambulatory Visit: Payer: Medicare Other | Admitting: Urology

## 2022-09-06 VITALS — BP 124/71 | HR 64 | Ht 71.0 in | Wt 190.0 lb

## 2022-09-06 DIAGNOSIS — E291 Testicular hypofunction: Secondary | ICD-10-CM | POA: Diagnosis not present

## 2022-09-06 NOTE — Progress Notes (Signed)
09/06/2022 2:27 PM   Marton Damita Lack 15-May-1956 [id number]  Referring provider: Tonia Ghent, MD [place],  [place] [address]  Chief Complaint  Patient presents with   Hypogonadism    HPI: 66 y.o. male presents for follow-up of hypogonadism.  Initially seen 03/04/2022 with a 3-4-year history of worsening fatigue and decreased libido Testosterone level low at 168 and 2.5.  LH was within the normal range After discussing treatment options he elected a topical gel Repeat testosterone level on replacement was 120 mg/dL.  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   Complex tear of medial meniscus of left knee as current injury 01/19/2013   Diabetes mellitus without mention of complication    Esophageal reflux    Nonspecific elevation of levels of transaminase or lactic acid dehydrogenase (LDH)    Other and unspecified hyperlipidemia    Peripheral neuropathy    Personal history of urinary calculi    Unspecified essential hypertension    no meds now   Wears glasses     Surgical History: Past Surgical History:  Procedure Laterality Date   CHONDROPLASTY Left 01/19/2013   Procedure: CHONDROPLASTY;  Surgeon: Johnny Bridge, MD;  Location: [facility];  Service: Orthopedics;  Laterality: Left;  ARTHROSCOPY KNEE WITH DEBRIDEMENT/SHAVING (CHONDROPLASTY)   COLONOSCOPY     EYE SURGERY  [id number]   multiple lt eye reconstruction surg -glass eye   KNEE ARTHROSCOPY W/ MENISCAL REPAIR Right    2016   KNEE ARTHROSCOPY WITH MEDIAL MENISECTOMY Left 01/19/2013   Procedure: KNEE ARTHROSCOPY  WITH MEDIAL MENISECTOMY;  Surgeon: Johnny Bridge, MD;  Location: [facility];  Service: Orthopedics;  Laterality: Left;  LEFT KNEE SCOPE WITH DEBRIDEMENT AND partial MEDIAL MENISECTOMY   LUMBAR [place] SURGERY  12/06/1988   l4 and l5   SHOULDER ARTHROSCOPY WITH ROTATOR CUFF REPAIR Left    SHOULDER ARTHROSCOPY WITH ROTATOR CUFF REPAIR AND SUBACROMIAL DECOMPRESSION Right 11/09/2013   Procedure: RIGHT SHOULDER ARTHROSCOPY WITH DEBRIDEMENT OF PARTIAL ROTATOR CUFF TEAR AND SUBACROMIAL DECOMPRESSION ;  Surgeon: Johnny Bridge, MD;  Location: [facility];  Service: Orthopedics;  Laterality: Right;   UPPER GI ENDOSCOPY      Home Medications:  Allergies as of 09/06/2022       Reactions   Codeine    REACTION: Nausea   Crestor [rosuvastatin]    myalgia   Duloxetine Other (See Comments)   Intolerant per record review   Gabapentin Other (See Comments)   Effect for pain control wore off, became ineffective.    Phenergan [promethazine Hcl] Other (See Comments)   Sedation caution at '25mg'$ .  Was able to tolerate 12.'5mg'$ .     Venlafaxine    itching        Medication List        Accurate as of September 06, 2022  2:27 PM. If you have any questions, ask your nurse or doctor.          Alpha Lipoic Acid 200 MG Caps Take 600 mg by mouth daily.   aspirin  EC 81 MG tablet Take 81 mg by mouth daily.   Co Q-10 100 MG Caps Take 200 mg by mouth daily.   cycloSPORINE 0.05 % ophthalmic emulsion Commonly known as: Restasis Place 1 drop into the right eye daily.   etodolac 500 MG tablet Commonly known as: LODINE Take 1 tablet (500 mg total) by mouth 2 (two) times daily with a meal.   fish oil-omega-3 fatty acids 1000 MG capsule Take 2 g by mouth daily. 1200 mg daily.   Ginkgo Biloba 60 MG Caps Take 1 each by mouth daily.   glimepiride 2 MG tablet Commonly known as: AMARYL Take 1 tablet (2 mg total) by mouth daily.   losartan 25 MG tablet Commonly known as:  COZAAR Take 1/2 (one-half) tablet by mouth once daily   metFORMIN 500 MG tablet Commonly known as: GLUCOPHAGE Take 1 tablet (500 mg total) by mouth 2 (two) times daily with a meal.   multivitamin tablet Take 1 tablet by mouth daily.   omeprazole 40 MG capsule Commonly known as: PRILOSEC Take 1 capsule by mouth twice daily   ondansetron 4 MG tablet Commonly known as: Zofran Take 1 tablet (4 mg total) by mouth every 8 (eight) hours as needed for nausea or vomiting.   OneTouch Verio test strip Generic drug: glucose blood USE 1 STRIP TO CHECK GLUCOSE TWICE DAILY AS NEEDED   pioglitazone 30 MG tablet Commonly known as: ACTOS Take 1 tablet (30 mg total) by mouth daily.   pregabalin 75 MG capsule Commonly known as: LYRICA Take 1 capsule by mouth twice daily   Repatha SureClick 169 MG/ML [place] Generic drug: Evolocumab Inject 140 mg into the skin every 14 (fourteen) days. Via [employer] PAP   rosuvastatin 10 MG tablet Commonly known as: CRESTOR Take 1 tablet twice a week.  Stop medication after starting Repatha.   Testosterone 1.62 % Gel APPLY 2 PUMPS TOPICALLY ONCE DAILY TO EACH SHOULDER (4 PUMPS TOTAL)   vardenafil 10 MG tablet Commonly known as: LEVITRA Take 1 tablet (10 mg total) by mouth daily as needed for erectile dysfunction.   Vitamin C 500 MG Caps Take 1 each by mouth daily.        Allergies:  Allergies  Allergen Reactions   Codeine     REACTION: Nausea   Crestor [Rosuvastatin]     myalgia   Duloxetine Other (See Comments)    Intolerant per record review   Gabapentin Other (See Comments)    Effect for pain control wore off, became ineffective.    Phenergan [Promethazine Hcl] Other (See Comments)    Sedation caution at '25mg'$ .  Was able to tolerate 12.'5mg'$ .     Venlafaxine     itching    Family History: Family History  Problem Relation Age of Onset   Coronary artery disease Mother    Coronary artery disease Father    Peripheral vascular disease Father     Prostate cancer Father    Colon cancer Neg Hx     Social History:  reports that he has never smoked. He has never used smokeless tobacco. He reports that he does not drink alcohol and does not use drugs.   Physical Exam: BP 124/71   Pulse 64   Ht '5\' 11"'$  (1.803 m)   Wt 190 lb (86.2 kg)   BMI 26.50 kg/m   Constitutional:  Alert and oriented, No acute distress. HEENT: [place] AT Respiratory: Normal respiratory effort, no increased work of breathing. Psychiatric: Normal mood and affect.  Assessment & Plan:    1.  Hypogonadism Marked symptom improvement energy level and libido on TRT and he desires to continue topical gel Refill sent pharmacy PSA slightly above baseline but still very low Lab visit 6 months testosterone, PSA, hematocrit Office visit 1 year with above labs   Abbie Sons, [place] [address] [place], [place] [address] [phone]

## 2022-09-07 ENCOUNTER — Other Ambulatory Visit: Payer: Self-pay | Admitting: Urology

## 2022-09-07 ENCOUNTER — Telehealth: Payer: Self-pay

## 2022-09-07 NOTE — Progress Notes (Signed)
    Chronic Care Management Pharmacy Assistant   Name: ILYAS LIPSITZ  MRN: [id number] DOB: 1956-06-01  Reason for Encounter: CCM (Repatha PAP 2023 Approved)   Patient has been approved through 12/05/2022 for [place] through [facility]. Patient will need to call [phone] ([employer]) to set up delivery as [employer] will only speak to the patient. [employer] did try to contact patient on 08/30/2022 to set up delivery; a message was left. I called patient to let him know he was approved and to ask him to call [employer]; no answer; left message.   Charlene Brooke, CPP notified  Marijean Niemann, [place] Clinical Pharmacy Assistant [phone]

## 2022-09-09 ENCOUNTER — Other Ambulatory Visit: Payer: Self-pay | Admitting: Family Medicine

## 2022-09-28 ENCOUNTER — Ambulatory Visit (INDEPENDENT_AMBULATORY_CARE_PROVIDER_SITE_OTHER): Payer: Medicare Other

## 2022-09-28 VITALS — Ht 71.0 in | Wt 190.0 lb

## 2022-09-28 DIAGNOSIS — Z0000 Encounter for general adult medical examination without abnormal findings: Secondary | ICD-10-CM

## 2022-09-28 NOTE — Progress Notes (Signed)
Subjective:   Ian Johnson is a 66 y.o. male who presents for Medicare Annual/Subsequent preventive examination.  Review of Systems    Virtual Visit via Telephone Note  I connected with  Ian Johnson on 09/28/22 at  9:45 AM EDT by telephone and verified that I am speaking with the correct person using two identifiers.  Location: Patient: Home Provider: Office Persons participating in the virtual visit: patient/Nurse Health Advisor   I discussed the limitations, risks, security and privacy concerns of performing an evaluation and management service by telephone and the availability of in person appointments. The patient expressed understanding and agreed to proceed.  Interactive audio and video telecommunications were attempted between this nurse and patient, however failed, due to patient having technical difficulties OR patient did not have access to video capability.  We continued and completed visit with audio only.  Some vital signs may be absent or patient reported.   Criselda Peaches, LPN  Cardiac Risk Factors include: advanced age (>56mn, >>23women);diabetes mellitus;hypertension;male gender     Objective:    Today's Vitals   09/28/22 1041  Weight: 190 lb (86.2 kg)  Height: '5\' 11"'$  (1.803 m)   Body mass index is 26.5 kg/m.     09/28/2022   10:49 AM 05/13/2020    9:55 AM 05/04/2019    9:44 AM 06/22/2016    9:44 AM 06/22/2016    9:21 AM 11/06/2013    5:36 PM 01/15/2013    4:16 PM  Advanced Directives  Does Patient Have a Medical Advance Directive? No No No No No Patient does not have advance directive;Patient would not like information Patient does not have advance directive  Would patient like information on creating a medical advance directive? No - Patient declined No - Patient declined No - Patient declined Yes - Educational materials given Yes - Educational materials given      Current Medications (verified) Outpatient Encounter Medications as of  09/28/2022  Medication Sig   Alpha Lipoic Acid 200 MG CAPS Take 600 mg by mouth daily.   Ascorbic Acid (VITAMIN C) 500 MG CAPS Take 1 each by mouth daily.   aspirin 81 MG EC tablet Take 81 mg by mouth daily.   Coenzyme Q10 (CO Q-10) 100 MG CAPS Take 200 mg by mouth daily.   cycloSPORINE (RESTASIS) 0.05 % ophthalmic emulsion Place 1 drop into the right eye daily.   etodolac (LODINE) 500 MG tablet Take 1 tablet (500 mg total) by mouth 2 (two) times daily with a meal.   Evolocumab (REPATHA SURECLICK) 1937MG/ML SOAJ Inject 140 mg into the skin every 14 (fourteen) days. Via [employer] PAP   fish oil-omega-3 fatty acids 1000 MG capsule Take 2 g by mouth daily. 1200 mg daily.   Ginkgo Biloba 60 MG CAPS Take 1 each by mouth daily.   glimepiride (AMARYL) 2 MG tablet Take 1 tablet (2 mg total) by mouth daily.   losartan (COZAAR) 25 MG tablet Take 1/2 (one-half) tablet by mouth once daily   metFORMIN (GLUCOPHAGE) 500 MG tablet Take 1 tablet (500 mg total) by mouth 2 (two) times daily with a meal.   Multiple Vitamin (MULTIVITAMIN) tablet Take 1 tablet by mouth daily.   omeprazole (PRILOSEC) 40 MG capsule Take 1 capsule by mouth twice daily   ondansetron (ZOFRAN) 4 MG tablet Take 1 tablet (4 mg total) by mouth every 8 (eight) hours as needed for nausea or vomiting.   ONETOUCH VERIO test strip USE 1 STRIP  TO CHECK GLUCOSE TWICE DAILY AS NEEDED   pioglitazone (ACTOS) 30 MG tablet Take 1 tablet (30 mg total) by mouth daily.   pregabalin (LYRICA) 75 MG capsule Take 1 capsule by mouth twice daily   rosuvastatin (CRESTOR) 10 MG tablet Take 1 tablet twice a week.  Stop medication after starting Repatha. (Patient not taking: Reported on 09/28/2022)   Testosterone 1.62 % GEL APPLY TWO PUMPS TOPICALLY ONCE DAILY TO EACH SHOULDER (FOUR PUMPS TOTAL)   vardenafil (LEVITRA) 10 MG tablet Take 1 tablet (10 mg total) by mouth daily as needed for erectile dysfunction.   No facility-administered encounter medications on file as  of 09/28/2022.    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   Complex tear of medial meniscus of left knee as current injury 01/19/2013   Diabetes mellitus without mention of complication    Esophageal reflux    Nonspecific elevation of levels of transaminase or lactic acid dehydrogenase (LDH)    Other and unspecified hyperlipidemia    Peripheral neuropathy    Personal history of urinary calculi    Unspecified essential hypertension    no meds now   Wears glasses    Past Surgical History:  Procedure Laterality Date   CHONDROPLASTY Left 01/19/2013   Procedure: CHONDROPLASTY;  Surgeon: Johnny Bridge, MD;  Location: [address];  Service: Orthopedics;  Laterality: Left;  ARTHROSCOPY KNEE WITH DEBRIDEMENT/SHAVING (CHONDROPLASTY)   COLONOSCOPY     EYE SURGERY  [id number]   multiple lt eye reconstruction surg -glass eye   KNEE ARTHROSCOPY W/ MENISCAL REPAIR Right    2016   KNEE ARTHROSCOPY WITH MEDIAL MENISECTOMY Left 01/19/2013   Procedure: KNEE ARTHROSCOPY WITH MEDIAL MENISECTOMY;  Surgeon: Johnny Bridge, MD;  Location: [facility];  Service: Orthopedics;  Laterality: Left;  LEFT KNEE SCOPE WITH DEBRIDEMENT AND partial MEDIAL MENISECTOMY   LUMBAR [place] SURGERY  12/06/1988   l4 and l5   SHOULDER ARTHROSCOPY WITH ROTATOR CUFF REPAIR Left    SHOULDER ARTHROSCOPY WITH ROTATOR CUFF REPAIR AND SUBACROMIAL DECOMPRESSION Right 11/09/2013   Procedure: RIGHT SHOULDER ARTHROSCOPY WITH DEBRIDEMENT OF PARTIAL ROTATOR CUFF TEAR AND SUBACROMIAL DECOMPRESSION ;  Surgeon: Johnny Bridge, MD;  Location: [facility];  Service: Orthopedics;  Laterality: Right;   UPPER GI ENDOSCOPY     Family  History  Problem Relation Age of Onset   Coronary artery disease Mother    Coronary artery disease Father    Peripheral vascular disease Father    Prostate cancer Father    Colon cancer Neg Hx    Social History   Socioeconomic History   Marital status: Married    Spouse name: Not on file   Number of children: 2   Years of education: 12   Highest education level: Not on file  Occupational History   Occupation: Clinical research associate  Tobacco Use   Smoking status: Never   Smokeless tobacco: Never  Vaping Use   Vaping Use: Never used  Substance and Sexual Activity   Alcohol use: No    Alcohol/week: 0.0 standard drinks of alcohol   Drug use: No   Sexual activity: Yes    Partners: Female  Other Topics Concern   Not on file  Social  History Narrative   HSG. married 7 years - divorced; remarried 1993. 1 biological child and 2 step kids.    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    Social Determinants of Health   Financial Resource Strain: Low Risk  (09/28/2022)   Overall Financial Resource Strain (CARDIA)    Difficulty of Paying Living Expenses: Not hard at all  Food Insecurity: No Food Insecurity (09/28/2022)   Hunger Vital Sign    Worried About Running Out of Food in the Last Year: Never true    Ran Out of Food in the Last Year: Never true  Transportation Needs: No Transportation Needs (09/28/2022)   PRAPARE - Hydrologist (Medical): No    Lack of Transportation (Non-Medical): No  Physical Activity: Sufficiently Active (09/28/2022)   Exercise Vital Sign    Days of Exercise per Week: 3 days    Minutes of Exercise per Session: 60 min  Stress: No Stress Concern Present (09/28/2022)   [place]    Feeling of Stress : Not at all  Social Connections: [place] (09/28/2022)   Social Connection and Isolation Panel [NHANES]    Frequency  of Communication with Friends and Family: More than three times a week    Frequency of Social Gatherings with Friends and Family: More than three times a week    Attends Religious Services: More than 4 times per year    Active Member of [employer] or Organizations: Yes    Attends Music therapist: More than 4 times per year    Marital Status: Married    Tobacco Counseling Counseling given: Not Answered   Clinical Intake: Nutrition Risk Assessment:  Has the patient had any N/V/D within the last 2 months?  No  Does the patient have any non-healing wounds?  No  Has the patient had any unintentional weight loss or weight gain?  No   Diabetes:  Is the patient diabetic?  Yes  If diabetic, was a CBG obtained today?  Yes CBG 134 Taken by patient Did the patient bring in their glucometer from home?  No  How often do you monitor your CBG's? Daily.   Financial Strains and Diabetes Management:  Are you having any financial strains with the device, your supplies or your medication? No .  Does the patient want to be seen by Chronic Care Management for management of their diabetes?  No  Would the patient like to be referred to a Nutritionist or for Diabetic Management?  No   Diabetic Exams:  Diabetic Eye Exam: Completed No. Overdue for diabetic eye exam. Pt has been advised about the importance in completing this exam. A referral has been placed today. Message sent to referral coordinator for scheduling purposes. Advised pt to expect a call from office referred to regarding appt.  Diabetic Foot Exam: Completed N. Pt has been advised about the importance in completing this exam. Pt is scheduled for diabetic foot exam on Followed by PCP.   Pre-visit preparation completed: No  Pain : No/denies pain     BMI - recorded: 26.5 Nutritional Status: BMI 25 -29 Overweight Nutritional Risks: None Diabetes: Yes CBG done?: Yes CBG resulted in Enter/ Edit results?: Yes (CBG 134 Taken by  patient) Did pt. bring in CBG monitor from home?: No  How often do you need to have someone help you when you read instructions, pamphlets, or  other written materials from your doctor or pharmacy?: 1 - Never  Diabetic?  Yes  Interpreter Needed?: No  Information entered by :: Rolene Arbour LPN   Activities of Daily Living    09/28/2022   10:48 AM  In your present state of health, do you have any difficulty performing the following activities:  Hearing? 0  Vision? 0  Difficulty concentrating or making decisions? 0  Walking or climbing stairs? 0  Dressing or bathing? 0  Doing errands, shopping? 0  Preparing Food and eating ? N  Using the Toilet? N  In the past six months, have you accidently leaked urine? N  Do you have problems with loss of bowel control? N  Managing your Medications? N  Managing your Finances? N  Housekeeping or managing your Housekeeping? N    Patient Care Team: Tonia Ghent, MD as PCP - General (Family Medicine) Lyla Glassing, MD as Referring Physician (Ophthalmology) Marchia Bond, MD as Consulting Physician (Orthopedic Surgery) Jarome Matin, MD as Consulting Physician (Dermatology) Solon Augusta, MD as Attending Physician (Ophthalmology) Laurence Spates, MD (Inactive) as Consulting Physician (Gastroenterology) Estill Cotta, MD (Ophthalmology) Charlton Haws, [facility] as Pharmacist (Pharmacist)  Indicate any recent Medical Services you may have received from other than [facility] providers in the past year (date may be approximate).     Assessment:   This is a routine wellness examination for Seraphim.  Hearing/Vision screen Hearing Screening - Comments:: Denies hearing difficulties   Vision Screening - Comments:: Wears rx glasses - up to date with routine eye exams with  [place] issues and exercise activities discussed: Current Exercise Habits: Home exercise routine, Time (Minutes): > 60, Frequency (Times/Week): 3,  Weekly Exercise (Minutes/Week): 0, Intensity: Moderate, Exercise limited by: None identified   Goals Addressed               This Visit's Progress     Increase physical activity (pt-stated)         I will continue to exercise for at least 60 min 3 days per week.       Depression Screen    09/28/2022   10:47 AM 10/08/2021   11:42 AM 05/13/2020    9:56 AM 05/04/2019    9:45 AM 07/05/2018    2:19 PM 06/28/2017    8:53 AM 06/22/2016    9:45 AM  PHQ 2/9 Scores  PHQ - 2 Score 0 0 0 0 0 0 0  PHQ- 9 Score   0 0       Fall Risk    09/28/2022   10:49 AM 06/11/2022    8:34 AM 06/05/2021    3:41 PM 05/13/2020    9:56 AM 05/04/2019    9:45 AM  Fall Risk   Falls in the past year? 0 0 0 0 0  Number falls in past yr: 0 0 0 0   Injury with Fall? 0 0 0 0   Risk for fall due to : No Fall Risks No Fall Risks  Medication side effect   Follow up Falls prevention discussed Falls evaluation completed Falls evaluation completed Falls evaluation completed;Falls prevention discussed     FALL RISK PREVENTION PERTAINING TO THE HOME:  Any stairs in or around the home? Yes  If so, are there any without handrails? No  Home free of loose throw rugs in walkways, pet beds, electrical cords, etc? Yes  Adequate lighting in your home to reduce risk of falls? Yes   ASSISTIVE  DEVICES UTILIZED TO PREVENT FALLS:  Life alert? No  Use of a cane, walker or w/c? No  Grab bars in the bathroom? No  Shower chair or bench in shower? Yes  Elevated toilet seat or a handicapped toilet? Yes   TIMED UP AND GO:  Was the test performed? No . Audio Visit  Cognitive Function:    05/13/2020    9:59 AM 05/04/2019    9:54 AM 06/22/2016    9:25 AM  MMSE - Mini Mental State Exam  Orientation to time '5 5 5  '$ Orientation to Place '5 5 5  '$ Registration '3 3 3  '$ Attention/ Calculation 5 0 0  Recall '3 3 3  '$ Language- name 2 objects  0 0  Language- repeat '1 1 1  '$ Language- follow 3 step command  0 3  Language- read & follow  direction  0 0  Write a sentence  0 0  Copy design  0 0  Total score  17 20        09/28/2022   10:50 AM  6CIT Screen  What Year? 0 points  What month? 0 points  What time? 0 points  Count back from 20 0 points  Months in reverse 0 points  Repeat phrase 0 points  Total Score 0 points    Immunizations Immunization History  Administered Date(s) Administered   Fluad Quad(high Dose 65+) 09/25/2019, 09/25/2020   Influenza Split 10/15/2011, 10/09/2012   Influenza Whole 09/28/2002, 12/05/2007, 10/22/2008, 10/15/2010   Influenza,inj,Quad PF,6+ Mos 10/23/2013, 10/21/2014, 10/20/2015, 10/26/2016, 10/20/2017, 10/10/2018   Influenza-Unspecified 09/16/2021   PFIZER(Purple Top)SARS-COV-2 Vaccination 01/11/2020, 02/01/2020, 10/16/2020, 09/16/2021   Pneumococcal Conjugate-13 11/13/2013   Pneumococcal Polysaccharide-23 01/08/2014, 05/15/2020   Td 06/01/2006   Tdap 11/24/2017   Zoster Recombinat (Shingrix) 04/24/2021, 06/24/2021   Zoster, Live 11/26/2014    TDAP status: Up to date  Flu Vaccine status: Up to date  Pneumococcal vaccine status: Up to date  Covid-19 vaccine status: Completed vaccines  Qualifies for Shingles Vaccine? Yes   Zostavax completed Yes   Shingrix Completed?: Yes  Screening Tests Health Maintenance  Topic Date Due   Diabetic kidney evaluation - Urine ACR  06/15/2013   Diabetic kidney evaluation - GFR measurement  05/15/2022   COVID-19 Vaccine (5 - Pfizer risk series) 10/14/2022 (Originally 11/11/2021)   INFLUENZA VACCINE  03/06/2023 (Originally 07/06/2022)   HEMOGLOBIN A1C  12/12/2022   OPHTHALMOLOGY EXAM  12/15/2022   FOOT EXAM  02/10/2023   Medicare Annual Wellness (AWV)  10/29/2023   TETANUS/TDAP  11/25/2027   COLONOSCOPY (Pts 45-51yr Insurance coverage will need to be confirmed)  09/05/2030   Pneumonia Vaccine 66 Years old  Completed   Hepatitis C Screening  Completed   Zoster Vaccines- Shingrix  Completed   HPV VACCINES  Aged Out    Health  Maintenance  Health Maintenance Due  Topic Date Due   Diabetic kidney evaluation - Urine ACR  06/15/2013   Diabetic kidney evaluation - GFR measurement  05/15/2022    Colorectal cancer screening: Type of screening: Colonoscopy. Completed 09/05/20. Repeat every 10 years  Lung Cancer Screening: (Low Dose CT Chest recommended if Age 68-80years, 30 pack-year currently smoking OR have quit w/in 15years.) does not qualify.    Additional Screening:  Hepatitis C Screening: does qualify; Completed 06/17/16  Vision Screening: Recommended annual ophthalmology exams for early detection of glaucoma and other disorders of the eye. Is the patient up to date with their annual eye exam?  Yes  Who is  the provider or what is the name of the office in which the patient attends annual eye exams? [place] If pt is not established with a provider, would they like to be referred to a provider to establish care? No .   Dental Screening: Recommended annual dental exams for proper oral hygiene  Community Resource Referral / Chronic Care Management:  CRR required this visit?  No   CCM required this visit?  No      Plan:     I have personally reviewed and noted the following in the patient's chart:   Medical and social history Use of alcohol, tobacco or illicit drugs  Current medications and supplements including opioid prescriptions. Patient is not currently taking opioid prescriptions. Functional ability and status Nutritional status Physical activity Advanced directives List of other physicians Hospitalizations, surgeries, and ER visits in previous 12 months Vitals Screenings to include cognitive, depression, and falls Referrals and appointments  In addition, I have reviewed and discussed with patient certain preventive protocols, quality metrics, and best practice recommendations. A written personalized care plan for preventive services as well as general preventive health  recommendations were provided to patient.     Criselda Peaches, LPN   25/04/3975   Nurse Notes: Patient due Diabetic kidney evaluation-ACR and GFR measurement

## 2022-09-28 NOTE — Patient Instructions (Addendum)
[name] , Thank you for taking time to come for your Medicare Wellness Visit. I appreciate your ongoing commitment to your health goals. Please review the following plan we discussed and let me know if I can assist you in the future.   These are the goals we discussed:  Goals       Increase physical activity (pt-stated)       I will continue to exercise for at least 60 min 3 days per week.      Patient Stated      05/13/2020, I will continue to exercise 3 days a week for about 45 minutes.         This is a list of the screening recommended for you and due dates:  Health Maintenance  Topic Date Due   Yearly kidney health urinalysis for diabetes  06/15/2013   Yearly kidney function blood test for diabetes  05/15/2022   COVID-19 Vaccine (5 - Pfizer risk series) 10/14/2022*   Flu Shot  03/06/2023*   Hemoglobin A1C  12/12/2022   Eye exam for diabetics  12/15/2022   Complete foot exam   02/10/2023   Medicare Annual Wellness Visit  10/29/2023   Tetanus Vaccine  11/25/2027   Colon Cancer Screening  09/05/2030   Pneumonia Vaccine  Completed   Hepatitis C Screening: USPSTF Recommendation to screen - Ages 18-79 yo.  Completed   Zoster (Shingles) Vaccine  Completed   HPV Vaccine  Aged Out  *Topic was postponed. The date shown is not the original due date.    Advanced directives: Advance directive discussed with you today. Even though you declined this today, please call our office should you change your mind, and we can give you the proper paperwork for you to fill out.   Conditions/risks identified: None  Next appointment: Follow up in one year for your annual wellness visit.    Preventive Care 13 Years and Older, Male  Preventive care refers to lifestyle choices and visits with your health care provider that can promote health and wellness. What does preventive care include? A yearly physical exam. This is also called an annual well check. Dental exams once or twice a  year. Routine eye exams. Ask your health care provider how often you should have your eyes checked. Personal lifestyle choices, including: Daily care of your teeth and gums. Regular physical activity. Eating a healthy diet. Avoiding tobacco and drug use. Limiting alcohol use. Practicing safe sex. Taking low doses of aspirin every day. Taking vitamin and mineral supplements as recommended by your health care provider. What happens during an annual well check? The services and screenings done by your health care provider during your annual well check will depend on your age, overall health, lifestyle risk factors, and family history of disease. Counseling  Your health care provider may ask you questions about your: Alcohol use. Tobacco use. Drug use. Emotional well-being. Home and relationship well-being. Sexual activity. Eating habits. History of falls. Memory and ability to understand (cognition). Work and work Statistician. Screening  You may have the following tests or measurements: Height, weight, and BMI. Blood pressure. Lipid and cholesterol levels. These may be checked every 5 years, or more frequently if you are over 22 years old. Skin check. Lung cancer screening. You may have this screening every year starting at age 48 if you have a 30-pack-year history of smoking and currently smoke or have quit within the past 15 years. Fecal occult blood test (FOBT) of the stool. You  may have this test every year starting at age 67. Flexible sigmoidoscopy or colonoscopy. You may have a sigmoidoscopy every 5 years or a colonoscopy every 10 years starting at age 56. Prostate cancer screening. Recommendations will vary depending on your family history and other risks. Hepatitis C blood test. Hepatitis B blood test. Sexually transmitted disease (STD) testing. Diabetes screening. This is done by checking your blood sugar (glucose) after you have not eaten for a while (fasting). You may  have this done every 1-3 years. Abdominal aortic aneurysm (AAA) screening. You may need this if you are a current or former smoker. Osteoporosis. You may be screened starting at age 44 if you are at high risk. Talk with your health care provider about your test results, treatment options, and if necessary, the need for more tests. Vaccines  Your health care provider may recommend certain vaccines, such as: Influenza vaccine. This is recommended every year. Tetanus, diphtheria, and acellular pertussis (Tdap, Td) vaccine. You may need a Td booster every 10 years. Zoster vaccine. You may need this after age 60. Pneumococcal 13-valent conjugate (PCV13) vaccine. One dose is recommended after age 34. Pneumococcal polysaccharide (PPSV23) vaccine. One dose is recommended after age 34. Talk to your health care provider about which screenings and vaccines you need and how often you need them. This information is not intended to replace advice given to you by your health care provider. Make sure you discuss any questions you have with your health care provider. Document Released: 12/19/2015 Document Revised: 08/11/2016 Document Reviewed: 09/23/2015 Elsevier Interactive Patient Education  2017 [place] Prevention in the Home Falls can cause injuries. They can happen to people of all ages. There are many things you can do to make your home safe and to help prevent falls. What can I do on the outside of my home? Regularly fix the edges of walkways and driveways and fix any cracks. Remove anything that might make you trip as you walk through a door, such as a raised step or threshold. Trim any bushes or trees on the path to your home. Use bright outdoor lighting. Clear any walking paths of anything that might make someone trip, such as rocks or tools. Regularly check to see if handrails are loose or broken. Make sure that both sides of any steps have handrails. Any raised decks and porches  should have guardrails on the edges. Have any leaves, snow, or ice cleared regularly. Use sand or salt on walking paths during winter. Clean up any spills in your garage right away. This includes oil or grease spills. What can I do in the bathroom? Use night lights. Install grab bars by the toilet and in the tub and shower. Do not use towel bars as grab bars. Use non-skid mats or decals in the tub or shower. If you need to sit down in the shower, use a plastic, non-slip stool. Keep the floor dry. Clean up any water that spills on the floor as soon as it happens. Remove soap buildup in the tub or shower regularly. Attach bath mats securely with double-sided non-slip rug tape. Do not have throw rugs and other things on the floor that can make you trip. What can I do in the bedroom? Use night lights. Make sure that you have a light by your bed that is easy to reach. Do not use any sheets or blankets that are too big for your bed. They should not hang down onto the floor. Have a  firm chair that has side arms. You can use this for support while you get dressed. Do not have throw rugs and other things on the floor that can make you trip. What can I do in the kitchen? Clean up any spills right away. Avoid walking on wet floors. Keep items that you use a lot in easy-to-reach places. If you need to reach something above you, use a strong step stool that has a grab bar. Keep electrical cords out of the way. Do not use floor polish or wax that makes floors slippery. If you must use wax, use non-skid floor wax. Do not have throw rugs and other things on the floor that can make you trip. What can I do with my stairs? Do not leave any items on the stairs. Make sure that there are handrails on both sides of the stairs and use them. Fix handrails that are broken or loose. Make sure that handrails are as long as the stairways. Check any carpeting to make sure that it is firmly attached to the stairs.  Fix any carpet that is loose or worn. Avoid having throw rugs at the top or bottom of the stairs. If you do have throw rugs, attach them to the floor with carpet tape. Make sure that you have a light switch at the top of the stairs and the bottom of the stairs. If you do not have them, ask someone to add them for you. What else can I do to help prevent falls? Wear shoes that: Do not have high heels. Have rubber bottoms. Are comfortable and fit you well. Are closed at the toe. Do not wear sandals. If you use a stepladder: Make sure that it is fully opened. Do not climb a closed stepladder. Make sure that both sides of the stepladder are locked into place. Ask someone to hold it for you, if possible. Clearly mark and make sure that you can see: Any grab bars or handrails. First and last steps. Where the edge of each step is. Use tools that help you move around (mobility aids) if they are needed. These include: Canes. Walkers. Scooters. Crutches. Turn on the lights when you go into a dark area. Replace any light bulbs as soon as they burn out. Set up your furniture so you have a clear path. Avoid moving your furniture around. If any of your floors are uneven, fix them. If there are any pets around you, be aware of where they are. Review your medicines with your doctor. Some medicines can make you feel dizzy. This can increase your chance of falling. Ask your doctor what other things that you can do to help prevent falls. This information is not intended to replace advice given to you by your health care provider. Make sure you discuss any questions you have with your health care provider. Document Released: 09/18/2009 Document Revised: 04/29/2016 Document Reviewed: 12/27/2014 Elsevier Interactive Patient Education  2017 [employer].

## 2022-10-03 ENCOUNTER — Other Ambulatory Visit: Payer: Self-pay | Admitting: Family Medicine

## 2022-10-03 DIAGNOSIS — E785 Hyperlipidemia, unspecified: Secondary | ICD-10-CM

## 2022-10-03 DIAGNOSIS — E1149 Type 2 diabetes mellitus with other diabetic neurological complication: Secondary | ICD-10-CM

## 2022-10-03 DIAGNOSIS — G629 Polyneuropathy, unspecified: Secondary | ICD-10-CM

## 2022-10-04 ENCOUNTER — Other Ambulatory Visit (INDEPENDENT_AMBULATORY_CARE_PROVIDER_SITE_OTHER): Payer: Medicare Other

## 2022-10-04 DIAGNOSIS — E785 Hyperlipidemia, unspecified: Secondary | ICD-10-CM | POA: Diagnosis not present

## 2022-10-04 DIAGNOSIS — G629 Polyneuropathy, unspecified: Secondary | ICD-10-CM

## 2022-10-04 DIAGNOSIS — E1149 Type 2 diabetes mellitus with other diabetic neurological complication: Secondary | ICD-10-CM | POA: Diagnosis not present

## 2022-10-04 LAB — CBC WITH DIFFERENTIAL/PLATELET
Basophils Absolute: 0 10*3/uL (ref 0.0–0.1)
Basophils Relative: 0.6 % (ref 0.0–3.0)
Eosinophils Absolute: 0.1 10*3/uL (ref 0.0–0.7)
Eosinophils Relative: 2.3 % (ref 0.0–5.0)
HCT: 40.6 % (ref 39.0–52.0)
Hemoglobin: 13.1 g/dL (ref 13.0–17.0)
Lymphocytes Relative: 43.7 % (ref 12.0–46.0)
Lymphs Abs: 1.9 10*3/uL (ref 0.7–4.0)
MCHC: 32.3 g/dL (ref 30.0–36.0)
MCV: 89 fl (ref 78.0–100.0)
Monocytes Absolute: 0.7 10*3/uL (ref 0.1–1.0)
Monocytes Relative: 15.8 % — ABNORMAL HIGH (ref 3.0–12.0)
Neutro Abs: 1.6 10*3/uL (ref 1.4–7.7)
Neutrophils Relative %: 37.6 % — ABNORMAL LOW (ref 43.0–77.0)
Platelets: 224 10*3/uL (ref 150.0–400.0)
RBC: 4.57 Mil/uL (ref 4.22–5.81)
RDW: 13.8 % (ref 11.5–15.5)
WBC: 4.2 10*3/uL (ref 4.0–10.5)

## 2022-10-04 LAB — COMPREHENSIVE METABOLIC PANEL
ALT: 32 U/L (ref 0–53)
AST: 24 U/L (ref 0–37)
Albumin: 4.3 g/dL (ref 3.5–5.2)
Alkaline Phosphatase: 37 U/L — ABNORMAL LOW (ref 39–117)
BUN: 14 mg/dL (ref 6–23)
CO2: 29 mEq/L (ref 19–32)
Calcium: 9 mg/dL (ref 8.4–10.5)
Chloride: 100 mEq/L (ref 96–112)
Creatinine, Ser: 0.79 mg/dL (ref 0.40–1.50)
GFR: 91.51 mL/min (ref >60.00)
Glucose, Bld: 138 mg/dL — ABNORMAL HIGH (ref 70–99)
Potassium: 4.9 mEq/L (ref 3.5–5.1)
Sodium: 137 mEq/L (ref 135–145)
Total Bilirubin: 0.9 mg/dL (ref 0.2–1.2)
Total Protein: 6.8 g/dL (ref 6.0–8.3)

## 2022-10-04 LAB — TSH: TSH: 1.87 u[IU]/mL (ref 0.35–5.50)

## 2022-10-04 LAB — LIPID PANEL
Cholesterol: 144 mg/dL (ref 0–200)
HDL: 59.9 mg/dL (ref >39.00)
LDL Cholesterol: 67 mg/dL (ref 0–99)
NonHDL: 83.81
Total CHOL/HDL Ratio: 2
Triglycerides: 82 mg/dL (ref 0.0–149.0)
VLDL: 16.4 mg/dL (ref 0.0–40.0)

## 2022-10-04 LAB — HEMOGLOBIN A1C: Hgb A1c MFr Bld: 6.8 % — ABNORMAL HIGH (ref 4.6–6.5)

## 2022-10-04 LAB — VITAMIN B12: Vitamin B-12: 549 pg/mL (ref 211–911)

## 2022-10-05 DIAGNOSIS — L570 Actinic keratosis: Secondary | ICD-10-CM | POA: Diagnosis not present

## 2022-10-05 DIAGNOSIS — Z85828 Personal history of other malignant neoplasm of skin: Secondary | ICD-10-CM | POA: Diagnosis not present

## 2022-10-05 DIAGNOSIS — C44519 Basal cell carcinoma of skin of other part of trunk: Secondary | ICD-10-CM | POA: Diagnosis not present

## 2022-10-05 DIAGNOSIS — L812 Freckles: Secondary | ICD-10-CM | POA: Diagnosis not present

## 2022-10-05 DIAGNOSIS — D1801 Hemangioma of skin and subcutaneous tissue: Secondary | ICD-10-CM | POA: Diagnosis not present

## 2022-10-05 DIAGNOSIS — D485 Neoplasm of uncertain behavior of skin: Secondary | ICD-10-CM | POA: Diagnosis not present

## 2022-10-05 DIAGNOSIS — D225 Melanocytic nevi of trunk: Secondary | ICD-10-CM | POA: Diagnosis not present

## 2022-10-05 DIAGNOSIS — D2271 Melanocytic nevi of right lower limb, including hip: Secondary | ICD-10-CM | POA: Diagnosis not present

## 2022-10-05 DIAGNOSIS — D2272 Melanocytic nevi of left lower limb, including hip: Secondary | ICD-10-CM | POA: Diagnosis not present

## 2022-10-12 ENCOUNTER — Ambulatory Visit (INDEPENDENT_AMBULATORY_CARE_PROVIDER_SITE_OTHER): Payer: Medicare Other | Admitting: Family Medicine

## 2022-10-12 ENCOUNTER — Encounter: Payer: Self-pay | Admitting: Family Medicine

## 2022-10-12 VITALS — BP 120/58 | HR 62 | Temp 97.6°F | Ht 71.0 in | Wt 188.0 lb

## 2022-10-12 DIAGNOSIS — Z23 Encounter for immunization: Secondary | ICD-10-CM | POA: Diagnosis not present

## 2022-10-12 DIAGNOSIS — Z0000 Encounter for general adult medical examination without abnormal findings: Secondary | ICD-10-CM

## 2022-10-12 DIAGNOSIS — E785 Hyperlipidemia, unspecified: Secondary | ICD-10-CM

## 2022-10-12 DIAGNOSIS — I10 Essential (primary) hypertension: Secondary | ICD-10-CM | POA: Diagnosis not present

## 2022-10-12 DIAGNOSIS — G720 Drug-induced myopathy: Secondary | ICD-10-CM

## 2022-10-12 DIAGNOSIS — Z7189 Other specified counseling: Secondary | ICD-10-CM

## 2022-10-12 DIAGNOSIS — E1149 Type 2 diabetes mellitus with other diabetic neurological complication: Secondary | ICD-10-CM

## 2022-10-12 DIAGNOSIS — R110 Nausea: Secondary | ICD-10-CM | POA: Diagnosis not present

## 2022-10-12 MED ORDER — ETODOLAC 500 MG PO TABS: 500.0000 mg | ORAL_TABLET | Freq: Two times a day (BID) | ORAL | 3 refills | Status: DC

## 2022-10-12 MED ORDER — PREGABALIN 75 MG PO CAPS: 75.0000 mg | ORAL_CAPSULE | Freq: Two times a day (BID) | ORAL | 1 refills | Status: DC

## 2022-10-12 MED ORDER — GLIMEPIRIDE 2 MG PO TABS: 2.0000 mg | ORAL_TABLET | Freq: Every day | ORAL | 3 refills | Status: DC

## 2022-10-12 MED ORDER — ONDANSETRON HCL 4 MG PO TABS: 4.0000 mg | ORAL_TABLET | Freq: Three times a day (TID) | ORAL | 5 refills | Status: DC | PRN

## 2022-10-12 MED ORDER — OMEPRAZOLE 40 MG PO CPDR: 40.0000 mg | DELAYED_RELEASE_CAPSULE | Freq: Two times a day (BID) | ORAL | 3 refills | Status: DC

## 2022-10-12 MED ORDER — LOSARTAN POTASSIUM 25 MG PO TABS: ORAL_TABLET | ORAL | 3 refills | Status: DC

## 2022-10-12 MED ORDER — PIOGLITAZONE HCL 30 MG PO TABS: 30.0000 mg | ORAL_TABLET | Freq: Every day | ORAL | 3 refills | Status: DC

## 2022-10-12 MED ORDER — METFORMIN HCL 500 MG PO TABS: 500.0000 mg | ORAL_TABLET | Freq: Two times a day (BID) | ORAL | 3 refills | Status: DC

## 2022-10-12 NOTE — Patient Instructions (Addendum)
Recheck with A1c at a visit in about 4 months.   Update me as needed.  Thanks for your effort.  Take care.  Glad to see you.

## 2022-10-12 NOTE — Progress Notes (Unsigned)
Diabetes:  Using medications without difficulties: yes Hypoglycemic episodes: no Hyperglycemic episodes: no Feet problems: at baseline, dealing with neuropathy with current dose of meds.  He is not at the point of wanting to increase lyrica.   Blood Sugars averaging: usually ~ 120-135 eye exam within last year: yes A1c controlled at 6.8.    Elevated Cholesterol: Using medications without problems: yes Muscle aches:  some aches but clearly better compared to statin use.   Diet compliance: d/w pt.   Exercise: d/w pt.    Hypertension:    Using medication without problems or lightheadedness:  yes Chest pain with exertion:no Edema: minimal L ankle edema noted since starting testosterone.   Short of breath:no  On testosterone per urology.  I will defer.  He agrees.  Prn use of zofran, about 1 time per week.  No ADE on med.    Flu 2023 Shingrix done PNA 2021. Tetanus 2018 covid vaccine 2021 Colonoscopy 2021 Prostate cancer screening- PSA wnl Advance directive- wife designated if patient were incapacitated. He has AAA screening at external facility 2021.  PMH and SH reviewed. Vital signs, Meds and allergies reviewed.  ROS: Per HPI unless specifically indicated in ROS section   GEN: nad, alert and oriented HEENT: ncat NECK: supple w/o LA CV: rrr PULM: ctab, no inc wob ABD: soft, +bs EXT: trace L>R BLE edema SKIN: no acute rash  Diabetic foot exam: Normal inspection No skin breakdown No calluses  Normal DP pulses Normal sensation to light tough but dec to monofilament B Nails normal

## 2022-10-13 DIAGNOSIS — G720 Drug-induced myopathy: Secondary | ICD-10-CM | POA: Insufficient documentation

## 2022-10-13 DIAGNOSIS — R110 Nausea: Secondary | ICD-10-CM | POA: Insufficient documentation

## 2022-10-13 NOTE — Assessment & Plan Note (Signed)
Advance directive- wife designated if patient were incapacitated.  

## 2022-10-13 NOTE — Assessment & Plan Note (Signed)
Continue losartan.  Recheck periodically.

## 2022-10-13 NOTE — Assessment & Plan Note (Signed)
He is not yet at the point of wanting to increase his Lyrica.  He can manage as is.  See above.  A1c controlled.  Continue alpha lipoic acid, aspirin, Repatha, glimepiride, losartan, metformin, Actos, Lyrica.  Recheck periodically.  Continue work on diet and exercise.

## 2022-10-13 NOTE — Assessment & Plan Note (Signed)
Flu 2023 Shingrix done PNA 2021. Tetanus 2018 covid vaccine 2021 Colonoscopy 2021 Prostate cancer screening- PSA wnl Advance directive- wife designated if patient were incapacitated. He has AAA screening at external facility 2021.

## 2022-10-13 NOTE — Assessment & Plan Note (Signed)
Statin intolerant.  Continue Repatha.

## 2022-10-13 NOTE — Assessment & Plan Note (Signed)
Statin intolerant 

## 2022-10-13 NOTE — Assessment & Plan Note (Signed)
Episodic symptoms, continue Zofran as is.  He will update me as needed.

## 2022-10-14 ENCOUNTER — Telehealth: Payer: Self-pay | Admitting: Family Medicine

## 2022-10-14 NOTE — Telephone Encounter (Signed)
Patient came by and dropped off form to be completed for him to get approved for medication repatha. Left form in  Dr Damita Dunnings folder up front. He stated that Mendel Ryder may need to look over it as well,if so that's fine.

## 2022-10-15 NOTE — Telephone Encounter (Signed)
Form placed in Dr. Josefine Class inbox to sign

## 2022-10-18 NOTE — Telephone Encounter (Signed)
Forms done and faxed. Patient notified.

## 2022-10-25 ENCOUNTER — Telehealth: Payer: Self-pay | Admitting: Family Medicine

## 2022-10-25 NOTE — Telephone Encounter (Signed)
Returned call, unable to reach patient. Left VM to call back  Amy - see if you can reach patient and clarify what his questions/concerns are.

## 2022-10-25 NOTE — Telephone Encounter (Signed)
Pt called in requesting a call back regarding RX Evolocumab (REPATHA SURECLICK) 360 MG/ML SOAJ  . Stated he has questing and concern [phone]

## 2022-10-26 NOTE — Telephone Encounter (Signed)
Called patient; no answer; left message.  Lindsey Foltanski, CPP notified  Kymberlie Brazeau, RMA Clinical Pharmacy Assistant [phone]   

## 2022-11-30 NOTE — Progress Notes (Signed)
[facility] Quality Team Note  Name: Ian Johnson Date of Birth: Jul 03, 1956 MRN: [id number] Date: 11/30/2022  [facility] Quality Team has reviewed this patient's chart, please see recommendations below:  [facility] Quality Other; Pt has open quality gap for KED, needs Micro/Creat Urine test to close this. Please address at next visit.

## 2022-12-02 ENCOUNTER — Encounter: Payer: Self-pay | Admitting: Internal Medicine

## 2022-12-02 ENCOUNTER — Ambulatory Visit (INDEPENDENT_AMBULATORY_CARE_PROVIDER_SITE_OTHER): Payer: Medicare Other | Admitting: Internal Medicine

## 2022-12-02 VITALS — BP 128/64 | HR 64 | Temp 97.7°F | Ht 71.0 in | Wt 188.0 lb

## 2022-12-02 DIAGNOSIS — R053 Chronic cough: Secondary | ICD-10-CM | POA: Diagnosis not present

## 2022-12-02 MED ORDER — AMOXICILLIN-POT CLAVULANATE 875-125 MG PO TABS: 1.0000 | ORAL_TABLET | Freq: Two times a day (BID) | ORAL | 0 refills | Status: DC

## 2022-12-02 MED ORDER — BENZONATATE 200 MG PO CAPS: 200.0000 mg | ORAL_CAPSULE | Freq: Three times a day (TID) | ORAL | 0 refills | Status: DC | PRN

## 2022-12-02 NOTE — Progress Notes (Signed)
Subjective:    Patient ID: Ian Johnson, male    DOB: [birth year], 66 y.o.   MRN: [id number]  HPI Here due to a persistent cough  Started with illness before Thanksgiving Felt like "the flu"--real bad for a couple of days Bad head cold for a while--but now in his chest Coughing up lots of green sputum No fever Not really SOB---but in the evening will have worse cough Lots of congestion in the morning--then will clear up May have some post nasal drip No headache or sinus pressure Some sore throat from the cough No ear pain  Has been using mucinex, mucinex D, dayquil, nyquil--some help but won't clear up  Test for COVID was negative--in the beginning (whole family had it and were negative)  Current Outpatient Medications on File Prior to Visit  Medication Sig Dispense Refill   Alpha Lipoic Acid 200 MG CAPS Take 600 mg by mouth daily.     Ascorbic Acid (VITAMIN C) 500 MG CAPS Take 1 each by mouth daily.     aspirin 81 MG EC tablet Take 81 mg by mouth daily.     Coenzyme Q10 (CO Q-10) 100 MG CAPS Take 200 mg by mouth daily.     cycloSPORINE (RESTASIS) 0.05 % ophthalmic emulsion Place 1 drop into the right eye daily. 1.5 mL 0   etodolac (LODINE) 500 MG tablet Take 1 tablet (500 mg total) by mouth 2 (two) times daily with a meal. 180 tablet 3   Evolocumab (REPATHA SURECLICK) 300 MG/ML SOAJ Inject 140 mg into the skin every 14 (fourteen) days. Via [employer] PAP 2 mL 3   fish oil-omega-3 fatty acids 1000 MG capsule Take 2 g by mouth daily. 1200 mg daily.     Ginkgo Biloba 60 MG CAPS Take 1 each by mouth daily.     glimepiride (AMARYL) 2 MG tablet Take 1 tablet (2 mg total) by mouth daily. 90 tablet 3   losartan (COZAAR) 25 MG tablet Take 1/2 (one-half) tablet by mouth once daily 45 tablet 3   metFORMIN (GLUCOPHAGE) 500 MG tablet Take 1 tablet (500 mg total) by mouth 2 (two) times daily with a meal. 180 tablet 3   Multiple Vitamin (MULTIVITAMIN) tablet Take 1 tablet by mouth daily.      omeprazole (PRILOSEC) 40 MG capsule Take 1 capsule (40 mg total) by mouth 2 (two) times daily. 180 capsule 3   ondansetron (ZOFRAN) 4 MG tablet Take 1 tablet (4 mg total) by mouth every 8 (eight) hours as needed for nausea or vomiting. 30 tablet 5   ONETOUCH VERIO test strip USE 1 STRIP TO CHECK GLUCOSE TWICE DAILY AS NEEDED 100 each 2   pioglitazone (ACTOS) 30 MG tablet Take 1 tablet (30 mg total) by mouth daily. 90 tablet 3   pregabalin (LYRICA) 75 MG capsule Take 1 capsule (75 mg total) by mouth 2 (two) times daily. 180 capsule 1   Testosterone 1.62 % GEL APPLY TWO PUMPS TOPICALLY ONCE DAILY TO EACH SHOULDER (FOUR PUMPS TOTAL) 150 g 2   vardenafil (LEVITRA) 10 MG tablet Take 1 tablet (10 mg total) by mouth daily as needed for erectile dysfunction. 18 tablet 3   No current facility-administered medications on file prior to visit.    Allergies  Allergen Reactions   Codeine     REACTION: Nausea   Crestor [Rosuvastatin]     myalgia   Duloxetine Other (See Comments)    Intolerant per record review   Gabapentin Other (See  Comments)    Effect for pain control wore off, became ineffective.    Phenergan [Promethazine Hcl] Other (See Comments)    Sedation caution at '25mg'$ .  Was able to tolerate 12.'5mg'$ .     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   Complex tear of medial meniscus of left knee as current injury 01/19/2013   Diabetes mellitus without mention of complication    Esophageal reflux    Nonspecific elevation of levels of transaminase or lactic acid dehydrogenase (LDH)    Other and unspecified hyperlipidemia    Peripheral neuropathy    Personal history of urinary calculi    Unspecified essential hypertension    no meds now   Wears glasses     Past Surgical History:  Procedure Laterality Date   CHONDROPLASTY Left  01/19/2013   Procedure: CHONDROPLASTY;  Surgeon: Johnny Bridge, MD;  Location: [place];  Service: Orthopedics;  Laterality: Left;  ARTHROSCOPY KNEE WITH DEBRIDEMENT/SHAVING (CHONDROPLASTY)   COLONOSCOPY     EYE SURGERY  [id number]   multiple lt eye reconstruction surg -glass eye   KNEE ARTHROSCOPY W/ MENISCAL REPAIR Right    2016   KNEE ARTHROSCOPY WITH MEDIAL MENISECTOMY Left 01/19/2013   Procedure: KNEE ARTHROSCOPY WITH MEDIAL MENISECTOMY;  Surgeon: Johnny Bridge, MD;  Location: [place];  Service: Orthopedics;  Laterality: Left;  LEFT KNEE SCOPE WITH DEBRIDEMENT AND partial MEDIAL MENISECTOMY   LUMBAR [place] SURGERY  12/06/1988   l4 and l5   SHOULDER ARTHROSCOPY WITH ROTATOR CUFF REPAIR Left    SHOULDER ARTHROSCOPY WITH ROTATOR CUFF REPAIR AND SUBACROMIAL DECOMPRESSION Right 11/09/2013   Procedure: RIGHT SHOULDER ARTHROSCOPY WITH DEBRIDEMENT OF PARTIAL ROTATOR CUFF TEAR AND SUBACROMIAL DECOMPRESSION ;  Surgeon: Johnny Bridge, MD;  Location: [place];  Service: Orthopedics;  Laterality: Right;   UPPER GI ENDOSCOPY      Family History  Problem Relation Age of Onset   Coronary artery disease Mother    Coronary artery disease Father    Peripheral vascular disease Father    Prostate cancer Father    Colon cancer Neg Hx     Social History   Socioeconomic History   Marital status: Married    Spouse name: Not on file   Number of children: 2   Years of education: 12   Highest education level: Not on file  Occupational History   Occupation: Clinical research associate  Tobacco Use   Smoking status: Never   Smokeless tobacco: Never  Vaping Use   Vaping Use: Never used  Substance and Sexual Activity   Alcohol use: No    Alcohol/week: 0.0 standard drinks of alcohol   Drug use: No   Sexual activity: Yes    Partners: Female  Other Topics Concern   Not on file  Social History Narrative   HSG. married 7 years - divorced; remarried 1993. 1  biological child and 2 step kids.    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    Social Determinants of Health   Financial Resource Strain: Low Risk  (09/28/2022)   Overall Financial Resource Strain (CARDIA)    Difficulty of Paying Living Expenses: Not hard at all  Food Insecurity: No Food Insecurity (09/28/2022)  Hunger Vital Sign    Worried About Running Out of Food in the Last Year: Never true    Ran Out of Food in the Last Year: Never true  Transportation Needs: No Transportation Needs (09/28/2022)   PRAPARE - Hydrologist (Medical): No    Lack of Transportation (Non-Medical): No  Physical Activity: Sufficiently Active (09/28/2022)   Exercise Vital Sign    Days of Exercise per Week: 3 days    Minutes of Exercise per Session: 60 min  Stress: No Stress Concern Present (09/28/2022)   [place]    Feeling of Stress : Not at all  Social Connections: [place] (09/28/2022)   Social Connection and Isolation Panel [NHANES]    Frequency of Communication with Friends and Family: More than three times a week    Frequency of Social Gatherings with Friends and Family: More than three times a week    Attends Religious Services: More than 4 times per year    Active Member of [employer] or Organizations: Yes    Attends Music therapist: More than 4 times per year    Marital Status: Married  Human resources officer Violence: Not At Risk (09/28/2022)   Humiliation, Afraid, Rape, and Kick questionnaire    Fear of Current or Ex-Partner: No    Emotionally Abused: No    Physically Abused: No    Sexually Abused: No   Review of Systems No N/V Eating okay    Objective:   Physical Exam Constitutional:      Appearance: Normal appearance.     Comments: Some coarse cough  HENT:     Head:     Comments: No sinus tenderness    Right Ear: Tympanic  membrane and ear canal normal.     Left Ear: Tympanic membrane and ear canal normal.     Nose: Congestion present.     Mouth/Throat:     Comments: Slight pharyngeal injection Pulmonary:     Effort: Pulmonary effort is normal.     Breath sounds: Normal breath sounds. No wheezing or rales.  Musculoskeletal:     Cervical back: Neck supple.  Lymphadenopathy:     Cervical: No cervical adenopathy.  Neurological:     Mental Status: He is alert.            Assessment & Plan:

## 2022-12-02 NOTE — Assessment & Plan Note (Addendum)
Had initial viral infection --along with entire family Now ongoing productive cough Nothing to suggest pneumonia---likely sinus Will treat with augmentin 875 bid for 7 days Benzonate prn for cough Continue tylenol prn  If still coughing next week, will check CXR

## 2022-12-10 ENCOUNTER — Telehealth: Payer: Self-pay | Admitting: Family Medicine

## 2022-12-10 NOTE — Telephone Encounter (Signed)
Patient would like to know if a prescription for diabetic shoes can be written for him? He would like to know would he have to pick that prescription up and take it to where he would like ,or would you send in somewhere?

## 2022-12-11 NOTE — Telephone Encounter (Signed)
This patient should qualify given his history of diabetes and neuropathy.  There is usually a form supplied by the diabetic shoe company that we fill out.  Please see if he can get a copy of that form.  However if a simple letter will suffice, please give him a letter that he has a history of diabetes with neuropathy.  Thanks.

## 2022-12-14 NOTE — Telephone Encounter (Signed)
Unable to reach patient. Left voicemail to return call to our office.   

## 2022-12-16 NOTE — Telephone Encounter (Signed)
Pt called returning Kelli's missed call. Told pt Duncan's response. Pt stated his insurance was in the process of finding a company that'll be covered through his insurance. Pt states whoever his insurance chooses, he'll see if they'll supply the form & get more info from them as well. Pt states he'll call back. Call back # [phone]

## 2022-12-17 NOTE — Telephone Encounter (Signed)
Pt called back with a update about the diabetic shoes. Pt stated the company supplying the shoes ([employer]) stated Damita Dunnings could send a electronic prescription for the shoes along with the pt's insurance info. Pt stated he was told by [employer], everything could be done electronically & the pt could then be fitted for shoes. [employer] number is [phone] & address is [address], [place], [place] [address].

## 2022-12-19 NOTE — Telephone Encounter (Signed)
Please send the order for diabetic shoes.   Dx:  Diabetes mellitus type 2 with neurological manifestations ([place]) E11.49

## 2022-12-20 ENCOUNTER — Other Ambulatory Visit: Payer: Self-pay

## 2022-12-20 DIAGNOSIS — E1149 Type 2 diabetes mellitus with other diabetic neurological complication: Secondary | ICD-10-CM

## 2022-12-20 NOTE — Telephone Encounter (Signed)
DME order, printed, and faxed along with insurance information to [facility].

## 2022-12-21 ENCOUNTER — Other Ambulatory Visit: Payer: Self-pay | Admitting: Urology

## 2022-12-22 ENCOUNTER — Telehealth: Payer: Self-pay | Admitting: Family Medicine

## 2022-12-22 NOTE — Telephone Encounter (Signed)
LVM for pt to rtn my call to schedule AWV with [facility] call back # [phone] 

## 2022-12-24 NOTE — Telephone Encounter (Signed)
Pt called has not received RX Testosterone cream.  Please advise [phone].

## 2022-12-28 DIAGNOSIS — H2511 Age-related nuclear cataract, right eye: Secondary | ICD-10-CM | POA: Diagnosis not present

## 2022-12-28 DIAGNOSIS — E119 Type 2 diabetes mellitus without complications: Secondary | ICD-10-CM | POA: Diagnosis not present

## 2022-12-28 DIAGNOSIS — H16221 Keratoconjunctivitis sicca, not specified as Sjogren's, right eye: Secondary | ICD-10-CM | POA: Diagnosis not present

## 2022-12-28 LAB — HM DIABETES EYE EXAM

## 2022-12-30 ENCOUNTER — Telehealth: Payer: Self-pay | Admitting: Family Medicine

## 2022-12-30 NOTE — Telephone Encounter (Signed)
LVM for pt to rtn my call to schedule AWV with [facility] call back # [phone]. Please schedule if patient calls the office.

## 2022-12-30 NOTE — Telephone Encounter (Signed)
Pt called office returning Ian Johnson's call. Pt states he completed call back in Oct & asked why does he have to complete another call? Call back # [phone]

## 2022-12-30 NOTE — Telephone Encounter (Signed)
09/28/22 Needs to be re-billed. Message sent to [facility] admin

## 2022-12-31 DIAGNOSIS — E1140 Type 2 diabetes mellitus with diabetic neuropathy, unspecified: Secondary | ICD-10-CM | POA: Diagnosis not present

## 2023-01-12 ENCOUNTER — Telehealth: Payer: Self-pay

## 2023-01-12 NOTE — Telephone Encounter (Signed)
Pharmacy Patient Advocate Encounter  Prior Authorization for Repatha '140MG'$ /ML has been approved.    KEY# [id number] Effective dates: 12/06/22 through 12.31.24

## 2023-01-18 ENCOUNTER — Encounter: Payer: Self-pay | Admitting: Family Medicine

## 2023-01-31 MED ORDER — REPATHA SURECLICK 140 MG/ML ~~LOC~~ SOAJ: 140.0000 mg | SUBCUTANEOUS | 3 refills | Status: DC

## 2023-02-09 ENCOUNTER — Other Ambulatory Visit: Payer: Self-pay | Admitting: Family Medicine

## 2023-02-10 ENCOUNTER — Ambulatory Visit (INDEPENDENT_AMBULATORY_CARE_PROVIDER_SITE_OTHER): Payer: Medicare Other | Admitting: Family Medicine

## 2023-02-10 ENCOUNTER — Encounter: Payer: Self-pay | Admitting: Family Medicine

## 2023-02-10 VITALS — BP 122/60 | HR 60 | Temp 97.7°F | Ht 71.0 in | Wt 184.0 lb

## 2023-02-10 DIAGNOSIS — E1149 Type 2 diabetes mellitus with other diabetic neurological complication: Secondary | ICD-10-CM

## 2023-02-10 LAB — MICROALBUMIN / CREATININE URINE RATIO
Creatinine,U: 107.9 mg/dL
Microalb Creat Ratio: 0.6 mg/g (ref 0.0–30.0)
Microalb, Ur: <0.7 mg/dL (ref 0.0–1.9)

## 2023-02-10 LAB — POCT GLYCOSYLATED HEMOGLOBIN (HGB A1C): Hemoglobin A1C: 6.5 % — AB (ref 4.0–5.6)

## 2023-02-10 NOTE — Progress Notes (Signed)
Diabetes:  Using medications without difficulties: yes Hypoglycemic episodes:no Hyperglycemic episodes:no Feet problems: he is dealing with foot pain/neuropathy and lyrica dose.   Blood Sugars averaging:  usually ~120s-130s in the AMs, diet and activity dependent eye exam within last year: yes A1c 6.5.  d/w pt at [place] is better off statin but not resolved.  Magnesium helped some.   Still trying to exercise at baseline.  Discussed not overstretching.   He is taking protein shake after exercise.    Meds, vitals, and allergies reviewed.   ROS: Per HPI unless specifically indicated in ROS section   GEN: nad, alert and oriented HEENT: ncat NECK: supple w/o LA CV: rrr. PULM: ctab, no inc wob ABD: soft, +bs EXT: no edema SKIN: well perfused.

## 2023-02-10 NOTE — Patient Instructions (Signed)
Go to the lab on the way out.   If you have mychart we'll likely use that to update you.    Take care.  Glad to see you. Plan on recheck in about 4 months.  A1c at the visit.

## 2023-02-12 NOTE — Assessment & Plan Note (Signed)
A1c 6.5.  d/w pt at [place] is better off statin but not resolved.  Magnesium helped some.   Still trying to exercise at baseline.  Discussed not overstretching.  See notes on labs.   Continue glimepiride metformin Actos.  Continue Lyrica as is for now.

## 2023-03-08 ENCOUNTER — Other Ambulatory Visit: Payer: Medicare Other

## 2023-03-08 DIAGNOSIS — E291 Testicular hypofunction: Secondary | ICD-10-CM

## 2023-03-09 ENCOUNTER — Encounter: Payer: Self-pay | Admitting: *Deleted

## 2023-03-09 LAB — TESTOSTERONE: Testosterone: 146 ng/dL — ABNORMAL LOW (ref 264–916)

## 2023-03-09 LAB — HEMATOCRIT: Hematocrit: 40.7 % (ref 37.5–51.0)

## 2023-03-09 LAB — PSA: Prostate Specific Ag, Serum: 0.6 ng/mL (ref 0.0–4.0)

## 2023-03-31 ENCOUNTER — Other Ambulatory Visit: Payer: Self-pay | Admitting: Family Medicine

## 2023-04-04 ENCOUNTER — Other Ambulatory Visit: Payer: Self-pay | Admitting: Urology

## 2023-04-05 DIAGNOSIS — D225 Melanocytic nevi of trunk: Secondary | ICD-10-CM | POA: Diagnosis not present

## 2023-04-05 DIAGNOSIS — Z85828 Personal history of other malignant neoplasm of skin: Secondary | ICD-10-CM | POA: Diagnosis not present

## 2023-04-05 DIAGNOSIS — D1801 Hemangioma of skin and subcutaneous tissue: Secondary | ICD-10-CM | POA: Diagnosis not present

## 2023-05-16 ENCOUNTER — Other Ambulatory Visit: Payer: Self-pay | Admitting: Family Medicine

## 2023-05-24 ENCOUNTER — Other Ambulatory Visit: Payer: Self-pay | Admitting: Family Medicine

## 2023-05-24 NOTE — Telephone Encounter (Signed)
Refill request for Pregabalin 75 MG Oral Capsule   LOV - 02/10/23 Next OV - 06/16/23 Last refill - 10/12/22 #180/1

## 2023-06-15 ENCOUNTER — Encounter: Payer: Self-pay | Admitting: Pharmacist

## 2023-06-15 NOTE — Progress Notes (Signed)
[facility]) [facility] Quality Pharmacy Team Statin Quality Measure Assessment  06/15/2023  Ian Johnson 02/01/1956 [id number]  Per review of chart and payor information, patient has a diagnosis of diabetes but is not currently filling a statin prescription.  This places patient into the Statin Use In Patients with Diabetes (SUPD) measure for CMS.    Documentation of prior failure with rosuvastatin due to muscle pain and cramping. Patient is now treated with PCSK9i.   The 10-year ASCVD risk score (Arnett DK, et al., 2019) is: 23.3%   Values used to calculate the score:     Age: 67 years     Sex: Male     Is Non-Hispanic African American: No     Diabetic: Yes     Tobacco smoker: No     Systolic Blood Pressure: 122 mmHg     Is BP treated: Yes     HDL Cholesterol: 59.9 mg/dL     Total Cholesterol: 144 mg/dL 40/98/1191     Component Value Date/Time   CHOL 144 10/04/2022 0819   TRIG 82.0 10/04/2022 0819   HDL 59.90 10/04/2022 0819   CHOLHDL 2 10/04/2022 0819   VLDL 16.4 10/04/2022 0819   LDLCALC 67 10/04/2022 0819   LDLDIRECT 142.9 06/21/2011 0908    Please consider ONE of the following recommendations:  Initiate high intensity statin Atorvastatin 40 mg once daily, #90, 3 refills   Rosuvastatin 20 mg once daily, #90, 3 refills    Initiate moderate intensity          statin with reduced frequency if prior          statin intolerance 1x weekly, #13, 3 refills   2x weekly, #26, 3 refills   3x weekly, #39, 3 refills    Code for past statin intolerance or  other exclusions (required annually)  Provider Requirements: Associate code during an office visit or telehealth encounter  Drug Induced Myopathy G72.0   Myopathy, unspecified G72.9   Myositis, unspecified M60.9   Rhabdomyolysis M62.82   Cirrhosis of liver K74.69   Prediabetes R73.03   PCOS E28.2   Thank you for allowing [facility] pharmacy to be a part of this patient's care.  Reynold Bowen,  PharmD Clinical Pharmacist [place] Direct Dial: [phone]

## 2023-06-16 ENCOUNTER — Ambulatory Visit (INDEPENDENT_AMBULATORY_CARE_PROVIDER_SITE_OTHER): Payer: Medicare Other | Admitting: Family Medicine

## 2023-06-16 ENCOUNTER — Encounter: Payer: Self-pay | Admitting: Family Medicine

## 2023-06-16 VITALS — BP 110/80 | HR 53 | Temp 96.7°F | Ht 71.0 in | Wt 184.0 lb

## 2023-06-16 DIAGNOSIS — E785 Hyperlipidemia, unspecified: Secondary | ICD-10-CM

## 2023-06-16 DIAGNOSIS — Z7984 Long term (current) use of oral hypoglycemic drugs: Secondary | ICD-10-CM | POA: Diagnosis not present

## 2023-06-16 DIAGNOSIS — E1149 Type 2 diabetes mellitus with other diabetic neurological complication: Secondary | ICD-10-CM

## 2023-06-16 DIAGNOSIS — M79609 Pain in unspecified limb: Secondary | ICD-10-CM | POA: Diagnosis not present

## 2023-06-16 DIAGNOSIS — G720 Drug-induced myopathy: Secondary | ICD-10-CM

## 2023-06-16 LAB — POCT GLYCOSYLATED HEMOGLOBIN (HGB A1C): Hemoglobin A1C: 6.3 % — AB (ref 4.0–5.6)

## 2023-06-16 MED ORDER — REPATHA SURECLICK 140 MG/ML ~~LOC~~ SOAJ: SUBCUTANEOUS | Status: DC

## 2023-06-16 NOTE — Patient Instructions (Addendum)
I would stop repatha for 1 month and see if the cramps get better. Either way, let me know.   Recheck with labs ahead of time in about 3-4 months.  Take care.  Glad to see you.  Zetia may be the next option.

## 2023-06-16 NOTE — Progress Notes (Signed)
Diabetes:  Using medications without difficulties:yes Hypoglycemic episodes: cautions d/w pt.  He keeps a snack with him.   Hyperglycemic episodes:no Feet problems: no change, still on lyrica.   Blood Sugars averaging: usually 120-140.   eye exam within last year: yes A1c 6.3. d/w pt at [facility].    He is still putting up with foot pain due to neuropathy and taking Lyrica for that.  He is able to tolerate his current dose and current pain level.  Statin intolerant.  He had to cut back on repatha due to cramping.  Taking it every 3 weeks helped with cramping.  D/w pt about holding it for 1 month to see if his cramping improves.    He was monitoring his sleep with his watch. Was getting about 6 hours at night.  No known snoring. No known OSA.  Waking from cramping.  Discussed trying to address the cramping above and see if that improves his sleep.  PMH and SH reviewed Meds, vitals, and allergies reviewed.   ROS: Per HPI unless specifically indicated in ROS section   GEN: nad, alert and oriented HEENT: ncat NECK: supple w/o LA CV: rrr. PULM: ctab, no inc wob ABD: soft, +bs EXT: no edema SKIN: no acute rash

## 2023-06-16 NOTE — Assessment & Plan Note (Signed)
Statin intolerant 

## 2023-06-19 NOTE — Assessment & Plan Note (Signed)
Statin intolerant.  He had to cut back on repatha due to cramping.  Taking it every 3 weeks helped with cramping.  D/w pt about holding it for 1 month to see if his cramping improves.   Reasonable to consider Zetia if he has less cramping off Repatha.  Will await update from patient.

## 2023-06-19 NOTE — Assessment & Plan Note (Signed)
He is still putting up with foot pain due to neuropathy and taking Lyrica for that.  He is able to tolerate his current dose and current pain level.

## 2023-06-19 NOTE — Assessment & Plan Note (Signed)
Continue glimepiride metformin Actos.  A1c 6.3, controlled.  Recheck periodically.  Hypoglycemia cautions discussed with patient.

## 2023-07-20 ENCOUNTER — Other Ambulatory Visit: Payer: Self-pay | Admitting: Urology

## 2023-08-10 DIAGNOSIS — M25511 Pain in right shoulder: Secondary | ICD-10-CM | POA: Diagnosis not present

## 2023-09-07 DIAGNOSIS — M5412 Radiculopathy, cervical region: Secondary | ICD-10-CM | POA: Diagnosis not present

## 2023-09-08 ENCOUNTER — Other Ambulatory Visit: Payer: Medicare Other

## 2023-09-08 ENCOUNTER — Other Ambulatory Visit: Payer: Self-pay

## 2023-09-08 DIAGNOSIS — E291 Testicular hypofunction: Secondary | ICD-10-CM

## 2023-09-09 ENCOUNTER — Ambulatory Visit (INDEPENDENT_AMBULATORY_CARE_PROVIDER_SITE_OTHER): Payer: Medicare Other | Admitting: Urology

## 2023-09-09 ENCOUNTER — Encounter: Payer: Self-pay | Admitting: Urology

## 2023-09-09 VITALS — BP 144/74 | HR 60 | Ht 70.0 in | Wt 180.0 lb

## 2023-09-09 DIAGNOSIS — E291 Testicular hypofunction: Secondary | ICD-10-CM

## 2023-09-09 DIAGNOSIS — Z125 Encounter for screening for malignant neoplasm of prostate: Secondary | ICD-10-CM

## 2023-09-09 LAB — HEMATOCRIT: Hematocrit: 43.8 % (ref 37.5–51.0)

## 2023-09-09 LAB — TESTOSTERONE: Testosterone: 277 ng/dL (ref 264–916)

## 2023-09-09 LAB — PSA: Prostate Specific Ag, Serum: 0.7 ng/mL (ref 0.0–4.0)

## 2023-09-09 NOTE — Progress Notes (Signed)
I, Ian Johnson, acting as a scribe for Riki Altes, MD., have documented all relevant documentation on the behalf of Riki Altes, MD, as directed by Riki Altes, MD while in the presence of Riki Altes, MD.  09/09/2023 2:07 PM   Ian Johnson 1956-03-06 [id number]  Referring provider: Joaquim Nam, MD [address] [place],  [place] [address]  Chief Complaint  Patient presents with   Hypogonadism   Urologic history  1.  Hypogonadism topical gel  HPI: Ian Johnson is a 67 y.o. male presents for annual follow-up of hypogonism.   Has done well since his last visit and has no complaints. Labs 10/02-2023, testosterone 277, PSA stable 0.7, hematocrit stable 43.8.  He is applying his testosterone gel a total of 4 pumps daily.  Although his level is low normal, he is not having any symptoms and currently satisfied with his present dose.  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   Complex tear of medial meniscus of left knee as current injury 01/19/2013   Diabetes mellitus without mention of complication    Esophageal reflux    Nonspecific elevation of levels of transaminase or lactic acid dehydrogenase (LDH)    Other and unspecified hyperlipidemia    Peripheral neuropathy    Personal history of urinary calculi    Unspecified essential hypertension    no meds now   Wears glasses     Surgical History: Past Surgical History:  Procedure Laterality Date   CHONDROPLASTY Left 01/19/2013   Procedure: CHONDROPLASTY;  Surgeon: Eulas Post, MD;  Location: [facility];  Service: Orthopedics;  Laterality: Left;  ARTHROSCOPY KNEE WITH DEBRIDEMENT/SHAVING (CHONDROPLASTY)    COLONOSCOPY     EYE SURGERY  [id number]   multiple lt eye reconstruction surg -glass eye   KNEE ARTHROSCOPY W/ MENISCAL REPAIR Right    2016   KNEE ARTHROSCOPY WITH MEDIAL MENISECTOMY Left 01/19/2013   Procedure: KNEE ARTHROSCOPY WITH MEDIAL MENISECTOMY;  Surgeon: Eulas Post, MD;  Location: [facility];  Service: Orthopedics;  Laterality: Left;  LEFT KNEE SCOPE WITH DEBRIDEMENT AND partial MEDIAL MENISECTOMY   LUMBAR DISC SURGERY  12/06/1988   l4 and l5   SHOULDER ARTHROSCOPY WITH ROTATOR CUFF REPAIR Left    SHOULDER ARTHROSCOPY WITH ROTATOR CUFF REPAIR AND SUBACROMIAL DECOMPRESSION Right 11/09/2013   Procedure: RIGHT SHOULDER ARTHROSCOPY WITH DEBRIDEMENT OF PARTIAL ROTATOR CUFF TEAR AND SUBACROMIAL DECOMPRESSION ;  Surgeon: Eulas Post, MD;  Location: [facility];  Service: Orthopedics;  Laterality: Right;   UPPER GI ENDOSCOPY      Home Medications:  Allergies as of 09/09/2023       Reactions   Codeine    REACTION: Nausea   Crestor [rosuvastatin]    myalgia   Duloxetine Other (See Comments)   Intolerant per record review   Gabapentin Other (See Comments)   Effect for pain control wore off, became ineffective.    Phenergan [promethazine Hcl] Other (See Comments)   Sedation caution at 25mg .  Was able to tolerate 12.5mg .     Venlafaxine    itching  Medication List        Accurate as of September 09, 2023  2:07 PM. If you have any questions, ask your nurse or doctor.          Alpha Lipoic Acid 200 MG Caps Take 600 mg by mouth daily.   aspirin EC 81 MG tablet Take 81 mg by mouth daily.   Co Q-10 100 MG Caps Take 200 mg by mouth daily.   cycloSPORINE 0.05 % ophthalmic emulsion Commonly known as: Restasis Place 1 drop into the right eye daily.   etodolac 500 MG tablet Commonly known as: LODINE Take 1 tablet (500 mg total) by mouth 2 (two) times daily with a meal.   fish oil-omega-3 fatty acids 1000 MG capsule Take 2 g by  mouth daily. 1200 mg daily.   Ginkgo Biloba 60 MG Caps Take 1 each by mouth daily.   glimepiride 2 MG tablet Commonly known as: AMARYL Take 1 tablet (2 mg total) by mouth daily.   losartan 25 MG tablet Commonly known as: COZAAR Take 1/2 (one-half) tablet by mouth once daily   magnesium oxide 400 (240 Mg) MG tablet Commonly known as: MAG-OX Take 400 mg by mouth daily.   metFORMIN 500 MG tablet Commonly known as: GLUCOPHAGE Take 1 tablet (500 mg total) by mouth 2 (two) times daily with a meal.   multivitamin tablet Take 1 tablet by mouth daily.   omeprazole 40 MG capsule Commonly known as: PRILOSEC Take 1 capsule (40 mg total) by mouth 2 (two) times daily.   ondansetron 4 MG tablet Commonly known as: Zofran Take 1 tablet (4 mg total) by mouth every 8 (eight) hours as needed for nausea or vomiting.   OneTouch Verio test strip Generic drug: glucose blood USE 1 STRIP TO CHECK GLUCOSE TWICE DAILY AS NEEDED   pioglitazone 30 MG tablet Commonly known as: ACTOS Take 1 tablet (30 mg total) by mouth daily.   pregabalin 75 MG capsule Commonly known as: LYRICA Take 1 capsule by mouth twice daily   Repatha SureClick 140 MG/ML Soaj Generic drug: Evolocumab Held as of 06/16/23   Testosterone 1.62 % Gel APPLY 2 PUMPS TOPICALLY ONCE DAILY TO EACH SHOULDER ( 4 PUMPS TOTAL)   vardenafil 10 MG tablet Commonly known as: LEVITRA Take 1 tablet (10 mg total) by mouth daily as needed for erectile dysfunction.   Vitamin C 500 MG Caps Take 1 each by mouth daily.        Allergies:  Allergies  Allergen Reactions   Codeine     REACTION: Nausea   Crestor [Rosuvastatin]     myalgia   Duloxetine Other (See Comments)    Intolerant per record review   Gabapentin Other (See Comments)    Effect for pain control wore off, became ineffective.    Phenergan [Promethazine Hcl] Other (See Comments)    Sedation caution at 25mg .  Was able to tolerate 12.5mg .     Venlafaxine     itching     Family History: Family History  Problem Relation Age of Onset   Coronary artery disease Mother    Coronary artery disease Father    Peripheral vascular disease Father    Prostate cancer Father    Colon cancer Neg Hx     Social History:  reports that he has never smoked. He has never used smokeless tobacco. He reports that he does not drink alcohol and does not use drugs.   Physical Exam: BP (!) 144/74   Pulse  60   Ht 5\' 10"  (1.778 m)   Wt 180 lb (81.6 kg)   BMI 25.83 kg/m   Constitutional:  Alert and oriented, No acute distress. HEENT: [place] AT, moist mucus membranes.  Trachea midline, no masses. Cardiovascular: No clubbing, cyanosis, or edema. Respiratory: Normal respiratory effort, no increased work of breathing. GI: Abdomen is soft, nontender, nondistended, no abdominal masses Skin: No rashes, bruises or suspicious lesions. Neurologic: Grossly intact, no focal deficits, moving all 4 extremities. Psychiatric: Normal mood and affect.   Assessment & Plan:    1. Hypogonadism Low normal testosterone levels, but he is asymptomatic and will continue present dose.  6 month lab visit testosterone, H/H.  1 year follow-up with testosterone, PSA, H/H.  [facility] [address], [address] [place], [place] [address] [phone]

## 2023-09-10 ENCOUNTER — Encounter: Payer: Self-pay | Admitting: Urology

## 2023-09-11 ENCOUNTER — Telehealth: Payer: Self-pay | Admitting: Family Medicine

## 2023-09-11 NOTE — Telephone Encounter (Signed)
Please get update on patient.  Due to his cramping resolved with cessation of Repatha?  Please let me know.  Reasonable to consider Zetia if he has less cramping off Repatha.

## 2023-09-12 NOTE — Telephone Encounter (Signed)
Unable to reach patient. Left voicemail to return call to our office.   

## 2023-09-15 NOTE — Telephone Encounter (Signed)
Spoke to pt, pt states he believes Repatha isn't working due to him still experiencing the same amount of cramps. Pt states Repatha doesn't seem to be working any better than his other cholesterol meds. Pt states since the meds aren't working & since they're hard to get & expensive, he'd rather start back on old cholesterol meds again. Pt thanks Dr. Para March for checking on him. Call back # [phone]

## 2023-09-18 NOTE — Telephone Encounter (Signed)
Did he stop the Repatha?  If so, did the aches eventually get better when he stopped Repatha?  Please let me know about that so we can make some plans.  Thanks.

## 2023-09-19 NOTE — Telephone Encounter (Signed)
I would stop repatha totally- skip at least 2 and likely 3 doses and see if the aches get better.  Then please let us know about his status at that point.  I would do that prior to making any other changes. Thanks.

## 2023-09-19 NOTE — Telephone Encounter (Signed)
Patient did not D/C the Repatha  he is only taking one every 3 weeks. He has not noticed any change in the aches he has been having with the change in medication.

## 2023-09-20 NOTE — Telephone Encounter (Signed)
Per patient states he tried that and couldn't tell a change. He could not tell a change in symptoms form old cholesterol medications. As much as it cost if the symptoms are the same he would like to go back on old one.

## 2023-09-21 MED ORDER — PRAVASTATIN SODIUM 20 MG PO TABS: 20.0000 mg | ORAL_TABLET | Freq: Every day | ORAL | 1 refills | Status: DC

## 2023-09-21 NOTE — Telephone Encounter (Signed)
In that case I would try pravastatin as it may be less likely than some of the statins to cause aches. I would stop repatha and try pravastatin.  Please have him update [place] in about 2-3 weeks, sooner if needed.  Thanks.

## 2023-09-21 NOTE — Addendum Note (Signed)
Addended by: Joaquim Nam on: 09/21/2023 08:53 PM   Modules accepted: Orders

## 2023-09-22 NOTE — Telephone Encounter (Signed)
Spoke with patient and advised

## 2023-09-26 ENCOUNTER — Other Ambulatory Visit: Payer: Self-pay | Admitting: Family Medicine

## 2023-09-27 ENCOUNTER — Other Ambulatory Visit: Payer: Self-pay | Admitting: Family Medicine

## 2023-09-28 NOTE — Telephone Encounter (Signed)
I denied repatha rx.  Med was prev stopped.

## 2023-09-29 ENCOUNTER — Other Ambulatory Visit: Payer: Self-pay | Admitting: Family Medicine

## 2023-10-03 ENCOUNTER — Ambulatory Visit (INDEPENDENT_AMBULATORY_CARE_PROVIDER_SITE_OTHER): Payer: Medicare Other

## 2023-10-03 VITALS — Ht 71.0 in | Wt 180.0 lb

## 2023-10-03 DIAGNOSIS — Z0000 Encounter for general adult medical examination without abnormal findings: Secondary | ICD-10-CM | POA: Diagnosis not present

## 2023-10-03 NOTE — Patient Instructions (Signed)
[name] , Thank you for taking time to come for your Medicare Wellness Visit. I appreciate your ongoing commitment to your health goals. Please review the following plan we discussed and let me know if I can assist you in the future.   Referrals/Orders/Follow-Ups/Clinician Recommendations: none  This is a list of the screening recommended for you and due dates:  Health Maintenance  Topic Date Due   Yearly kidney function blood test for diabetes  10/05/2023   COVID-19 Vaccine (5 - 2023-24 season) 10/19/2023*   Flu Shot  03/05/2024*   Complete foot exam   10/13/2023   Hemoglobin A1C  12/17/2023   Eye exam for diabetics  12/29/2023   Yearly kidney health urinalysis for diabetes  02/10/2024   Medicare Annual Wellness Visit  10/02/2024   DTaP/Tdap/Td vaccine (3 - Td or Tdap) 11/25/2027   Colon Cancer Screening  09/05/2030   Pneumonia Vaccine  Completed   Hepatitis C Screening  Completed   Zoster (Shingles) Vaccine  Completed   HPV Vaccine  Aged Out  *Topic was postponed. The date shown is not the original due date.    Advanced directives: (Declined) Advance directive discussed with you today. Even though you declined this today, please call our office should you change your mind, and we can give you the proper paperwork for you to fill out.  Next Medicare Annual Wellness Visit scheduled for next year: Yes 10/04/24 @ 10:50am telephone

## 2023-10-03 NOTE — Progress Notes (Signed)
Subjective:   Ian Johnson is a 67 y.o. male who presents for Medicare Annual/Subsequent preventive examination.  Visit Complete: Virtual I connected with  Ian Johnson on 10/03/23 by a audio enabled telemedicine application and verified that I am speaking with the correct person using two identifiers.  Patient Location: Home  Provider Location: Office/Clinic  I discussed the limitations of evaluation and management by telemedicine. The patient expressed understanding and agreed to proceed.  Vital Signs: Because this visit was a virtual/telehealth visit, some criteria may be missing or patient reported. Any vitals not documented were not able to be obtained and vitals that have been documented are patient reported.  Patient Medicare AWV questionnaire was completed by the patient on (not done); I have confirmed that all information answered by patient is correct and no changes since this date. Cardiac Risk Factors include: advanced age (>22men, >46 women);diabetes mellitus;dyslipidemia;hypertension;male gender    Objective:    Today's Vitals   10/03/23 1037 10/03/23 1038  Weight: 180 lb (81.6 kg)   Height: 5\' 11"  (1.803 m)   PainSc:  3    Body mass index is 25.1 kg/m.     10/03/2023   10:50 AM 09/28/2022   10:49 AM 05/13/2020    9:55 AM 05/04/2019    9:44 AM 06/22/2016    9:44 AM 06/22/2016    9:21 AM 11/06/2013    5:36 PM  Advanced Directives  Does Patient Have a Medical Advance Directive? No No No No No No Patient does not have advance directive;Patient would not like information  Would patient like information on creating a medical advance directive?  No - Patient declined No - Patient declined No - Patient declined Yes - Educational materials given Yes - Educational materials given     Current Medications (verified) Outpatient Encounter Medications as of 10/03/2023  Medication Sig   Alpha Lipoic Acid 200 MG CAPS Take 600 mg by mouth daily.   Ascorbic Acid (VITAMIN  C) 500 MG CAPS Take 1 each by mouth daily.   aspirin 81 MG EC tablet Take 81 mg by mouth daily.   Coenzyme Q10 (CO Q-10) 100 MG CAPS Take 200 mg by mouth daily.   cycloSPORINE (RESTASIS) 0.05 % ophthalmic emulsion Place 1 drop into the right eye daily.   etodolac (LODINE) 500 MG tablet TAKE 1 TABLET BY MOUTH TWICE DAILY WITH A MEAL   fish oil-omega-3 fatty acids 1000 MG capsule Take 2 g by mouth daily. 1200 mg daily.   Ginkgo Biloba 60 MG CAPS Take 1 each by mouth daily.   glimepiride (AMARYL) 2 MG tablet Take 1 tablet (2 mg total) by mouth daily.   losartan (COZAAR) 25 MG tablet Take 1/2 (one-half) tablet by mouth once daily   magnesium oxide (MAG-OX) 400 (240 Mg) MG tablet Take 400 mg by mouth daily.   metFORMIN (GLUCOPHAGE) 500 MG tablet TAKE 1 TABLET BY MOUTH TWICE DAILY WITH A MEAL   Multiple Vitamin (MULTIVITAMIN) tablet Take 1 tablet by mouth daily.   omeprazole (PRILOSEC) 40 MG capsule Take 1 capsule (40 mg total) by mouth 2 (two) times daily.   ondansetron (ZOFRAN) 4 MG tablet Take 1 tablet (4 mg total) by mouth every 8 (eight) hours as needed for nausea or vomiting.   ONETOUCH VERIO test strip USE 1 STRIP TO CHECK GLUCOSE TWICE DAILY AS NEEDED   pioglitazone (ACTOS) 30 MG tablet Take 1 tablet (30 mg total) by mouth daily.   pravastatin (PRAVACHOL) 20 MG tablet  Take 1 tablet (20 mg total) by mouth daily.   pregabalin (LYRICA) 75 MG capsule Take 1 capsule by mouth twice daily   Testosterone 1.62 % GEL APPLY 2 PUMPS TOPICALLY ONCE DAILY TO EACH SHOULDER ( 4 PUMPS TOTAL)   vardenafil (LEVITRA) 10 MG tablet Take 1 tablet (10 mg total) by mouth daily as needed for erectile dysfunction.   No facility-administered encounter medications on file as of 10/03/2023.    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   Complex tear of medial meniscus of left knee as current injury 01/19/2013   Diabetes mellitus without mention of complication    Esophageal reflux    Nonspecific elevation of levels of transaminase or lactic acid dehydrogenase (LDH)    Other and unspecified hyperlipidemia    Peripheral neuropathy    Personal history of urinary calculi    Unspecified essential hypertension    no meds now   Wears glasses    Past Surgical History:  Procedure Laterality Date   CHONDROPLASTY Left 01/19/2013   Procedure: CHONDROPLASTY;  Surgeon: Eulas Post, MD;  Location: [facility];  Service: Orthopedics;  Laterality: Left;  ARTHROSCOPY KNEE WITH DEBRIDEMENT/SHAVING (CHONDROPLASTY)   COLONOSCOPY     EYE SURGERY  [id number]   multiple lt eye reconstruction surg -glass eye   KNEE ARTHROSCOPY W/ MENISCAL REPAIR Right    2016   KNEE ARTHROSCOPY WITH MEDIAL MENISECTOMY Left 01/19/2013   Procedure: KNEE ARTHROSCOPY WITH MEDIAL MENISECTOMY;  Surgeon: Eulas Post, MD;  Location: [facility];  Service: Orthopedics;  Laterality: Left;  LEFT KNEE SCOPE WITH DEBRIDEMENT AND partial MEDIAL MENISECTOMY   LUMBAR DISC SURGERY  12/06/1988   l4 and l5   SHOULDER ARTHROSCOPY WITH ROTATOR CUFF REPAIR Left    SHOULDER ARTHROSCOPY WITH ROTATOR CUFF REPAIR AND SUBACROMIAL DECOMPRESSION Right 11/09/2013   Procedure: RIGHT SHOULDER ARTHROSCOPY WITH DEBRIDEMENT OF PARTIAL ROTATOR CUFF TEAR AND SUBACROMIAL DECOMPRESSION ;  Surgeon: Eulas Post, MD;  Location: [facility];  Service: Orthopedics;  Laterality: Right;   UPPER GI ENDOSCOPY     Family History  Problem Relation Age of Onset   Coronary artery disease Mother    Coronary artery disease Father    Peripheral vascular disease Father    Prostate cancer Father    Colon cancer Neg Hx    Social History   Socioeconomic History    Marital status: Married    Spouse name: Not on file   Number of children: 2   Years of education: 12   Highest education level: 12th grade  Occupational History   Occupation: Archivist  Tobacco Use   Smoking status: Never   Smokeless tobacco: Never  Vaping Use   Vaping status: Never Used  Substance and Sexual Activity   Alcohol use: No    Alcohol/week: 0.0 standard drinks of alcohol   Drug use: No   Sexual activity: Yes    Partners: Female  Other Topics Concern   Not on file  Social History Narrative   HSG. married 7 years - divorced; remarried 1993. 1 biological child and 2 step kids.    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  Social Determinants of Health   Financial Resource Strain: Low Risk  (10/03/2023)   Overall Financial Resource Strain (CARDIA)    Difficulty of Paying Living Expenses: Not hard at all  Food Insecurity: No Food Insecurity (10/03/2023)   Hunger Vital Sign    Worried About Running Out of Food in the Last Year: Never true    Ran Out of Food in the Last Year: Never true  Transportation Needs: No Transportation Needs (10/03/2023)   PRAPARE - Administrator, Civil Service (Medical): No    Lack of Transportation (Non-Medical): No  Physical Activity: Sufficiently Active (10/03/2023)   Exercise Vital Sign    Days of Exercise per Week: 3 days    Minutes of Exercise per Session: 60 min  Stress: No Stress Concern Present (10/03/2023)   [employer] of Occupational Health - Occupational Stress Questionnaire    Feeling of Stress : Not at all  Social Connections: Socially Integrated (10/03/2023)   Social Connection and Isolation Panel [NHANES]    Frequency of Communication with Friends and Family: More than three times a week    Frequency of Social Gatherings with Friends and Family: Once a week    Attends Religious Services: More than 4 times per year    Active Member of [employer] or Organizations:  Yes    Attends Engineer, structural: More than 4 times per year    Marital Status: Married    Tobacco Counseling Counseling given: Not Answered   Clinical Intake:  Pre-visit preparation completed: No  Pain : 0-10 Pain Score: 3  Pain Type: Neuropathic pain Pain Location: Arm Pain Orientation: Right Pain Descriptors / Indicators: Aching Pain Onset: More than a month ago Pain Frequency: Intermittent Pain Relieving Factors: steroid injections, exercises  Pain Relieving Factors: steroid injections, exercises  BMI - recorded: 25.1 Nutritional Status: BMI 25 -29 Overweight Nutritional Risks: None Diabetes: Yes CBG done?: Yes (BS 130 this am at home) CBG resulted in Enter/ Edit results?: No Did pt. bring in CBG monitor from home?: No  How often do you need to have someone help you when you read instructions, pamphlets, or other written materials from your doctor or pharmacy?: 1 - Never  Interpreter Needed?: No  Comments: lives with wife Information entered by :: B.Raynell Upton,LPN   Activities of Daily Living    10/03/2023   10:51 AM  In your present state of health, do you have any difficulty performing the following activities:  Hearing? 0  Vision? 0  Difficulty concentrating or making decisions? 0  Walking or climbing stairs? 0  Dressing or bathing? 0  Doing errands, shopping? 0  Preparing Food and eating ? N  Using the Toilet? N  In the past six months, have you accidently leaked urine? N  Do you have problems with loss of bowel control? N  Managing your Medications? N  Managing your Finances? N  Housekeeping or managing your Housekeeping? N    Patient Care Team: Joaquim Nam, MD as PCP - General (Family Medicine) Lia Hopping, MD as Referring Physician (Ophthalmology) Teryl Lucy, MD as Consulting Physician (Orthopedic Surgery) Donzetta Starch, MD as Consulting Physician (Dermatology) Etta Quill, MD as Attending Physician  (Ophthalmology) Carman Ching, MD (Inactive) as Consulting Physician (Gastroenterology) Sallee Lange, MD (Ophthalmology) Kathyrn Sheriff, [facility] (Inactive) as Pharmacist (Pharmacist)  Indicate any recent Medical Services you may have received from other than [facility] providers in the past year (date may be approximate).     Assessment:  This is a routine wellness examination for Alioune.  Hearing/Vision screen Hearing Screening - Comments:: Pt says his hearing is good Vision Screening - Comments:: Pt says his vision is good w/glasses [facility]   Goals Addressed               This Visit's Progress     COMPLETED: Increase physical activity (pt-stated)   On track      I will continue to exercise for at least 60 min 3 days per week.      COMPLETED: Patient Stated   On track     05/13/2020, I will continue to exercise 3 days a week for about 45 minutes.       Depression Screen    10/03/2023   10:49 AM 06/16/2023   10:04 AM 02/10/2023   10:03 AM 09/28/2022   10:47 AM 10/08/2021   11:42 AM 05/13/2020    9:56 AM 05/04/2019    9:45 AM  PHQ 2/9 Scores  PHQ - 2 Score 0 0 0 0 0 0 0  PHQ- 9 Score  1 0   0 0    Fall Risk    10/03/2023   10:45 AM 06/16/2023   10:04 AM 02/10/2023   10:02 AM 09/28/2022   10:49 AM 06/11/2022    8:34 AM  Fall Risk   Falls in the past year? 0 0 0 0 0  Number falls in past yr: 0 0 0 0 0  Injury with Fall? 0 0 0 0 0  Risk for fall due to : No Fall Risks No Fall Risks No Fall Risks No Fall Risks No Fall Risks  Follow up Education provided;Falls prevention discussed Falls evaluation completed Falls evaluation completed Falls prevention discussed Falls evaluation completed    MEDICARE RISK AT HOME: Medicare Risk at Home Any stairs in or around the home?: Yes If so, are there any without handrails?: Yes Home free of loose throw rugs in walkways, pet beds, electrical cords, etc?: Yes Adequate lighting in your home to reduce risk of falls?: Yes Life  alert?: No Use of a cane, walker or w/c?: No Grab bars in the bathroom?: Yes Shower chair or bench in shower?: Yes Elevated toilet seat or a handicapped toilet?: Yes  TIMED UP AND GO:  Was the test performed?  No    Cognitive Function:    05/13/2020    9:59 AM 05/04/2019    9:54 AM 06/22/2016    9:25 AM  MMSE - Mini Mental State Exam  Orientation to time 5 5 5   Orientation to Place 5 5 5   Registration 3 3 3   Attention/ Calculation 5 0 0  Recall 3 3 3   Language- name 2 objects  0 0  Language- repeat 1 1 1   Language- follow 3 step command  0 3  Language- read & follow direction  0 0  Write a sentence  0 0  Copy design  0 0  Total score  17 20        10/03/2023   10:53 AM 09/28/2022   10:50 AM  6CIT Screen  What Year? 0 points 0 points  What month? 0 points 0 points  What time? 0 points 0 points  Count back from 20 0 points 0 points  Months in reverse 0 points 0 points  Repeat phrase 0 points 0 points  Total Score 0 points 0 points    Immunizations Immunization History  Administered Date(s) Administered   Fluad Quad(high Dose  65+) 09/25/2019, 09/25/2020, 10/12/2022   Influenza Split 10/15/2011, 10/09/2012   Influenza Whole 09/28/2002, 12/05/2007, 10/22/2008, 10/15/2010   Influenza,inj,Quad PF,6+ Mos 10/23/2013, 10/21/2014, 10/20/2015, 10/26/2016, 10/20/2017, 10/10/2018   Influenza-Unspecified 09/16/2021   PFIZER(Purple Top)SARS-COV-2 Vaccination 01/11/2020, 02/01/2020, 10/16/2020, 09/16/2021   Pneumococcal Conjugate-13 11/13/2013   Pneumococcal Polysaccharide-23 01/08/2014, 05/15/2020   Td 06/01/2006   Tdap 11/24/2017   Zoster Recombinant(Shingrix) 04/24/2021, 06/24/2021   Zoster, Live 11/26/2014    TDAP status: Up to date  Flu Vaccine status: Due, Education has been provided regarding the importance of this vaccine. Advised may receive this vaccine at local pharmacy or Health Dept. Aware to provide a copy of the vaccination record if obtained from local  pharmacy or Health Dept. Verbalized acceptance and understanding.  Pneumococcal vaccine status: Up to date  Covid-19 vaccine status: Completed vaccines  Qualifies for Shingles Vaccine? Yes   Zostavax completed Yes   Shingrix Completed?: Yes  Screening Tests Health Maintenance  Topic Date Due   Diabetic kidney evaluation - eGFR measurement  10/05/2023   COVID-19 Vaccine (5 - 2023-24 season) 10/19/2023 (Originally 08/07/2023)   INFLUENZA VACCINE  03/05/2024 (Originally 07/07/2023)   FOOT EXAM  10/13/2023   HEMOGLOBIN A1C  12/17/2023   OPHTHALMOLOGY EXAM  12/29/2023   Diabetic kidney evaluation - Urine ACR  02/10/2024   Medicare Annual Wellness (AWV)  10/02/2024   DTaP/Tdap/Td (3 - Td or Tdap) 11/25/2027   Colonoscopy  09/05/2030   Pneumonia Vaccine 65+ Years old  Completed   Hepatitis C Screening  Completed   Zoster Vaccines- Shingrix  Completed   HPV VACCINES  Aged Out    Health Maintenance  Health Maintenance Due  Topic Date Due   Diabetic kidney evaluation - eGFR measurement  10/05/2023    Colorectal cancer screening: Type of screening: Colonoscopy. Completed 09/05/20. Repeat every 10 years  Lung Cancer Screening: (Low Dose CT Chest recommended if Age 76-80 years, 20 pack-year currently smoking OR have quit w/in 15years.) does not qualify.   Lung Cancer Screening Referral: no  Additional Screening:  Hepatitis C Screening: does not qualify; Completed 06/17/26  Vision Screening: Recommended annual ophthalmology exams for early detection of glaucoma and other disorders of the eye. Is the patient up to date with their annual eye exam?  Yes  Who is the provider or what is the name of the office in which the patient attends annual eye exams? [facility] If pt is not established with a provider, would they like to be referred to a provider to establish care? No .   Dental Screening: Recommended annual dental exams for proper oral hygiene  Diabetic Foot Exam:  Diabetic Foot Exam: Completed 10/12/22  Community Resource Referral / Chronic Care Management: CRR required this visit?  No   CCM required this visit?  No    Plan:     I have personally reviewed and noted the following in the patient's chart:   Medical and social history Use of alcohol, tobacco or illicit drugs  Current medications and supplements including opioid prescriptions. Patient is not currently taking opioid prescriptions. Functional ability and status Nutritional status Physical activity Advanced directives List of other physicians Hospitalizations, surgeries, and ER visits in previous 12 months Vitals Screenings to include cognitive, depression, and falls Referrals and appointments  In addition, I have reviewed and discussed with patient certain preventive protocols, quality metrics, and best practice recommendations. A written personalized care plan for preventive services as well as general preventive health recommendations were provided to patient.  Sue Lush, LPN   60/45/4098   After Visit Summary: (MyChart) Due to this being a telephonic visit, the after visit summary with patients personalized plan was offered to patient via MyChart   Nurse Notes: The patient states he is doing well and has no concerns or questions at this time.He relays he has an appt next month with provider and wants to discuss cholesterol medication. He relays the Repatha has not been effective and has put him in the donut hole. He wants to return to oral medication

## 2023-10-05 DIAGNOSIS — M5412 Radiculopathy, cervical region: Secondary | ICD-10-CM | POA: Diagnosis not present

## 2023-10-18 ENCOUNTER — Ambulatory Visit (INDEPENDENT_AMBULATORY_CARE_PROVIDER_SITE_OTHER): Payer: Medicare Other | Admitting: Family Medicine

## 2023-10-18 ENCOUNTER — Encounter: Payer: Self-pay | Admitting: Family Medicine

## 2023-10-18 VITALS — BP 128/62 | HR 61 | Temp 97.9°F | Ht 71.0 in | Wt 180.4 lb

## 2023-10-18 DIAGNOSIS — Z23 Encounter for immunization: Secondary | ICD-10-CM | POA: Diagnosis not present

## 2023-10-18 DIAGNOSIS — Z0000 Encounter for general adult medical examination without abnormal findings: Secondary | ICD-10-CM

## 2023-10-18 DIAGNOSIS — E1149 Type 2 diabetes mellitus with other diabetic neurological complication: Secondary | ICD-10-CM

## 2023-10-18 DIAGNOSIS — Z7984 Long term (current) use of oral hypoglycemic drugs: Secondary | ICD-10-CM | POA: Diagnosis not present

## 2023-10-18 DIAGNOSIS — Z7189 Other specified counseling: Secondary | ICD-10-CM

## 2023-10-18 DIAGNOSIS — E785 Hyperlipidemia, unspecified: Secondary | ICD-10-CM | POA: Diagnosis not present

## 2023-10-18 DIAGNOSIS — I10 Essential (primary) hypertension: Secondary | ICD-10-CM

## 2023-10-18 LAB — COMPREHENSIVE METABOLIC PANEL
ALT: 25 U/L (ref 0–53)
AST: 21 U/L (ref 0–37)
Albumin: 4.4 g/dL (ref 3.5–5.2)
Alkaline Phosphatase: 38 U/L — ABNORMAL LOW (ref 39–117)
BUN: 23 mg/dL (ref 6–23)
CO2: 31 meq/L (ref 19–32)
Calcium: 9.6 mg/dL (ref 8.4–10.5)
Chloride: 99 meq/L (ref 96–112)
Creatinine, Ser: 1.07 mg/dL (ref 0.40–1.50)
GFR: 70.96 mL/min (ref >60.00)
Glucose, Bld: 137 mg/dL — ABNORMAL HIGH (ref 70–99)
Potassium: 5.1 meq/L (ref 3.5–5.1)
Sodium: 137 meq/L (ref 135–145)
Total Bilirubin: 0.9 mg/dL (ref 0.2–1.2)
Total Protein: 7.3 g/dL (ref 6.0–8.3)

## 2023-10-18 LAB — CBC WITH DIFFERENTIAL/PLATELET
Basophils Absolute: 0.1 10*3/uL (ref 0.0–0.1)
Basophils Relative: 1.4 % (ref 0.0–3.0)
Eosinophils Absolute: 0.1 10*3/uL (ref 0.0–0.7)
Eosinophils Relative: 2.8 % (ref 0.0–5.0)
HCT: 43.8 % (ref 39.0–52.0)
Hemoglobin: 14.5 g/dL (ref 13.0–17.0)
Lymphocytes Relative: 27.3 % (ref 12.0–46.0)
Lymphs Abs: 1.4 10*3/uL (ref 0.7–4.0)
MCHC: 33.2 g/dL (ref 30.0–36.0)
MCV: 89.4 fL (ref 78.0–100.0)
Monocytes Absolute: 0.8 10*3/uL (ref 0.1–1.0)
Monocytes Relative: 14.9 % — ABNORMAL HIGH (ref 3.0–12.0)
Neutro Abs: 2.8 10*3/uL (ref 1.4–7.7)
Neutrophils Relative %: 53.6 % (ref 43.0–77.0)
Platelets: 218 10*3/uL (ref 150.0–400.0)
RBC: 4.89 Mil/uL (ref 4.22–5.81)
RDW: 17 % — ABNORMAL HIGH (ref 11.5–15.5)
WBC: 5.1 10*3/uL (ref 4.0–10.5)

## 2023-10-18 LAB — LIPID PANEL
Cholesterol: 196 mg/dL (ref 0–200)
HDL: 58.4 mg/dL (ref >39.00)
LDL Cholesterol: 96 mg/dL (ref 0–99)
NonHDL: 137.82
Total CHOL/HDL Ratio: 3
Triglycerides: 209 mg/dL — ABNORMAL HIGH (ref 0.0–149.0)
VLDL: 41.8 mg/dL — ABNORMAL HIGH (ref 0.0–40.0)

## 2023-10-18 LAB — VITAMIN B12: Vitamin B-12: 755 pg/mL (ref 211–911)

## 2023-10-18 LAB — TSH: TSH: 2.07 u[IU]/mL (ref 0.35–5.50)

## 2023-10-18 LAB — HEMOGLOBIN A1C: Hgb A1c MFr Bld: 7.4 % — ABNORMAL HIGH (ref 4.6–6.5)

## 2023-10-18 MED ORDER — OMEPRAZOLE 40 MG PO CPDR: 40.0000 mg | DELAYED_RELEASE_CAPSULE | Freq: Two times a day (BID) | ORAL | 3 refills | Status: DC

## 2023-10-18 MED ORDER — PREGABALIN 75 MG PO CAPS: 75.0000 mg | ORAL_CAPSULE | Freq: Two times a day (BID) | ORAL | 1 refills | Status: DC

## 2023-10-18 MED ORDER — PIOGLITAZONE HCL 30 MG PO TABS: 30.0000 mg | ORAL_TABLET | Freq: Every day | ORAL | 3 refills | Status: DC

## 2023-10-18 MED ORDER — METFORMIN HCL 500 MG PO TABS: 500.0000 mg | ORAL_TABLET | Freq: Two times a day (BID) | ORAL | 3 refills | Status: DC

## 2023-10-18 MED ORDER — GLIMEPIRIDE 2 MG PO TABS: 2.0000 mg | ORAL_TABLET | Freq: Every day | ORAL | 3 refills | Status: DC

## 2023-10-18 MED ORDER — LOSARTAN POTASSIUM 25 MG PO TABS: ORAL_TABLET | ORAL | 3 refills | Status: DC

## 2023-10-18 MED ORDER — ONDANSETRON HCL 4 MG PO TABS: 4.0000 mg | ORAL_TABLET | Freq: Three times a day (TID) | ORAL | 5 refills | Status: AC | PRN

## 2023-10-18 NOTE — Progress Notes (Unsigned)
Diabetes:  Using medications without difficulties:see below re: statin.   Hypoglycemic episodes: no Hyperglycemic episodes: no Feet problems: still with neuropathy at baseline.  Blood Sugars averaging: variable but usually ~130.   eye exam within last year: yes He had mult steroid injections this year.    Needs rx for Dm2 shoes.  Has used [facility].  Discussed.   Not yet on pravastatin, has been off repatha for about the last month.  He wanted to know why we had not checked his labs earlier in the year.  Reviewed recent events.  Checking his labs early would not have change the plan.  The plan at the last office visit was for him to stop Repatha for 1 month and then update [place].  That office visit was in July. We had not gotten update from patient in the meantime so we called him in October.  After multiple messages back-and-forth we planned to try pravastatin to see if he could tolerate that.  Additional history today-cramping is better in the meantime when not exercising.  Returned to exercise but had more cramps in the meantime.  Noted that he felt worse on repatha, ie stopping Repatha did not illuminate the cramps but restarting it seemed to make his cramping worse.  He has not yet on pravastatin.  He has insomnia where he is waking up at night due to cramping in his legs feet and toes.  He had cortisone injection after shoulder injury, had neck films done at outside clinic, ortho.  He had to limit exercise in the meantime.    Flu 2024 Shingrix done PNA 2021. Tetanus 2018 covid vaccine 2021 Colonoscopy 2021 Prostate cancer screening- PSA wnl 2024 Advance directive- wife designated if patient were incapacitated. He has AAA screening at external facility 2021.  PMH and SH reviewed  Meds, vitals, and allergies reviewed.   ROS: Per HPI unless specifically indicated in ROS section   GEN: nad, alert and oriented HEENT: ncat NECK: supple w/o LA CV: rrr. PULM: ctab, no inc  wob ABD: soft, +bs EXT: no edema SKIN: well perfused.   Diabetic foot exam: Normal inspection except for dropped metatarsal heads noted. No skin breakdown No calluses  Normal DP pulses Normal sensation to light touch but dec to monofilament Nails normal  35 minutes were devoted to patient care in this encounter (this includes time spent reviewing the patient's file/history, interviewing and examining the patient, counseling/reviewing plan with patient).

## 2023-10-18 NOTE — Patient Instructions (Addendum)
Go to the lab on the way out.   If you have mychart we'll likely use that to update you.    Take care.  Glad to see you. Try taking pravastatin twice a week and gradually increase if tolerated.  If you can't tolerate it then let me know.  Plan on recheck in about 4 months.  A1c at the visit.

## 2023-10-19 ENCOUNTER — Telehealth: Payer: Self-pay | Admitting: Family Medicine

## 2023-10-19 NOTE — Assessment & Plan Note (Signed)
Currently off statin and off Repatha.  See notes on labs. He is going to try taking pravastatin once or twice a week and gradually increase if tolerated.  He can let me know if he does not tolerate it.

## 2023-10-19 NOTE — Assessment & Plan Note (Signed)
Continue Lyrica for neuropathy.  I want to see about options for muscle cramps at night after I see his labs.  Discussed. Needs rx for Dm2 shoes.  Has used [facility].  We will work on that.  See notes on labs.  Continue glimepiride metformin and pioglitazone in the meantime.  Recheck periodically.  See after visit summary.

## 2023-10-19 NOTE — Telephone Encounter (Signed)
Please request diabetic shoe forms from [employer] [facility] so I can fill this out for the patient.  We have filled these forms out previously and they are scanned under media.  Thanks.

## 2023-10-19 NOTE — Assessment & Plan Note (Signed)
Flu 2024 Shingrix done PNA 2021. Tetanus 2018 covid vaccine 2021 Colonoscopy 2021 Prostate cancer screening- PSA wnl 2024 Advance directive- wife designated if patient were incapacitated. He has AAA screening at external facility 2021.

## 2023-10-19 NOTE — Assessment & Plan Note (Signed)
Advance directive- wife designated if patient were incapacitated.  

## 2023-10-20 MED ORDER — METHOCARBAMOL 500 MG PO TABS: 500.0000 mg | ORAL_TABLET | Freq: Every evening | ORAL | 3 refills | Status: DC | PRN

## 2023-10-20 NOTE — Telephone Encounter (Signed)
Called and requested forms from [employer]. Will place on your desk when received

## 2023-10-20 NOTE — Telephone Encounter (Signed)
Left voicemail for patient to return call to office. 

## 2023-10-20 NOTE — Telephone Encounter (Signed)
One other issue- I sent rx for methocarbamol to pharmacy for patient, for him to try at night to see if that helps with cramps.  Please let us know if it doesn't help or if he can't get it filled.  Thanks.

## 2023-10-21 NOTE — Telephone Encounter (Signed)
Patient returned call, I advised him of message below. No questions at this time and patient will call back with any concerns.

## 2023-11-01 DIAGNOSIS — Z85828 Personal history of other malignant neoplasm of skin: Secondary | ICD-10-CM | POA: Diagnosis not present

## 2023-11-01 DIAGNOSIS — L812 Freckles: Secondary | ICD-10-CM | POA: Diagnosis not present

## 2023-11-01 DIAGNOSIS — L853 Xerosis cutis: Secondary | ICD-10-CM | POA: Diagnosis not present

## 2023-11-01 DIAGNOSIS — D225 Melanocytic nevi of trunk: Secondary | ICD-10-CM | POA: Diagnosis not present

## 2023-11-01 DIAGNOSIS — L821 Other seborrheic keratosis: Secondary | ICD-10-CM | POA: Diagnosis not present

## 2023-11-01 DIAGNOSIS — D1801 Hemangioma of skin and subcutaneous tissue: Secondary | ICD-10-CM | POA: Diagnosis not present

## 2023-11-01 DIAGNOSIS — D2271 Melanocytic nevi of right lower limb, including hip: Secondary | ICD-10-CM | POA: Diagnosis not present

## 2023-11-01 DIAGNOSIS — L820 Inflamed seborrheic keratosis: Secondary | ICD-10-CM | POA: Diagnosis not present

## 2023-11-01 DIAGNOSIS — L570 Actinic keratosis: Secondary | ICD-10-CM | POA: Diagnosis not present

## 2023-11-07 ENCOUNTER — Other Ambulatory Visit: Payer: Self-pay | Admitting: Urology

## 2023-12-02 ENCOUNTER — Other Ambulatory Visit: Payer: Self-pay | Admitting: Family Medicine

## 2023-12-05 NOTE — Telephone Encounter (Signed)
Last given 2 yrs ago. Do not see on current med list.

## 2023-12-06 NOTE — Telephone Encounter (Signed)
 Please have patient check with eye clinic about ongoing use/rx.  Thanks.

## 2023-12-06 NOTE — Telephone Encounter (Signed)
Called and spoke with patient he will discuss with eye doc and request refill through them.

## 2023-12-12 DIAGNOSIS — M5412 Radiculopathy, cervical region: Secondary | ICD-10-CM | POA: Diagnosis not present

## 2023-12-22 DIAGNOSIS — M542 Cervicalgia: Secondary | ICD-10-CM | POA: Diagnosis not present

## 2023-12-26 ENCOUNTER — Telehealth: Payer: Self-pay

## 2023-12-26 ENCOUNTER — Other Ambulatory Visit: Payer: Self-pay | Admitting: Family Medicine

## 2023-12-26 NOTE — Telephone Encounter (Signed)
Copied from CRM [id number]. Topic: General - Other >> Dec 26, 2023  3:43 PM Fredrich Romans wrote: Reason for CRM: Diannia Ruder from [facility] called requesting office note for an order that they received for diabetic shoes.They need the information stating that he is in need of the diabetic shoes ,and the information reflecting the foot deformation that the patient has. Also showing an updated foot measurement if he has had one in the last 6 months Fax#: [phone]

## 2023-12-27 NOTE — Telephone Encounter (Signed)
Most recent OV notes faxed to [facility]

## 2024-01-03 DIAGNOSIS — H2511 Age-related nuclear cataract, right eye: Secondary | ICD-10-CM | POA: Diagnosis not present

## 2024-01-03 DIAGNOSIS — E119 Type 2 diabetes mellitus without complications: Secondary | ICD-10-CM | POA: Diagnosis not present

## 2024-01-03 LAB — HM DIABETES EYE EXAM

## 2024-01-06 DIAGNOSIS — M542 Cervicalgia: Secondary | ICD-10-CM | POA: Diagnosis not present

## 2024-01-10 DIAGNOSIS — M5412 Radiculopathy, cervical region: Secondary | ICD-10-CM | POA: Diagnosis not present

## 2024-01-16 DIAGNOSIS — M50822 Other cervical disc disorders at C5-C6 level: Secondary | ICD-10-CM | POA: Diagnosis not present

## 2024-01-16 DIAGNOSIS — M542 Cervicalgia: Secondary | ICD-10-CM | POA: Diagnosis not present

## 2024-01-24 DIAGNOSIS — E1140 Type 2 diabetes mellitus with diabetic neuropathy, unspecified: Secondary | ICD-10-CM | POA: Diagnosis not present

## 2024-01-24 DIAGNOSIS — M542 Cervicalgia: Secondary | ICD-10-CM | POA: Diagnosis not present

## 2024-01-24 DIAGNOSIS — M50822 Other cervical disc disorders at C5-C6 level: Secondary | ICD-10-CM | POA: Diagnosis not present

## 2024-01-31 DIAGNOSIS — M50822 Other cervical disc disorders at C5-C6 level: Secondary | ICD-10-CM | POA: Diagnosis not present

## 2024-01-31 DIAGNOSIS — M542 Cervicalgia: Secondary | ICD-10-CM | POA: Diagnosis not present

## 2024-02-07 DIAGNOSIS — M542 Cervicalgia: Secondary | ICD-10-CM | POA: Diagnosis not present

## 2024-02-07 DIAGNOSIS — M50822 Other cervical disc disorders at C5-C6 level: Secondary | ICD-10-CM | POA: Diagnosis not present

## 2024-02-11 ENCOUNTER — Other Ambulatory Visit: Payer: Self-pay | Admitting: Family Medicine

## 2024-02-14 DIAGNOSIS — M50822 Other cervical disc disorders at C5-C6 level: Secondary | ICD-10-CM | POA: Diagnosis not present

## 2024-02-14 DIAGNOSIS — M542 Cervicalgia: Secondary | ICD-10-CM | POA: Diagnosis not present

## 2024-02-16 ENCOUNTER — Ambulatory Visit (INDEPENDENT_AMBULATORY_CARE_PROVIDER_SITE_OTHER): Payer: Medicare Other | Admitting: Family Medicine

## 2024-02-16 ENCOUNTER — Encounter: Payer: Self-pay | Admitting: Family Medicine

## 2024-02-16 VITALS — BP 142/84 | HR 59 | Temp 97.9°F | Ht 71.0 in | Wt 180.0 lb

## 2024-02-16 DIAGNOSIS — E1149 Type 2 diabetes mellitus with other diabetic neurological complication: Secondary | ICD-10-CM | POA: Diagnosis not present

## 2024-02-16 DIAGNOSIS — Z7984 Long term (current) use of oral hypoglycemic drugs: Secondary | ICD-10-CM

## 2024-02-16 LAB — MICROALBUMIN / CREATININE URINE RATIO
Creatinine,U: 35.5 mg/dL
Microalb Creat Ratio: UNDETERMINED mg/g (ref 0.0–30.0)
Microalb, Ur: <0.7 mg/dL

## 2024-02-16 LAB — POCT GLYCOSYLATED HEMOGLOBIN (HGB A1C): Hemoglobin A1C: 6.4 % — AB (ref 4.0–5.6)

## 2024-02-16 MED ORDER — PRAVASTATIN SODIUM 20 MG PO TABS
20.0000 mg | ORAL_TABLET | ORAL | Status: DC
Start: 2024-02-17 — End: 2024-03-04

## 2024-02-16 MED ORDER — METHOCARBAMOL 500 MG PO TABS: 500.0000 mg | ORAL_TABLET | Freq: Every evening | ORAL | 3 refills | Status: AC | PRN

## 2024-02-16 NOTE — Patient Instructions (Addendum)
 Try to come in fasting for a few hours for your next visit- we can check your cholesterol then.  But don't fast more than a few hours.   Recheck in about 4 months.  Go to the lab on the way out.   If you have mychart we'll likely use that to update you.    Take care.  Glad to see you.

## 2024-02-16 NOTE — Progress Notes (Signed)
 Diabetes:  Using medications without difficulties:yes Hypoglycemic episodes: not unless prolonged fasting.  He is careful about his schedule and keeps a snack.   Hyperglycemic episodes:no Feet problems: at baseline.  Still on lyrica.   Blood Sugars averaging: usually 120-140.   eye exam within last year: yes Metformin, glimepiride, actos.   On losartan.   A1c 6.4.  d/w pt at OV.   MALB pending. Prev MALB d/w pt.  No need to change his meds at this point.   He is taking statin 3x/week.  He can tolerate that.    He has some improvement with methocarbamol, slept better with use.  No ADE on med.    He has been seeing Delbert Harness in the meantime, has f/u pending.   PMH and SH reviewed  Meds, vitals, and allergies reviewed.   ROS: Per HPI unless specifically indicated in ROS section   GEN: nad, alert and oriented HEENT: ncat NECK: supple w/o LA CV: rrr. PULM: ctab, no inc wob ABD: soft, +bs EXT: no edema SKIN: no acute rash

## 2024-02-19 NOTE — Assessment & Plan Note (Signed)
 Metformin, glimepiride, actos.  Continue as is. On losartan.   A1c 6.4.  d/w pt at OV.   MALB pending. Prev MALB d/w pt.  No need to change his meds at this point.  See notes on labs. He is taking statin 3x/week.  He can tolerate that.   Recheck in about 4 months.  He can try to come in fasting for a few hours for his next visit- we can check his cholesterol then.  Advised not to fast more than a few hours.

## 2024-02-21 DIAGNOSIS — M50822 Other cervical disc disorders at C5-C6 level: Secondary | ICD-10-CM | POA: Diagnosis not present

## 2024-02-21 DIAGNOSIS — M542 Cervicalgia: Secondary | ICD-10-CM | POA: Diagnosis not present

## 2024-02-28 DIAGNOSIS — M50822 Other cervical disc disorders at C5-C6 level: Secondary | ICD-10-CM | POA: Diagnosis not present

## 2024-02-28 DIAGNOSIS — M542 Cervicalgia: Secondary | ICD-10-CM | POA: Diagnosis not present

## 2024-03-02 ENCOUNTER — Encounter: Payer: Self-pay | Admitting: Family Medicine

## 2024-03-04 ENCOUNTER — Other Ambulatory Visit: Payer: Self-pay | Admitting: Family Medicine

## 2024-03-04 DIAGNOSIS — E785 Hyperlipidemia, unspecified: Secondary | ICD-10-CM

## 2024-03-04 MED ORDER — ROSUVASTATIN CALCIUM 10 MG PO TABS: ORAL_TABLET | ORAL | 0 refills | Status: DC

## 2024-03-06 DIAGNOSIS — M50822 Other cervical disc disorders at C5-C6 level: Secondary | ICD-10-CM | POA: Diagnosis not present

## 2024-03-06 DIAGNOSIS — M542 Cervicalgia: Secondary | ICD-10-CM | POA: Diagnosis not present

## 2024-03-09 ENCOUNTER — Other Ambulatory Visit: Payer: Medicare Other

## 2024-03-09 DIAGNOSIS — E291 Testicular hypofunction: Secondary | ICD-10-CM

## 2024-03-10 LAB — HEMOGLOBIN AND HEMATOCRIT, BLOOD
Hematocrit: 44.6 % (ref 37.5–51.0)
Hemoglobin: 14.7 g/dL (ref 13.0–17.7)

## 2024-03-10 LAB — TESTOSTERONE: Testosterone: 585 ng/dL (ref 264–916)

## 2024-03-11 ENCOUNTER — Encounter: Payer: Self-pay | Admitting: Urology

## 2024-03-12 DIAGNOSIS — M5412 Radiculopathy, cervical region: Secondary | ICD-10-CM | POA: Diagnosis not present

## 2024-03-20 ENCOUNTER — Other Ambulatory Visit: Payer: Self-pay | Admitting: Family Medicine

## 2024-03-25 ENCOUNTER — Other Ambulatory Visit: Payer: Self-pay | Admitting: Urology

## 2024-03-30 DIAGNOSIS — M5412 Radiculopathy, cervical region: Secondary | ICD-10-CM | POA: Diagnosis not present

## 2024-04-09 ENCOUNTER — Other Ambulatory Visit: Payer: Self-pay | Admitting: Family Medicine

## 2024-04-17 DIAGNOSIS — M5412 Radiculopathy, cervical region: Secondary | ICD-10-CM | POA: Diagnosis not present

## 2024-05-21 ENCOUNTER — Other Ambulatory Visit: Payer: Self-pay | Admitting: Family Medicine

## 2024-05-21 NOTE — Telephone Encounter (Signed)
 LOV:02/16/24 NOV:06/18/24 LAST REFILL: Pregabalin  75 MG Oral Capsule 10/18/23 180 CAPSULES 1 REFILL

## 2024-05-28 ENCOUNTER — Other Ambulatory Visit: Payer: Self-pay

## 2024-05-28 DIAGNOSIS — E785 Hyperlipidemia, unspecified: Secondary | ICD-10-CM

## 2024-05-28 MED ORDER — ROSUVASTATIN CALCIUM 10 MG PO TABS: ORAL_TABLET | ORAL | 0 refills | Status: DC

## 2024-06-02 ENCOUNTER — Other Ambulatory Visit: Payer: Self-pay | Admitting: Family Medicine

## 2024-06-18 ENCOUNTER — Ambulatory Visit: Payer: Self-pay | Admitting: Family Medicine

## 2024-06-18 ENCOUNTER — Ambulatory Visit (INDEPENDENT_AMBULATORY_CARE_PROVIDER_SITE_OTHER): Admitting: Family Medicine

## 2024-06-18 ENCOUNTER — Encounter: Payer: Self-pay | Admitting: Family Medicine

## 2024-06-18 VITALS — BP 136/62 | HR 53 | Temp 97.6°F | Ht 71.0 in | Wt 178.6 lb

## 2024-06-18 DIAGNOSIS — R109 Unspecified abdominal pain: Secondary | ICD-10-CM | POA: Diagnosis not present

## 2024-06-18 DIAGNOSIS — Z7984 Long term (current) use of oral hypoglycemic drugs: Secondary | ICD-10-CM | POA: Diagnosis not present

## 2024-06-18 DIAGNOSIS — E785 Hyperlipidemia, unspecified: Secondary | ICD-10-CM | POA: Diagnosis not present

## 2024-06-18 DIAGNOSIS — M542 Cervicalgia: Secondary | ICD-10-CM | POA: Diagnosis not present

## 2024-06-18 DIAGNOSIS — E1149 Type 2 diabetes mellitus with other diabetic neurological complication: Secondary | ICD-10-CM | POA: Diagnosis not present

## 2024-06-18 LAB — POCT GLYCOSYLATED HEMOGLOBIN (HGB A1C): Hemoglobin A1C: 6 % — AB (ref 4.0–5.6)

## 2024-06-18 MED ORDER — ROSUVASTATIN CALCIUM 10 MG PO TABS: ORAL_TABLET | ORAL | 3 refills | Status: DC

## 2024-06-18 MED ORDER — ACETAMINOPHEN 500 MG PO TABS: 1000.0000 mg | ORAL_TABLET | Freq: Every day | ORAL | Status: AC

## 2024-06-18 NOTE — Assessment & Plan Note (Signed)
 He is going to f/u with Dr. Ibazebo about his neck pain.  He is trying to exercise as much as tolerated.  He had prev injections, with relief.  Had been taking tylenol  1000mg  at bedtime.  Reasonable to continue, d/w pt.

## 2024-06-18 NOTE — Assessment & Plan Note (Signed)
 Continue lyrica  as is.  Taking crestor  3 times per week.  He has been able to tolerate that.   A1c 6, d/w pt at OV.   Some nausea occ, at baseline.  Not worse than prev, zofran  used prn with relief.  Discussed possibly changing metformin  dose, down to daily dosing, but he wanted to continue as is for now.  We can reconsider in the future.

## 2024-06-18 NOTE — Assessment & Plan Note (Signed)
 I don't feel a hernia.  Likely a benign strain.  Some better in the meantime.  He can update me as needed.

## 2024-06-18 NOTE — Progress Notes (Signed)
 Diabetes:  Using medications without difficulties: some nausea occ, at baseline.  Not worse than prev, zofran  used prn with relief.  Discussed possibly changing metformin  dose, down to daily dosing, but he wanted to continue as is for now.  Hypoglycemic episodes:no Hyperglycemic episodes:no Feet problems: at baseline, still on lyrica .  He is putting up with sx at baseline.   Blood Sugars averaging: 114 this AM, usually 120-130.   eye exam within last year: yes Taking crestor  3 times per week.  He has been able to tolerate that.   A1c 6, d/w pt at OV.   He is going to f/u with Dr. Ibazebo about his neck pain.  He is trying to exercise as much as tolerated.  He had prev injections, with relief.  Had been taking tylenol  1000mg  at bedtime.    He was raising his garage door about 1 week ago, LLQ sore in the meantime. Didn't feel a bulge.  Getting better in the meantime.  He got the garage door opener fixed in the meantime.    Meds, vitals, and allergies reviewed.  ROS: Per HPI unless specifically indicated in ROS section   GEN: nad, alert and oriented HEENT: ncat NECK: supple w/o LA CV: rrr. PULM: ctab, no inc wob ABD: soft, +bs, no mass or hernia felt.   EXT: no edema SKIN: well perfused.

## 2024-06-18 NOTE — Patient Instructions (Addendum)
 Recheck labs prior to a yearly visit in about 3-4 months.   Thanks for your effort.   Take care.  Glad to see you. Update me as needed.

## 2024-07-18 DIAGNOSIS — M5412 Radiculopathy, cervical region: Secondary | ICD-10-CM | POA: Diagnosis not present

## 2024-07-22 ENCOUNTER — Other Ambulatory Visit: Payer: Self-pay | Admitting: Family Medicine

## 2024-08-09 ENCOUNTER — Other Ambulatory Visit: Payer: Self-pay | Admitting: Urology

## 2024-08-14 DIAGNOSIS — M50822 Other cervical disc disorders at C5-C6 level: Secondary | ICD-10-CM | POA: Diagnosis not present

## 2024-08-14 DIAGNOSIS — M5412 Radiculopathy, cervical region: Secondary | ICD-10-CM | POA: Diagnosis not present

## 2024-08-17 ENCOUNTER — Other Ambulatory Visit: Payer: Self-pay | Admitting: Family Medicine

## 2024-08-17 DIAGNOSIS — E785 Hyperlipidemia, unspecified: Secondary | ICD-10-CM

## 2024-09-05 ENCOUNTER — Other Ambulatory Visit

## 2024-09-05 ENCOUNTER — Other Ambulatory Visit (INDEPENDENT_AMBULATORY_CARE_PROVIDER_SITE_OTHER)

## 2024-09-05 ENCOUNTER — Other Ambulatory Visit: Payer: Self-pay

## 2024-09-05 DIAGNOSIS — E291 Testicular hypofunction: Secondary | ICD-10-CM

## 2024-09-05 DIAGNOSIS — Z125 Encounter for screening for malignant neoplasm of prostate: Secondary | ICD-10-CM | POA: Diagnosis not present

## 2024-09-06 LAB — HEMOGLOBIN AND HEMATOCRIT, BLOOD
Hematocrit: 43.8 % (ref 37.5–51.0)
Hemoglobin: 14.2 g/dL (ref 13.0–17.7)

## 2024-09-06 LAB — TESTOSTERONE: Testosterone: 674 ng/dL (ref 264–916)

## 2024-09-06 LAB — PSA: Prostate Specific Ag, Serum: 0.9 ng/mL (ref 0.0–4.0)

## 2024-09-07 ENCOUNTER — Ambulatory Visit (INDEPENDENT_AMBULATORY_CARE_PROVIDER_SITE_OTHER): Payer: Self-pay | Admitting: Urology

## 2024-09-07 VITALS — BP 147/69 | HR 53 | Ht 71.0 in | Wt 180.0 lb

## 2024-09-07 DIAGNOSIS — E291 Testicular hypofunction: Secondary | ICD-10-CM

## 2024-09-07 DIAGNOSIS — Z125 Encounter for screening for malignant neoplasm of prostate: Secondary | ICD-10-CM

## 2024-09-07 NOTE — Progress Notes (Signed)
 09/07/2024 11:33 AM   Valon ONEIDA Amber [birth year] [id number]  Referring provider: Cleatus Arlyss RAMAN, MD [address] [place],  [place] [address]  Chief Complaint  Patient presents with   Follow-up   Urologic history  1.  Hypogonadism topical gel  HPI: Ian Johnson is a 68 y.o. male who presents for annual follow-up.  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   Complex tear of medial meniscus of left knee as current injury 01/19/2013   Diabetes mellitus without mention of complication    Esophageal reflux    Nonspecific elevation of levels of transaminase or lactic acid dehydrogenase (LDH)    Other and unspecified hyperlipidemia    Peripheral neuropathy    Personal history of urinary calculi    Unspecified essential hypertension    no meds now   Wears glasses     Surgical History: Past Surgical History:  Procedure Laterality Date   CHONDROPLASTY Left 01/19/2013   Procedure: CHONDROPLASTY;  Surgeon: Fonda SHAUNNA Olmsted, MD;  Location: [facility];  Service: Orthopedics;  Laterality: Left;  ARTHROSCOPY KNEE WITH DEBRIDEMENT/SHAVING (CHONDROPLASTY)   COLONOSCOPY     EYE SURGERY  [id number]   multiple lt eye reconstruction surg -glass eye   KNEE ARTHROSCOPY W/ MENISCAL REPAIR Right    2016   KNEE ARTHROSCOPY WITH MEDIAL MENISECTOMY Left 01/19/2013   Procedure: KNEE ARTHROSCOPY WITH MEDIAL MENISECTOMY;  Surgeon: Fonda SHAUNNA Olmsted, MD;  Location: [facility];  Service: Orthopedics;  Laterality: Left;  LEFT KNEE SCOPE WITH DEBRIDEMENT AND partial MEDIAL  MENISECTOMY   LUMBAR DISC SURGERY  12/06/1988   l4 and l5   SHOULDER ARTHROSCOPY WITH ROTATOR CUFF REPAIR Left    SHOULDER ARTHROSCOPY WITH ROTATOR CUFF REPAIR AND SUBACROMIAL DECOMPRESSION Right 11/09/2013   Procedure: RIGHT SHOULDER ARTHROSCOPY WITH DEBRIDEMENT OF PARTIAL ROTATOR CUFF TEAR AND SUBACROMIAL DECOMPRESSION ;  Surgeon: Fonda SHAUNNA Olmsted, MD;  Location: [facility];  Service: Orthopedics;  Laterality: Right;   UPPER GI ENDOSCOPY      Home Medications:  Allergies as of 09/07/2024       Reactions   Codeine    REACTION: Nausea   Crestor  [rosuvastatin ]    Myalgia with daily dosing but able to tolerate intermittent dosing.    Duloxetine  Other (See Comments)   Intolerant per record review   Gabapentin  Other (See Comments)   Effect for pain control wore off, became ineffective.    Phenergan  [promethazine  Hcl] Other (See Comments)   Sedation caution at 25mg .  Was able to tolerate 12.5mg .     Repatha  [evolocumab ]    Increased aches on med.     Venlafaxine     itching        Medication List        Accurate as of September 07, 2024 11:33 AM. If you have any questions, ask your nurse or doctor.          acetaminophen  500 MG tablet Commonly known as: TYLENOL  Take 2 tablets (1,000 mg total) by mouth  at bedtime.   Alpha Lipoic Acid 200 MG Caps Take 600 mg by mouth daily.   aspirin EC 81 MG tablet Take 81 mg by mouth daily.   Co Q-10 100 MG Caps Take 200 mg by mouth daily.   cycloSPORINE  0.05 % ophthalmic emulsion Commonly known as: Restasis  Place 1 drop into the right eye daily.   etodolac  500 MG tablet Commonly known as: LODINE  TAKE 1 TABLET BY MOUTH TWICE DAILY WITH A MEAL   fish oil-omega-3 fatty acids 1000 MG capsule Take 2 g by mouth daily. 1200 mg daily.   Ginkgo Biloba 60 MG Caps Take 1 each by mouth daily.   glimepiride  2 MG tablet Commonly known as: AMARYL  Take 1 tablet (2 mg total) by mouth daily.   losartan  25 MG tablet Commonly  known as: COZAAR  Take 1/2 (one-half) tablet by mouth once daily   magnesium oxide 400 (240 Mg) MG tablet Commonly known as: MAG-OX Take 400 mg by mouth daily.   metFORMIN  500 MG tablet Commonly known as: GLUCOPHAGE  Take 1 tablet (500 mg total) by mouth 2 (two) times daily with a meal.   methocarbamol  500 MG tablet Commonly known as: ROBAXIN  Take 1 tablet (500 mg total) by mouth at bedtime as needed for muscle spasms.   multivitamin tablet Take 1 tablet by mouth daily.   omeprazole  40 MG capsule Commonly known as: PRILOSEC Take 1 capsule (40 mg total) by mouth 2 (two) times daily.   ondansetron  4 MG tablet Commonly known as: Zofran  Take 1 tablet (4 mg total) by mouth every 8 (eight) hours as needed for nausea or vomiting.   OneTouch Verio test strip Generic drug: glucose blood USE 1 STRIP TO CHECK GLUCOSE TWICE DAILY AS NEEDED   pioglitazone  30 MG tablet Commonly known as: ACTOS  Take 1 tablet (30 mg total) by mouth daily.   pregabalin  75 MG capsule Commonly known as: LYRICA  Take 1 capsule by mouth twice daily   rosuvastatin  10 MG tablet Commonly known as: CRESTOR  TAKE ONE TABLET BY MOUTH TWO TO THREE TIMES PER WEEK   Testosterone  1.62 % Gel APPLY TWO PUMPS ONCE DAILY TO EACH SHOULDER (FOUR PUMPS TOTAL)   vardenafil  10 MG tablet Commonly known as: LEVITRA  Take 1 tablet (10 mg total) by mouth daily as needed for erectile dysfunction.   Vitamin C 500 MG Caps Take 1 each by mouth daily.        Allergies:  Allergies  Allergen Reactions   Codeine     REACTION: Nausea   Crestor  [Rosuvastatin ]     Myalgia with daily dosing but able to tolerate intermittent dosing.    Duloxetine  Other (See Comments)    Intolerant per record review   Gabapentin  Other (See Comments)    Effect for pain control wore off, became ineffective.    Phenergan  [Promethazine  Hcl] Other (See Comments)    Sedation caution at 25mg .  Was able to tolerate 12.5mg .     Repatha  [Evolocumab ]      Increased aches on med.     Venlafaxine      itching    Family History: Family History  Problem Relation Age of Onset   Coronary artery disease Mother    Coronary artery disease Father    Peripheral vascular disease Father    Prostate cancer Father    Colon cancer Neg Hx     Social History:  reports that he has never smoked. He has never used smokeless tobacco. He reports that he does not drink  alcohol and does not use drugs.   Physical Exam: BP (!) 147/69   Pulse (!) 53   Ht 5' 11 (1.803 m)   Wt 180 lb (81.6 kg)   BMI 25.10 kg/m   Constitutional:  Alert, No acute distress. HEENT: [place] AT Respiratory: Normal respiratory effort, no increased work of breathing. GU: Declined DRE Psychiatric: Normal mood and affect.   Assessment & Plan:    1.  Hypogonadism Doing well on TRT Lab visit 6 months testosterone , H/H Office visit 1 year testosterone , H/H, PSA   Glendia JAYSON Barba, MD  [facility] [address], [address] [place], [place] [address] [phone]

## 2024-09-11 ENCOUNTER — Encounter: Payer: Self-pay | Admitting: Urology

## 2024-09-13 ENCOUNTER — Other Ambulatory Visit: Payer: Self-pay | Admitting: Urology

## 2024-09-19 NOTE — Progress Notes (Signed)
 Ian Johnson                                          MRN: [id number]   09/19/2024   The [facility] Quality Team Specialist reviewed this patient medical record for the purposes of chart review for care gap closure. The following were reviewed: abstraction for care gap closure-glycemic status assessment.    [facility] Quality Team

## 2024-09-19 NOTE — Progress Notes (Signed)
 IMMANUEL FEDAK                                          MRN: [id number]   09/19/2024   The [facility] Quality Team Specialist reviewed this patient medical record for the purposes of chart review for care gap closure. The following were reviewed: chart review for care gap closure-kidney health evaluation for diabetes:eGFR  and uACR.    [facility] Quality Team

## 2024-09-22 ENCOUNTER — Other Ambulatory Visit: Payer: Self-pay | Admitting: Family Medicine

## 2024-10-04 ENCOUNTER — Ambulatory Visit (INDEPENDENT_AMBULATORY_CARE_PROVIDER_SITE_OTHER)

## 2024-10-04 VITALS — BP 147/69 | Ht 71.0 in | Wt 180.0 lb

## 2024-10-04 DIAGNOSIS — Z0000 Encounter for general adult medical examination without abnormal findings: Secondary | ICD-10-CM | POA: Diagnosis not present

## 2024-10-04 NOTE — Progress Notes (Signed)
 Because this visit was a virtual/telehealth visit,  certain criteria was not obtained, such a blood pressure, CBG if applicable, and timed get up and go. Any medications not marked as taking were not mentioned during the medication reconciliation part of the visit. Any vitals not documented were not able to be obtained due to this being a telehealth visit or patient was unable to self-report a recent blood pressure reading due to a lack of equipment at home via telehealth. Vitals that have been documented are verbally provided by the patient.   This visit was performed by a medical professional under my direct supervision. I was immediately available for consultation/collaboration. I have reviewed and agree with the Annual Wellness Visit documentation.  Subjective:   Ian Johnson is a 68 y.o. who presents for a Medicare Wellness preventive visit.  As a reminder, Annual Wellness Visits don't include a physical exam, and some assessments may be limited, especially if this visit is performed virtually. We may recommend an in-person follow-up visit with your provider if needed.  Visit Complete: Virtual I connected with  Ian Johnson on 10/04/24 by a audio enabled telemedicine application and verified that I am speaking with the correct person using two identifiers.  Patient Location: Home  Provider Location: Home Office  I discussed the limitations of evaluation and management by telemedicine. The patient expressed understanding and agreed to proceed.  Vital Signs: Because this visit was a virtual/telehealth visit, some criteria may be missing or patient reported. Any vitals not documented were not able to be obtained and vitals that have been documented are patient reported.  VideoDeclined- This patient declined Librarian, academic. Therefore the visit was completed with audio only.  Persons Participating in Visit: Patient.  AWV Questionnaire: Yes: Patient  Medicare AWV questionnaire was completed by the patient on 10/01/2024; I have confirmed that all information answered by patient is correct and no changes since this date.  Cardiac Risk Factors include: male gender;advanced age (>56men, >69 women);diabetes mellitus;hypertension     Objective:    Today's Vitals   10/04/24 1146  BP: (!) 147/69  Weight: 180 lb (81.6 kg)  Height: 5' 11 (1.803 m)   Body mass index is 25.1 kg/m.     10/04/2024   11:49 AM 10/03/2023   10:50 AM 09/28/2022   10:49 AM 05/13/2020    9:55 AM 05/04/2019    9:44 AM 06/22/2016    9:44 AM 06/22/2016    9:21 AM  Advanced Directives  Does Patient Have a Medical Advance Directive? No No No No No No  No   Would patient like information on creating a medical advance directive? No - Patient declined  No - Patient declined No - Patient declined No - Patient declined  Yes - Educational materials given  Yes - Transport planner given      Data saved with a previous flowsheet row definition    Current Medications (verified) Outpatient Encounter Medications as of 10/04/2024  Medication Sig   acetaminophen  (TYLENOL ) 500 MG tablet Take 2 tablets (1,000 mg total) by mouth at bedtime.   Alpha Lipoic Acid 200 MG CAPS Take 600 mg by mouth daily.   Ascorbic Acid (VITAMIN C) 500 MG CAPS Take 1 each by mouth daily.   aspirin 81 MG EC tablet Take 81 mg by mouth daily.   Coenzyme Q10 (CO Q-10) 100 MG CAPS Take 200 mg by mouth daily.   cycloSPORINE  (RESTASIS ) 0.05 % ophthalmic emulsion Place 1 drop into  the right eye daily.   etodolac  (LODINE ) 500 MG tablet TAKE 1 TABLET BY MOUTH TWICE DAILY WITH A MEAL   fish oil-omega-3 fatty acids 1000 MG capsule Take 2 g by mouth daily. 1200 mg daily.   Ginkgo Biloba 60 MG CAPS Take 1 each by mouth daily.   glimepiride  (AMARYL ) 2 MG tablet Take 1 tablet (2 mg total) by mouth daily.   losartan  (COZAAR ) 25 MG tablet Take 1/2 (one-half) tablet by mouth once daily   magnesium oxide (MAG-OX)  400 (240 Mg) MG tablet Take 400 mg by mouth daily.   metFORMIN  (GLUCOPHAGE ) 500 MG tablet Take 1 tablet (500 mg total) by mouth 2 (two) times daily with a meal.   methocarbamol  (ROBAXIN ) 500 MG tablet Take 1 tablet (500 mg total) by mouth at bedtime as needed for muscle spasms.   Multiple Vitamin (MULTIVITAMIN) tablet Take 1 tablet by mouth daily.   omeprazole  (PRILOSEC) 40 MG capsule Take 1 capsule (40 mg total) by mouth 2 (two) times daily.   ondansetron  (ZOFRAN ) 4 MG tablet Take 1 tablet (4 mg total) by mouth every 8 (eight) hours as needed for nausea or vomiting.   ONETOUCH VERIO test strip USE 1 STRIP TO CHECK GLUCOSE TWICE DAILY AS NEEDED   pioglitazone  (ACTOS ) 30 MG tablet Take 1 tablet (30 mg total) by mouth daily.   pregabalin  (LYRICA ) 75 MG capsule Take 1 capsule by mouth twice daily   rosuvastatin  (CRESTOR ) 10 MG tablet TAKE ONE TABLET BY MOUTH TWO TO THREE TIMES PER WEEK   Testosterone  1.62 % GEL APPLY 2 PUMPS ONCE DAILY TO EACH SHOULDER (FOUR PUMPS TOTAL)   vardenafil  (LEVITRA ) 10 MG tablet Take 1 tablet (10 mg total) by mouth daily as needed for erectile dysfunction.   No facility-administered encounter medications on file as of 10/04/2024.    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   Complex tear of medial meniscus of left knee as current injury 01/19/2013   Diabetes mellitus without mention of complication    Esophageal reflux    Nonspecific elevation of levels of transaminase or lactic acid dehydrogenase (LDH)    Other and unspecified hyperlipidemia    Peripheral neuropathy    Personal history of urinary calculi    Unspecified essential hypertension    no meds now   Wears glasses     Past Surgical History:  Procedure Laterality Date   CHONDROPLASTY Left 01/19/2013   Procedure: CHONDROPLASTY;  Surgeon: Fonda SHAUNNA Olmsted, MD;  Location: [facility];  Service: Orthopedics;  Laterality: Left;  ARTHROSCOPY KNEE WITH DEBRIDEMENT/SHAVING (CHONDROPLASTY)   COLONOSCOPY     EYE SURGERY  [id number]   multiple lt eye reconstruction surg -glass eye   KNEE ARTHROSCOPY W/ MENISCAL REPAIR Right    2016   KNEE ARTHROSCOPY WITH MEDIAL MENISECTOMY Left 01/19/2013   Procedure: KNEE ARTHROSCOPY WITH MEDIAL MENISECTOMY;  Surgeon: Fonda SHAUNNA Olmsted, MD;  Location: [facility];  Service: Orthopedics;  Laterality: Left;  LEFT KNEE SCOPE WITH DEBRIDEMENT AND partial MEDIAL MENISECTOMY   LUMBAR DISC SURGERY  12/06/1988   l4 and l5   SHOULDER ARTHROSCOPY WITH ROTATOR CUFF REPAIR Left    SHOULDER ARTHROSCOPY WITH ROTATOR CUFF REPAIR AND SUBACROMIAL DECOMPRESSION Right 11/09/2013  Procedure: RIGHT SHOULDER ARTHROSCOPY WITH DEBRIDEMENT OF PARTIAL ROTATOR CUFF TEAR AND SUBACROMIAL DECOMPRESSION ;  Surgeon: Fonda SHAUNNA Olmsted, MD;  Location: [facility];  Service: Orthopedics;  Laterality: Right;   UPPER GI ENDOSCOPY     Family History  Problem Relation Age of Onset   Coronary artery disease Mother    Coronary artery disease Father    Peripheral vascular disease Father    Prostate cancer Father    Colon cancer Neg Hx    Social History   Socioeconomic History   Marital status: Married    Spouse name: Not on file   Number of children: 2   Years of education: 12   Highest education level: 12th grade  Occupational History   Occupation: archivist  Tobacco Use   Smoking status: Never   Smokeless tobacco: Never  Vaping Use   Vaping status: Never Used  Substance and Sexual Activity   Alcohol use: No    Alcohol/week: 0.0 standard drinks of alcohol   Drug use: No   Sexual activity: Yes    Partners: Female  Other Topics Concern   Not on file   Social History Narrative   HSG. married 7 years - divorced; remarried 1993. 1 biological child and 2 step kids.    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    Social Drivers of Corporate Investment Banker Strain: Low Risk  (10/01/2024)   Overall Financial Resource Strain (CARDIA)    Difficulty of Paying Living Expenses: Not very hard  Food Insecurity: No Food Insecurity (10/01/2024)   Hunger Vital Sign    Worried About Running Out of Food in the Last Year: Never true    Ran Out of Food in the Last Year: Never true  Transportation Needs: No Transportation Needs (10/01/2024)   PRAPARE - Administrator, Civil Service (Medical): No    Lack of Transportation (Non-Medical): No  Physical Activity: Sufficiently Active (10/01/2024)   Exercise Vital Sign    Days of Exercise per Week: 3 days    Minutes of Exercise per Session: 60 min  Stress: No Stress Concern Present (10/01/2024)   [employer] of Occupational Health - Occupational Stress Questionnaire    Feeling of Stress: Not at all  Social Connections: Socially Integrated (10/04/2024)   Social Connection and Isolation Panel    Frequency of Communication with Friends and Family: Twice a week    Frequency of Social Gatherings with Friends and Family: Once a week    Attends Religious Services: More than 4 times per year    Active Member of [employer] or Organizations: Yes    Attends Engineer, Structural: More than 4 times per year    Marital Status: Married    Tobacco Counseling Counseling given: Not Answered    Clinical Intake:  Pre-visit preparation completed: Yes  Pain : No/denies pain     BMI - recorded: 25.1 Nutritional Status: BMI 25 -29 Overweight Nutritional Risks: None Diabetes: Yes CBG done?: No Did pt. bring in CBG monitor from home?: No  Lab Results  Component Value Date   HGBA1C 6.0 (A) 06/18/2024   HGBA1C 6.4 (A) 02/16/2024   HGBA1C 7.4 (H)  10/18/2023     How often do you need to have someone help you when you read instructions, pamphlets, or other written materials from your doctor or pharmacy?: 1 - Never  Interpreter Needed?: No  Information entered by ::  Corrie Reder,CMA   Activities of Daily Living     10/01/2024    8:41 AM  In your present state of health, do you have any difficulty performing the following activities:  Hearing? 0  Vision? 0  Difficulty concentrating or making decisions? 0  Walking or climbing stairs? 0  Dressing or bathing? 0  Doing errands, shopping? 0  Preparing Food and eating ? N  Using the Toilet? N  In the past six months, have you accidently leaked urine? N  Do you have problems with loss of bowel control? N  Managing your Medications? N  Managing your Finances? N  Housekeeping or managing your Housekeeping? N    Patient Care Team: Cleatus Arlyss RAMAN, MD as PCP - General (Family Medicine) Marinus Sera, MD as Referring Physician (Ophthalmology) Josefina Chew, MD as Consulting Physician (Orthopedic Surgery) Joshua Blamer, MD as Consulting Physician (Dermatology) Cleophas Osborne LABOR, MD as Attending Physician (Ophthalmology) Celestia Agent, MD (Inactive) as Consulting Physician (Gastroenterology) Lenn Standing, MD (Ophthalmology) Fate Morna SAILOR, [facility] (Inactive) as Pharmacist (Pharmacist)  I have updated your Care Teams any recent Medical Services you may have received from other providers in the past year.     Assessment:   This is a routine wellness examination for Aarin.  Hearing/Vision screen Hearing Screening - Comments:: No difficulties Vision Screening - Comments:: Wears glasses   Goals Addressed             This Visit's Progress    Patient Stated       Better health       Depression Screen     10/04/2024   11:50 AM 06/18/2024    8:24 AM 02/16/2024    8:59 AM 10/18/2023   10:36 AM 10/03/2023   10:49 AM 06/16/2023   10:04 AM 02/10/2023    10:03 AM  PHQ 2/9 Scores  PHQ - 2 Score 0 0 0 0 0 0 0  PHQ- 9 Score 0 1  1  1  0    Fall Risk     10/01/2024    8:41 AM 06/18/2024    8:24 AM 02/16/2024    8:58 AM 10/18/2023   10:36 AM 10/03/2023   10:45 AM  Fall Risk   Falls in the past year? 0 0 0 0 0  Number falls in past yr: 0 0 0 0 0  Injury with Fall? 0 0 0 0 0  Risk for fall due to : No Fall Risks No Fall Risks No Fall Risks No Fall Risks No Fall Risks  Follow up Falls evaluation completed;Falls prevention discussed Falls evaluation completed Falls evaluation completed Falls evaluation completed Education provided;Falls prevention discussed    MEDICARE RISK AT HOME:  Medicare Risk at Home Any stairs in or around the home?: (Patient-Rptd) Yes If so, are there any without handrails?: (Patient-Rptd) No Home free of loose throw rugs in walkways, pet beds, electrical cords, etc?: (Patient-Rptd) Yes Adequate lighting in your home to reduce risk of falls?: (Patient-Rptd) Yes Life alert?: (Patient-Rptd) No Use of a cane, walker or w/c?: (Patient-Rptd) No Grab bars in the bathroom?: (Patient-Rptd) No Shower chair or bench in shower?: (Patient-Rptd) Yes Elevated toilet seat or a handicapped toilet?: (Patient-Rptd) Yes  TIMED UP AND GO:  Was the test performed?  No  Cognitive Function: 6CIT completed    05/13/2020    9:59 AM 05/04/2019    9:54 AM 06/22/2016    9:25 AM  MMSE - Mini Mental State Exam  Orientation to time  5 5 5    Orientation to Place 5 5 5    Registration 3 3 3    Attention/ Calculation 5 0 0   Recall 3 3 3    Language- name 2 objects  0 0   Language- repeat 1 1 1   Language- follow 3 step command  0 3   Language- read & follow direction  0 0   Write a sentence  0 0   Copy design  0 0   Total score  17 20      Data saved with a previous flowsheet row definition        10/04/2024   11:47 AM 10/03/2023   10:53 AM 09/28/2022   10:50 AM  6CIT Screen  What Year? 0 points 0 points 0 points  What month? 0  points 0 points 0 points  What time? 0 points 0 points 0 points  Count back from 20 0 points 0 points 0 points  Months in reverse 0 points 0 points 0 points  Repeat phrase 0 points 0 points 0 points  Total Score 0 points 0 points 0 points    Immunizations Immunization History  Administered Date(s) Administered   Fluad Quad(high Dose 65+) 09/25/2019, 09/25/2020, 10/12/2022   Fluad Trivalent(High Dose 65+) 10/18/2023   Influenza Split 10/15/2011, 10/09/2012   Influenza Whole 09/28/2002, 12/05/2007, 10/22/2008, 10/15/2010   Influenza,inj,Quad PF,6+ Mos 10/23/2013, 10/21/2014, 10/20/2015, 10/26/2016, 10/20/2017, 10/10/2018   Influenza-Unspecified 09/16/2021   PFIZER(Purple Top)SARS-COV-2 Vaccination 01/11/2020, 02/01/2020, 10/16/2020, 09/16/2021   Pneumococcal Conjugate-13 11/13/2013   Pneumococcal Polysaccharide-23 01/08/2014, 05/15/2020   Td 06/01/2006   Tdap 11/24/2017   Zoster Recombinant(Shingrix) 04/24/2021, 06/24/2021   Zoster, Live 11/26/2014    Screening Tests Health Maintenance  Topic Date Due   Influenza Vaccine  07/06/2024   COVID-19 Vaccine (5 - 2025-26 season) 08/06/2024   Diabetic kidney evaluation - eGFR measurement  10/17/2024   FOOT EXAM  10/17/2024   HEMOGLOBIN A1C  12/19/2024   OPHTHALMOLOGY EXAM  01/02/2025   Diabetic kidney evaluation - Urine ACR  02/15/2025   Medicare Annual Wellness (AWV)  10/04/2025   DTaP/Tdap/Td (3 - Td or Tdap) 11/25/2027   Colonoscopy  09/05/2030   Pneumococcal Vaccine: 50+ Years  Completed   Hepatitis C Screening  Completed   Zoster Vaccines- Shingrix  Completed   Meningococcal B Vaccine  Aged Out    Health Maintenance Items Addressed:   Additional Screening:  Vision Screening: Recommended annual ophthalmology exams for early detection of glaucoma and other disorders of the eye. Is the patient up to date with their annual eye exam?  No    Dental Screening: Recommended annual dental exams for proper oral  hygiene  Community Resource Referral / Chronic Care Management: CRR required this visit?  No   CCM required this visit?  No   Plan:    I have personally reviewed and noted the following in the patient's chart:   Medical and social history Use of alcohol, tobacco or illicit drugs  Current medications and supplements including opioid prescriptions. Patient is not currently taking opioid prescriptions. Functional ability and status Nutritional status Physical activity Advanced directives List of other physicians Hospitalizations, surgeries, and ER visits in previous 12 months Vitals Screenings to include cognitive, depression, and falls Referrals and appointments  In addition, I have reviewed and discussed with patient certain preventive protocols, quality metrics, and best practice recommendations. A written personalized care plan for preventive services as well as general preventive health recommendations were provided to patient.  Lyle MARLA Right, [place]   10/04/2024   After Visit Summary: (MyChart) Due to this being a telephonic visit, the after visit summary with patients personalized plan was offered to patient via MyChart   Notes: Nothing significant to report at this time.

## 2024-10-09 ENCOUNTER — Other Ambulatory Visit: Payer: Self-pay | Admitting: Family Medicine

## 2024-10-11 ENCOUNTER — Ambulatory Visit: Admitting: Family Medicine

## 2024-10-11 ENCOUNTER — Encounter: Payer: Self-pay | Admitting: Family Medicine

## 2024-10-11 VITALS — BP 138/60 | HR 51 | Temp 97.8°F | Ht 70.25 in | Wt 174.2 lb

## 2024-10-11 DIAGNOSIS — E1149 Type 2 diabetes mellitus with other diabetic neurological complication: Secondary | ICD-10-CM

## 2024-10-11 DIAGNOSIS — Z0000 Encounter for general adult medical examination without abnormal findings: Secondary | ICD-10-CM

## 2024-10-11 DIAGNOSIS — Z7189 Other specified counseling: Secondary | ICD-10-CM

## 2024-10-11 DIAGNOSIS — M549 Dorsalgia, unspecified: Secondary | ICD-10-CM | POA: Diagnosis not present

## 2024-10-11 DIAGNOSIS — K219 Gastro-esophageal reflux disease without esophagitis: Secondary | ICD-10-CM

## 2024-10-11 DIAGNOSIS — Z23 Encounter for immunization: Secondary | ICD-10-CM | POA: Diagnosis not present

## 2024-10-11 DIAGNOSIS — E291 Testicular hypofunction: Secondary | ICD-10-CM

## 2024-10-11 DIAGNOSIS — I10 Essential (primary) hypertension: Secondary | ICD-10-CM | POA: Diagnosis not present

## 2024-10-11 DIAGNOSIS — E785 Hyperlipidemia, unspecified: Secondary | ICD-10-CM | POA: Diagnosis not present

## 2024-10-11 LAB — LIPID PANEL
Cholesterol: 177 mg/dL (ref 0–200)
HDL: 47.7 mg/dL (ref >39.00)
LDL Cholesterol: 94 mg/dL (ref 0–99)
NonHDL: 128.88
Total CHOL/HDL Ratio: 4
Triglycerides: 174 mg/dL — ABNORMAL HIGH (ref 0.0–149.0)
VLDL: 34.8 mg/dL (ref 0.0–40.0)

## 2024-10-11 LAB — COMPREHENSIVE METABOLIC PANEL WITH GFR
ALT: 21 U/L (ref 0–53)
AST: 21 U/L (ref 0–37)
Albumin: 4.4 g/dL (ref 3.5–5.2)
Alkaline Phosphatase: 33 U/L — ABNORMAL LOW (ref 39–117)
BUN: 20 mg/dL (ref 6–23)
CO2: 31 meq/L (ref 19–32)
Calcium: 9.5 mg/dL (ref 8.4–10.5)
Chloride: 100 meq/L (ref 96–112)
Creatinine, Ser: 1 mg/dL (ref 0.40–1.50)
GFR: 76.44 mL/min (ref >60.00)
Glucose, Bld: 128 mg/dL — ABNORMAL HIGH (ref 70–99)
Potassium: 4.6 meq/L (ref 3.5–5.1)
Sodium: 139 meq/L (ref 135–145)
Total Bilirubin: 1 mg/dL (ref 0.2–1.2)
Total Protein: 7.1 g/dL (ref 6.0–8.3)

## 2024-10-11 LAB — TSH: TSH: 1.77 u[IU]/mL (ref 0.35–5.50)

## 2024-10-11 LAB — HEMOGLOBIN A1C: Hgb A1c MFr Bld: 6.4 % (ref 4.6–6.5)

## 2024-10-11 LAB — MICROALBUMIN / CREATININE URINE RATIO
Creatinine,U: 23.9 mg/dL
Microalb Creat Ratio: UNDETERMINED mg/g (ref 0.0–30.0)
Microalb, Ur: <0.7 mg/dL

## 2024-10-11 LAB — VITAMIN B12: Vitamin B-12: 612 pg/mL (ref 211–911)

## 2024-10-11 MED ORDER — ETODOLAC 500 MG PO TABS: 500.0000 mg | ORAL_TABLET | Freq: Two times a day (BID) | ORAL | 3 refills | Status: AC

## 2024-10-11 MED ORDER — GLIMEPIRIDE 2 MG PO TABS: 2.0000 mg | ORAL_TABLET | Freq: Every day | ORAL | 3 refills | Status: AC

## 2024-10-11 MED ORDER — METFORMIN HCL 500 MG PO TABS: 500.0000 mg | ORAL_TABLET | Freq: Two times a day (BID) | ORAL | 3 refills | Status: AC

## 2024-10-11 MED ORDER — ROSUVASTATIN CALCIUM 10 MG PO TABS
ORAL_TABLET | ORAL | Status: DC
Start: 2024-10-11 — End: 2024-10-11

## 2024-10-11 MED ORDER — LOSARTAN POTASSIUM 25 MG PO TABS: ORAL_TABLET | ORAL | 3 refills | Status: AC

## 2024-10-11 MED ORDER — PIOGLITAZONE HCL 30 MG PO TABS
30.0000 mg | ORAL_TABLET | Freq: Every day | ORAL | 3 refills | Status: AC
Start: 2024-10-11 — End: ?

## 2024-10-11 MED ORDER — PREGABALIN 75 MG PO CAPS: 75.0000 mg | ORAL_CAPSULE | Freq: Two times a day (BID) | ORAL | 1 refills | Status: AC

## 2024-10-11 MED ORDER — OMEPRAZOLE 40 MG PO CPDR: 40.0000 mg | DELAYED_RELEASE_CAPSULE | Freq: Two times a day (BID) | ORAL | 3 refills | Status: AC

## 2024-10-11 MED ORDER — ROSUVASTATIN CALCIUM 10 MG PO TABS: ORAL_TABLET | ORAL | 3 refills | Status: AC

## 2024-10-11 NOTE — Progress Notes (Signed)
 Diabetes:  Using medications without difficulties:see below re: statin.   Hypoglycemic episodes: rare with cautions d/w pt.   Hyperglycemic episodes: no Feet problems: still with neuropathy at baseline. Still on lyrica  and tolerating as is.  Blood Sugars averaging: variable but usually ~120-140.   eye exam within last year: yes Labs pending.   He was asking about getting his labs done here instead of urology.  I will update Dr. Twylla.  See following phone note.  Taking etodolac  and that helped with joint pain.  He failed taper and had more back pain at that point, ie off med.  Failed taper off PPI.  No ADE on med, back on med in the meantime.   Flu 2025 Shingrix done PNA 2021. Tetanus 2018 covid vaccine prev done Colonoscopy 2021 Prostate cancer screening- PSA wnl 2025 Advance directive- wife designated if patient were incapacitated. He had AAA screening at external facility.  Elevated Cholesterol: Using medications without problems: Muscle aches: some aches, had been taking crestor  10mg  3x/week.  This improved in the meantime.  Diet compliance: yes Exercise: he is trying to exercise as much as possible, stretching, yoga, light weights.   Labs pending.   PMH and SH reviewed   Meds, vitals, and allergies reviewed.    ROS: Per HPI unless specifically indicated in ROS section    GEN: nad, alert and oriented HEENT: ncat NECK: supple w/o LA CV: rrr. PULM: ctab, no inc wob ABD: soft, +bs EXT: no edema SKIN: well perfused.    Diabetic foot exam: Normal inspection except for dropped metatarsal heads noted. No skin breakdown No calluses  Normal DP pulses Normal sensation to light touch but dec to monofilament Nails normal except for L 1st nail thickened.

## 2024-10-11 NOTE — Patient Instructions (Addendum)
 Go to the lab on the way out.   If you have mychart we'll likely use that to update you.    Recheck in about 4 months.  A1c at the visit.  Schedule an early AM office visit so we can do the urology labs at the visit.   Take care.  Glad to see you.

## 2024-10-12 MED ORDER — ACCU-CHEK GUIDE TEST VI STRP: ORAL_STRIP | 12 refills | Status: DC

## 2024-10-12 MED ORDER — ACCU-CHEK GUIDE W/DEVICE KIT: 1.0000 | PACK | Freq: Once | 0 refills | Status: DC

## 2024-10-12 MED ORDER — ONETOUCH VERIO VI STRP: ORAL_STRIP | 0 refills | Status: AC

## 2024-10-12 NOTE — Telephone Encounter (Signed)
 Received fax requesting script for accu check meter and strips to local pharmacy. I have sent as requested.

## 2024-10-14 ENCOUNTER — Ambulatory Visit: Payer: Self-pay | Admitting: Family Medicine

## 2024-10-14 ENCOUNTER — Telehealth: Payer: Self-pay | Admitting: Family Medicine

## 2024-10-14 DIAGNOSIS — E291 Testicular hypofunction: Secondary | ICD-10-CM

## 2024-10-14 NOTE — Assessment & Plan Note (Signed)
 Taking etodolac  and that helped with joint pain.  He failed taper and had more back pain at that point, ie off med.  Would continue etodolac  as is.

## 2024-10-14 NOTE — Telephone Encounter (Signed)
 He was asking about getting his labs done here instead of your clinic since it is more convenient for him.  If you are okay with this, then please let me know how often you want his CBC testosterone  and PSA checked.  Thanks.

## 2024-10-14 NOTE — Assessment & Plan Note (Signed)
 Advance directive- wife designated if patient were incapacitated.

## 2024-10-14 NOTE — Assessment & Plan Note (Signed)
 Continue omeprazole

## 2024-10-14 NOTE — Assessment & Plan Note (Signed)
 Flu 2025 Shingrix done PNA 2021. Tetanus 2018 covid vaccine prev done Colonoscopy 2021 Prostate cancer screening- PSA wnl 2025 Advance directive- wife designated if patient were incapacitated. He had AAA screening at external facility.

## 2024-10-14 NOTE — Assessment & Plan Note (Signed)
 Continue Crestor  3 times a week.

## 2024-10-14 NOTE — Assessment & Plan Note (Signed)
 He was asking about getting his labs done here instead of urology.  I will update Dr. Twylla.  See following phone note.

## 2024-10-14 NOTE — Assessment & Plan Note (Signed)
 See notes on labs.  Continue glimepiride  metformin  Actos .

## 2024-10-17 NOTE — Telephone Encounter (Signed)
 Please let patient know that checked with urology.  I put in the orders for his follow-up testosterone  and CBC so he can get those done here instead of urology.  We can get the labs done at the next visit.    We can recheck his PSA yearly.  Thanks.

## 2024-10-17 NOTE — Addendum Note (Signed)
 Addended by: CLEATUS ARLYSS RAMAN on: 10/17/2024 02:30 PM   Modules accepted: Orders

## 2024-10-18 ENCOUNTER — Other Ambulatory Visit: Payer: Self-pay | Admitting: Urology

## 2024-10-18 NOTE — Telephone Encounter (Signed)
 Spoke with patient and relayed message from Dr. Cleatus about getting labs done here instead of urology. Patient verbalized understanding.

## 2024-11-05 ENCOUNTER — Other Ambulatory Visit: Payer: Self-pay | Admitting: Family Medicine

## 2024-11-05 DIAGNOSIS — E1149 Type 2 diabetes mellitus with other diabetic neurological complication: Secondary | ICD-10-CM

## 2024-11-12 NOTE — Progress Notes (Signed)
 Ian Johnson                                          MRN: [id number]   11/12/2024   The [facility] Quality Team Specialist reviewed this patient medical record for the purposes of chart review for care gap closure. The following were reviewed: abstraction for care gap closure-glycemic status assessment and kidney health evaluation for diabetes:eGFR  and uACR.    [facility]BCI Quality Team

## 2024-11-18 ENCOUNTER — Other Ambulatory Visit: Payer: Self-pay | Admitting: Urology

## 2024-12-04 ENCOUNTER — Other Ambulatory Visit: Payer: Self-pay | Admitting: *Deleted

## 2024-12-04 MED ORDER — ACCU-CHEK GUIDE TEST VI STRP: ORAL_STRIP | 12 refills | Status: AC

## 2025-01-03 ENCOUNTER — Telehealth: Payer: Self-pay

## 2025-01-03 NOTE — Telephone Encounter (Signed)
 Copied from CRM #[id number]. Topic: General - Other >> Jan 03, 2025  1:46 PM Burnard DEL wrote: Reason for CRM: Reason for CRM: [facility] called in stating that they not receive the fax that was  faxed back  to them on 12/27/2024 for patients diabetic shoes. They would like to know if information could be refaxed to them.   Fx#[phone]

## 2025-01-04 NOTE — Telephone Encounter (Signed)
Paperwork is in your box on your desk.

## 2025-01-07 NOTE — Telephone Encounter (Signed)
 I'll work on the hard copy.  Thanks.

## 2025-01-09 ENCOUNTER — Encounter: Payer: Self-pay | Admitting: Family Medicine

## 2025-02-12 ENCOUNTER — Ambulatory Visit: Admitting: Family Medicine

## 2025-03-11 ENCOUNTER — Other Ambulatory Visit

## 2025-09-06 ENCOUNTER — Other Ambulatory Visit

## 2025-09-13 ENCOUNTER — Ambulatory Visit: Admitting: Urology
